# Patient Record
Sex: Male | Born: 1965 | Race: White | Hispanic: No | Smoking: Current every day smoker
Health system: Southern US, Community
[De-identification: ages and names within clinical notes are randomized; demographics above are authoritative.]

## PROBLEM LIST (undated history)

## (undated) DIAGNOSIS — F209 Schizophrenia, unspecified: Secondary | ICD-10-CM

## (undated) DIAGNOSIS — I10 Essential (primary) hypertension: Secondary | ICD-10-CM

## (undated) DIAGNOSIS — E7800 Pure hypercholesterolemia, unspecified: Secondary | ICD-10-CM

## (undated) DIAGNOSIS — F419 Anxiety disorder, unspecified: Secondary | ICD-10-CM

---

## 2013-08-09 ENCOUNTER — Emergency Department: Payer: Self-pay | Admitting: Emergency Medicine

## 2013-08-12 LAB — BETA STREP CULTURE(ARMC)

## 2013-08-15 ENCOUNTER — Emergency Department: Payer: Self-pay | Admitting: Emergency Medicine

## 2013-09-11 ENCOUNTER — Emergency Department: Payer: Self-pay | Admitting: Emergency Medicine

## 2013-09-11 LAB — URINALYSIS, COMPLETE
BILIRUBIN, UR: NEGATIVE
Bacteria: NONE SEEN
Glucose,UR: NEGATIVE mg/dL (ref 0–75)
LEUKOCYTE ESTERASE: NEGATIVE
Nitrite: NEGATIVE
PH: 5 (ref 4.5–8.0)
Protein: NEGATIVE
RBC,UR: 1 /HPF (ref 0–5)
SQUAMOUS EPITHELIAL: NONE SEEN
Specific Gravity: 1.017 (ref 1.003–1.030)

## 2013-09-11 LAB — COMPREHENSIVE METABOLIC PANEL
AST: 61 U/L — AB (ref 15–37)
Albumin: 4 g/dL (ref 3.4–5.0)
Alkaline Phosphatase: 121 U/L — ABNORMAL HIGH
Anion Gap: 6 — ABNORMAL LOW (ref 7–16)
BUN: 10 mg/dL (ref 7–18)
Bilirubin,Total: 0.7 mg/dL (ref 0.2–1.0)
CO2: 30 mmol/L (ref 21–32)
Calcium, Total: 9 mg/dL (ref 8.5–10.1)
Chloride: 99 mmol/L (ref 98–107)
Creatinine: 0.81 mg/dL (ref 0.60–1.30)
EGFR (African American): >60
EGFR (Non-African Amer.): >60
Glucose: 76 mg/dL (ref 65–99)
Osmolality: 268 (ref 275–301)
POTASSIUM: 4.1 mmol/L (ref 3.5–5.1)
SGPT (ALT): 33 U/L (ref 12–78)
SODIUM: 135 mmol/L — AB (ref 136–145)
Total Protein: 7.5 g/dL (ref 6.4–8.2)

## 2013-09-11 LAB — ETHANOL: Ethanol: <3 mg/dL

## 2013-09-11 LAB — SALICYLATE LEVEL: Salicylates, Serum: 3.4 mg/dL — ABNORMAL HIGH

## 2013-09-11 LAB — DRUG SCREEN, URINE

## 2013-09-11 LAB — CBC
HCT: 42.6 % (ref 40.0–52.0)
HGB: 14.1 g/dL (ref 13.0–18.0)
MCH: 30.3 pg (ref 26.0–34.0)
MCHC: 33 g/dL (ref 32.0–36.0)
MCV: 92 fL (ref 80–100)
Platelet: 284 10*3/uL (ref 150–440)
RBC: 4.64 10*6/uL (ref 4.40–5.90)
RDW: 14.5 % (ref 11.5–14.5)
WBC: 8.9 10*3/uL (ref 3.8–10.6)

## 2013-09-11 LAB — ACETAMINOPHEN LEVEL

## 2013-09-15 ENCOUNTER — Emergency Department: Payer: Self-pay | Admitting: Emergency Medicine

## 2013-09-15 LAB — URINALYSIS, COMPLETE
BACTERIA: NONE SEEN
BLOOD: NEGATIVE
Bilirubin,UR: NEGATIVE
Glucose,UR: NEGATIVE mg/dL (ref 0–75)
KETONE: NEGATIVE
Leukocyte Esterase: NEGATIVE
Nitrite: NEGATIVE
PH: 8 (ref 4.5–8.0)
Protein: NEGATIVE
RBC, UR: NONE SEEN /HPF (ref 0–5)
SPECIFIC GRAVITY: 1.008 (ref 1.003–1.030)
SQUAMOUS EPITHELIAL: NONE SEEN
WBC UR: NONE SEEN /HPF (ref 0–5)

## 2013-09-15 LAB — DRUG SCREEN, URINE
Amphetamines, Ur Screen: NEGATIVE (ref ?–1000)
Barbiturates, Ur Screen: NEGATIVE (ref ?–200)
Benzodiazepine, Ur Scrn: NEGATIVE (ref ?–200)
Cannabinoid 50 Ng, Ur ~~LOC~~: NEGATIVE (ref ?–50)
Cocaine Metabolite,Ur ~~LOC~~: NEGATIVE (ref ?–300)
MDMA (ECSTASY) UR SCREEN: NEGATIVE (ref ?–500)
METHADONE, UR SCREEN: NEGATIVE (ref ?–300)
OPIATE, UR SCREEN: NEGATIVE (ref ?–300)
Phencyclidine (PCP) Ur S: NEGATIVE (ref ?–25)
TRICYCLIC, UR SCREEN: NEGATIVE (ref ?–1000)

## 2013-09-15 LAB — ETHANOL
Ethanol %: <0.003 % (ref 0.000–0.080)
Ethanol: <3 mg/dL

## 2013-09-15 LAB — COMPREHENSIVE METABOLIC PANEL
ALT: 31 U/L (ref 12–78)
ANION GAP: 3 — AB (ref 7–16)
Albumin: 3.8 g/dL (ref 3.4–5.0)
Alkaline Phosphatase: 133 U/L — ABNORMAL HIGH
BUN: 7 mg/dL (ref 7–18)
Bilirubin,Total: 0.6 mg/dL (ref 0.2–1.0)
CALCIUM: 9.2 mg/dL (ref 8.5–10.1)
CO2: 31 mmol/L (ref 21–32)
CREATININE: 0.87 mg/dL (ref 0.60–1.30)
Chloride: 99 mmol/L (ref 98–107)
EGFR (Non-African Amer.): >60
Glucose: 95 mg/dL (ref 65–99)
Osmolality: 264 (ref 275–301)
Potassium: 4.3 mmol/L (ref 3.5–5.1)
SGOT(AST): 33 U/L (ref 15–37)
Sodium: 133 mmol/L — ABNORMAL LOW (ref 136–145)
Total Protein: 7.6 g/dL (ref 6.4–8.2)

## 2013-09-15 LAB — CBC
HCT: 44.5 % (ref 40.0–52.0)
HGB: 14.4 g/dL (ref 13.0–18.0)
MCH: 29.6 pg (ref 26.0–34.0)
MCHC: 32.2 g/dL (ref 32.0–36.0)
MCV: 92 fL (ref 80–100)
Platelet: 350 10*3/uL (ref 150–440)
RBC: 4.85 10*6/uL (ref 4.40–5.90)
RDW: 14 % (ref 11.5–14.5)
WBC: 6.1 10*3/uL (ref 3.8–10.6)

## 2013-09-15 LAB — ACETAMINOPHEN LEVEL

## 2013-09-15 LAB — SALICYLATE LEVEL: Salicylates, Serum: 2.4 mg/dL

## 2013-09-17 ENCOUNTER — Emergency Department: Payer: Self-pay | Admitting: Emergency Medicine

## 2013-09-17 LAB — URINALYSIS, COMPLETE
BLOOD: NEGATIVE
Bacteria: NONE SEEN
Bilirubin,UR: NEGATIVE
GLUCOSE, UR: NEGATIVE mg/dL (ref 0–75)
Leukocyte Esterase: NEGATIVE
Nitrite: NEGATIVE
PH: 6 (ref 4.5–8.0)
PROTEIN: NEGATIVE
SQUAMOUS EPITHELIAL: NONE SEEN
Specific Gravity: 1.012 (ref 1.003–1.030)
WBC UR: 2 /HPF (ref 0–5)

## 2013-09-17 LAB — DRUG SCREEN, URINE

## 2013-09-17 LAB — CBC
HCT: 43.5 % (ref 40.0–52.0)
HGB: 14.2 g/dL (ref 13.0–18.0)
MCH: 30.3 pg (ref 26.0–34.0)
MCHC: 32.6 g/dL (ref 32.0–36.0)
MCV: 93 fL (ref 80–100)
Platelet: 365 10*3/uL (ref 150–440)
RBC: 4.69 10*6/uL (ref 4.40–5.90)
RDW: 13.9 % (ref 11.5–14.5)
WBC: 7.4 10*3/uL (ref 3.8–10.6)

## 2013-09-17 LAB — ETHANOL
Ethanol %: <0.003 % (ref 0.000–0.080)
Ethanol: <3 mg/dL

## 2013-09-17 LAB — COMPREHENSIVE METABOLIC PANEL
ALBUMIN: 3.7 g/dL (ref 3.4–5.0)
ALK PHOS: 124 U/L — AB
Anion Gap: 5 — ABNORMAL LOW (ref 7–16)
BUN: 8 mg/dL (ref 7–18)
Bilirubin,Total: 0.6 mg/dL (ref 0.2–1.0)
CALCIUM: 8.8 mg/dL (ref 8.5–10.1)
CO2: 25 mmol/L (ref 21–32)
Chloride: 101 mmol/L (ref 98–107)
Creatinine: 0.88 mg/dL (ref 0.60–1.30)
EGFR (African American): >60
Glucose: 96 mg/dL (ref 65–99)
OSMOLALITY: 261 (ref 275–301)
POTASSIUM: 4 mmol/L (ref 3.5–5.1)
SGOT(AST): 30 U/L (ref 15–37)
SGPT (ALT): 29 U/L (ref 12–78)
SODIUM: 131 mmol/L — AB (ref 136–145)
Total Protein: 7.4 g/dL (ref 6.4–8.2)

## 2013-09-17 LAB — ACETAMINOPHEN LEVEL

## 2013-09-17 LAB — SALICYLATE LEVEL: SALICYLATES, SERUM: 3.2 mg/dL — AB

## 2014-06-06 ENCOUNTER — Emergency Department (HOSPITAL_COMMUNITY)
Admission: EM | Admit: 2014-06-06 | Discharge: 2014-06-07 | Disposition: A | Discharge status: Home / Self Care | Payer: Self-pay | Attending: Emergency Medicine | Admitting: Emergency Medicine

## 2014-06-06 ENCOUNTER — Encounter (HOSPITAL_COMMUNITY): Payer: Self-pay

## 2014-06-06 DIAGNOSIS — R440 Auditory hallucinations: Secondary | ICD-10-CM | POA: Insufficient documentation

## 2014-06-06 DIAGNOSIS — Z880 Allergy status to penicillin: Secondary | ICD-10-CM | POA: Insufficient documentation

## 2014-06-06 DIAGNOSIS — Z9114 Patient's other noncompliance with medication regimen: Secondary | ICD-10-CM | POA: Insufficient documentation

## 2014-06-06 DIAGNOSIS — F200 Paranoid schizophrenia: Secondary | ICD-10-CM

## 2014-06-06 DIAGNOSIS — R441 Visual hallucinations: Secondary | ICD-10-CM | POA: Insufficient documentation

## 2014-06-06 LAB — CBC
HEMATOCRIT: 45.9 % (ref 39.0–52.0)
HEMOGLOBIN: 15.6 g/dL (ref 13.0–17.0)
MCH: 31 pg (ref 26.0–34.0)
MCHC: 34 g/dL (ref 30.0–36.0)
MCV: 91.3 fL (ref 78.0–100.0)
Platelets: 297 10*3/uL (ref 150–400)
RBC: 5.03 MIL/uL (ref 4.22–5.81)
RDW: 13.3 % (ref 11.5–15.5)
WBC: 8.6 10*3/uL (ref 4.0–10.5)

## 2014-06-06 LAB — COMPREHENSIVE METABOLIC PANEL
ALT: 24 U/L (ref 0–53)
ANION GAP: 8 (ref 5–15)
AST: 26 U/L (ref 0–37)
Albumin: 4.6 g/dL (ref 3.5–5.2)
Alkaline Phosphatase: 105 U/L (ref 39–117)
BILIRUBIN TOTAL: 0.6 mg/dL (ref 0.3–1.2)
BUN: 14 mg/dL (ref 6–23)
CALCIUM: 9.4 mg/dL (ref 8.4–10.5)
CO2: 28 mmol/L (ref 19–32)
Chloride: 103 mmol/L (ref 96–112)
Creatinine, Ser: 1.02 mg/dL (ref 0.50–1.35)
GFR calc Af Amer: >90 mL/min (ref >90)
GFR, EST NON AFRICAN AMERICAN: 85 mL/min — AB (ref >90)
Glucose, Bld: 147 mg/dL — ABNORMAL HIGH (ref 70–99)
POTASSIUM: 3.9 mmol/L (ref 3.5–5.1)
Sodium: 139 mmol/L (ref 135–145)
TOTAL PROTEIN: 7.3 g/dL (ref 6.0–8.3)

## 2014-06-06 LAB — ACETAMINOPHEN LEVEL: Acetaminophen (Tylenol), Serum: <10 ug/mL — ABNORMAL LOW (ref 10–30)

## 2014-06-06 LAB — RAPID URINE DRUG SCREEN, HOSP PERFORMED
Amphetamines: NOT DETECTED
Barbiturates: NOT DETECTED
Benzodiazepines: NOT DETECTED
Cocaine: NOT DETECTED
OPIATES: NOT DETECTED
Tetrahydrocannabinol: NOT DETECTED

## 2014-06-06 LAB — ETHANOL: Alcohol, Ethyl (B): <5 mg/dL (ref 0–9)

## 2014-06-06 LAB — SALICYLATE LEVEL: Salicylate Lvl: <4 mg/dL (ref 2.8–20.0)

## 2014-06-06 MED ORDER — NICOTINE 21 MG/24HR TD PT24: 21.0000 mg | MEDICATED_PATCH | Freq: Every day | TRANSDERMAL | Status: DC

## 2014-06-06 MED ORDER — LORAZEPAM 1 MG PO TABS: 1.0000 mg | ORAL_TABLET | Freq: Three times a day (TID) | ORAL | Status: DC | PRN

## 2014-06-06 MED ORDER — ZOLPIDEM TARTRATE 5 MG PO TABS: 5.0000 mg | ORAL_TABLET | Freq: Every evening | ORAL | Status: DC | PRN

## 2014-06-06 MED ORDER — ALUM & MAG HYDROXIDE-SIMETH 200-200-20 MG/5ML PO SUSP: 30.0000 mL | ORAL | Status: DC | PRN

## 2014-06-06 MED ORDER — PERPHENAZINE 8 MG PO TABS
8.0000 mg | ORAL_TABLET | Freq: Two times a day (BID) | ORAL | Status: DC
  Administered 2014-06-06 – 2014-06-07 (×3): 8 mg via ORAL
  Filled 2014-06-06 (×5): qty 1

## 2014-06-06 MED ORDER — ONDANSETRON HCL 4 MG PO TABS: 4.0000 mg | ORAL_TABLET | Freq: Three times a day (TID) | ORAL | Status: DC | PRN

## 2014-06-06 MED ORDER — NICOTINE POLACRILEX 2 MG MT GUM: 2.0000 mg | CHEWING_GUM | OROMUCOSAL | Status: DC | PRN

## 2014-06-06 MED ORDER — BENZTROPINE MESYLATE 1 MG PO TABS
1.0000 mg | ORAL_TABLET | Freq: Two times a day (BID) | ORAL | Status: DC
  Administered 2014-06-06 – 2014-06-07 (×3): 1 mg via ORAL
  Filled 2014-06-06 (×3): qty 1

## 2014-06-06 MED ORDER — ACETAMINOPHEN 325 MG PO TABS: 650.0000 mg | ORAL_TABLET | ORAL | Status: DC | PRN

## 2014-06-06 MED ORDER — TRAZODONE HCL 100 MG PO TABS
100.0000 mg | ORAL_TABLET | Freq: Every day | ORAL | Status: DC
  Administered 2014-06-06: 100 mg via ORAL
  Filled 2014-06-06: qty 1

## 2014-06-06 MED ORDER — HYDROXYZINE HCL 25 MG PO TABS
50.0000 mg | ORAL_TABLET | Freq: Three times a day (TID) | ORAL | Status: DC | PRN
Start: 2014-06-06 — End: 2014-06-07

## 2014-06-06 NOTE — Progress Notes (Addendum)
  CARE MANAGEMENT ED NOTE 06/06/2014  Patient:  Kenneth [name],Kenneth [name]   Account Number:  [id number]  Date Initiated:  06/06/2014  Documentation initiated by:  Edd ArbourGIBBS,Laroy Mustard  Subjective/Objective Assessment:   49 yr old self pay [place] pt hearing voices and seeing things, pt denies SI at this time Pt confirmed with CM he was recently released from jail 5 days ago and has no pcp        Subjective/Objective Assessment Detail:   no pcp listed in EPIC  PMH: paranoid shizophrenia  Quality collaborative recommendations; Injectable medicines weekly and community resources  No home medicine listed     Action/Plan:   see resources listed below given resources for [place]   Action/Plan Detail:   Anticipated DC Date:       Status Recommendation to Physician:   Result of Recommendation:    Other ED Services  Consult Working Plan    DC Planning Services  Outpatient Services - Pt will follow up  Other  PCP issues    Choice offered to / List presented to:            Status of service:  Completed, signed off  ED Comments:   ED Comments Detail:  [name] Location: [place], [place]- [id number] Contact Phone: [phone] Services: Dental Care Services, Enabling Services, Obstetrical and Gynecological Care, Primary Medical Care Remarks: Year round  [name] Location: [place], [place] -- [id number] Contact Phone: [phone] Services: Child, Teen, Adult and [facility], [facility], Wellness, Prenatal Care and [employer] Planning, Physical Exams (school, employment, sports, nursing home, etc.), Chronic Illness, Minor Trauma, Immunizations, Flu Shots, Stage managerLaboratory Services On-Site, [place] Remarks: Community Health, Urban [facility], [facility], Year-Round, Full-Time (open 52 hours per week)  [facility] Location: [place], [place]- [id number] Contact Phone: [phone] Services: Medical Services Remarks: Hours Of Operation: Tuesday:  4:15 PM to 8:00 PM, Thursday: 4:15 PM to 8:00 PM; Eye Clinic Hours: Wednesday 9:00AM - 12:00PM, Thursday 1:00PM - 5:00PM - See more at: [email or web address]_care/[email or web address]#[email or web address]  CM discussed and provided written information for self pay pcps, importance of pcp for f/u care, www.needymeds.org, www.goodrx.com, discounted pharmacies and other [place] resources such as [facility], [facility], affordable care act, financial assistance, DSS and  health department  Reviewed resources for [place] self pay pcps like [facility], family medicine at [address], [facility], general medical clinics, [facility] plus others, medication resources, [facility] out patient pharmacies and housing Pt voiced understanding and appreciation of resources provided  Pt informed Cm he had "[facility] at [facility]" " I can't go back there" " I'[name] trying to get to [place]." after pt discussed [facility] and [facility] Pt reports [facility] an appointment at [facility] [place] "today but missed it"

## 2014-06-06 NOTE — BH Assessment (Signed)
Assessment Note  Kenneth MasseBenjamin M [name] is an 49 y.o. male with a history of paranoid schizophrenia who [facility]. Patient states he has been experiencing hallucinations over the past 3 days. He has been noncompliant with his medications over this period of time. Patient stopped taking medications without reasoning.  He states he was just released from prison 5 days ago after being incarcerated for 16 years. He states that he saw a psychiatrist while incarcerated. He endorses active auditory hallucinations telling him that this is a "black mafia hospital". The voices tell him to harm himself and others. Patient unable to distinguish auditory hallucinations vs. reality. Patient sts that he feels more so paranoid if anything. He is afraid that someone with attack him. He states that he drank 9 beers 2 days ago. He also used marijuana. He denies suicidal or homicidal thoughts currently. Patient reports that he has a hx of suicidal attempt (overdose). Although he denies current HI patient felt homicidal yesterday (no plan/victim).  Patient sts that he was hospitalized for mental health reasons in the past 1x.   Axis I: Paranoid Schizophrenia Axis II: Deferred Axis III: History reviewed. No pertinent past medical history. Axis IV: other psychosocial or environmental problems, problems related to social environment, problems with access to health care services and problems with primary support group Axis V: 31-40 impairment in reality testing  Past Medical History: History reviewed. No pertinent past medical history.  History reviewed. No pertinent past surgical history.  Family History: History reviewed. No pertinent family history.  Social History:  reports that he has never smoked. He does not have any smokeless tobacco history on file. He reports that he does not drink alcohol. His drug history is not on file.  Additional Social History:  Alcohol / Drug Use Pain  Medications: SEE MAR Prescriptions: SEE MAR Over the Counter: SEE MAR History of alcohol / drug use?: Yes Longest period of sobriety (when/how long): 16 yrs  Substance #1 Name of Substance 1: Alcohol  1 - Age of First Use: "I was young" 1 - Amount (size/oz): several beers 1 - Frequency: 1x in the past several days (patient incarcerated for the past 16 yrs) 1 - Duration: on-going  1 - Last Use / Amount: 06/04/2014 patient reports drinking 9 beers  Substance #2 Name of Substance 2: THC  2 - Age of First Use: 49 yrs old  2 - Amount (size/oz): varies 2 - Frequency: 1x in the past several days (patient incarcerated for the past 16 yrs) 2 - Duration: on-going  2 - Last Use / Amount: 06/04/2014   CIWA: CIWA-Ar BP: 140/91 mmHg Pulse Rate: 98 COWS:    Allergies:  Allergies  Allergen Reactions  . Haldol [Haloperidol] Other (See Comments)    "lock up"  . Loxapine     "Locks ups"  . Penicillins Other (See Comments)    Neck swells    Home Medications:  (Not in a hospital admission)  OB/GYN Status:  No LMP for male patient.  General Assessment Data [facility] of [facility]: [facility] ([facility]) Is this a Tele or Face-to-Face Assessment?: Face-to-Face Is this an Initial Assessment or a Re-assessment for this encounter?: Initial Assessment Living Arrangements: Other (Comment) Stage manager([facility]) Can pt return to current living arrangement?: No Admission Status: Voluntary Is patient capable of signing voluntary admission?: Yes Transfer from: Acute Hospital Referral Source: Self/Family/Friend     [facility] Living Arrangements: Other (Comment) Stage manager([facility]) Name of Psychiatrist:  (  None reported ) Name of Therapist:  (None reported )  Education Status Is patient currently in school?: No  Risk to self with the past 6 months Suicidal Ideation: No Suicidal Intent: No Is patient at risk for suicide?: No Suicidal Plan?: No Access to Means: No What has been your use of  drugs/alcohol within the last 12 months?:  (patient reports recent THC and alcohol use) Previous Attempts/Gestures: Yes How many times?:  (1x-overdose (2010)) Other Self Harm Risks:  (cutting by history ) Triggers for Past Attempts: Other (Comment) (auditory hallucinations ) Intentional Self Injurious Behavior: Cutting (cutting by hx) Comment - Self Injurious Behavior:  (cutting by hx) Family Suicide History:  (mother-depression and Bipolar ) Recent stressful life event(s): Other (Comment) (pt released from prison last Thursday after serving 81yrs) Persecutory voices/beliefs?: No Depression: Yes Depression Symptoms: Loss of interest in usual pleasures, Fatigue, Despondent Substance abuse history and/or treatment for substance abuse?: No Suicide prevention information given to non-admitted patients: Not applicable  Risk to Others within the past 6 months Homicidal Ideation: No-Not Currently/Within Last 6 Months (Patient reports HI yesterday 06/05/2013) Thoughts of Harm to Others: No-Not Currently Present/Within Last 6 Months Current Homicidal Intent: No Current Homicidal Plan: No Access to Homicidal Means: No Identified Victim:  (patient denies victim) History of harm to others?:  ("I will harm someone if they try to harm me") Assessment of Violence: None Noted Violent Behavior Description:  (patient calm and cooperative .) Does patient have access to weapons?: No Criminal Charges Pending?: No Describe Pending Criminal Charges:  (released from prison last Thursday after serving 16 yrs ) Does patient have a court date: No  Psychosis Hallucinations: Auditory, With command ("Voices tell me to harm others and myself") Delusions: None noted  Mental Status Report Appear/Hygiene: Disheveled Eye Contact: Good Motor Activity: Freedom of movement Speech: Logical/coherent Level of Consciousness: Alert Mood: Depressed Affect: Appropriate to circumstance Anxiety Level: None Thought  Processes: Coherent, Relevant Judgement: Impaired Orientation: Person, Place, Situation, Time Obsessive Compulsive Thoughts/Behaviors: None  Cognitive Functioning Concentration: Normal Memory: Remote Impaired, Recent Intact IQ: Average Insight: Poor Impulse Control: Poor Appetite: Good Weight Loss:  (none reported ) Weight Gain:  (none reported ) Sleep: Decreased Total Hours of Sleep:  ("I haven't slept in 3 days") Vegetative Symptoms: None  ADLScreening [facility] Assessment Services) Patient's cognitive ability adequate to safely complete daily activities?: Yes Patient able to express need for assistance with ADLs?: Yes Independently performs ADLs?: Yes (appropriate for developmental age)  Prior Inpatient Therapy Prior Inpatient Therapy: Yes Prior Therapy Dates:  (patient unable to recall date; pt reports 1 prior hospitaliz) Prior Therapy Facilty/Provider(s):  (patient unable to recall ) Reason for Treatment:  (overdose and psychosis; medication managment )  Prior Outpatient Therapy Prior Outpatient Therapy: No Prior Therapy Dates:  (n/a) Prior Therapy Facilty/Provider(s):  (n/a) Reason for Treatment:  (n/a)  ADL Screening (condition at time of admission) Patient's cognitive ability adequate to safely complete daily activities?: Yes Is the patient deaf or have difficulty hearing?: No Does the patient have difficulty seeing, even when wearing glasses/contacts?: No Does the patient have difficulty concentrating, remembering, or making decisions?: Yes Patient able to express need for assistance with ADLs?: Yes Does the patient have difficulty dressing or bathing?: No Independently performs ADLs?: Yes (appropriate for developmental age) Does the patient have difficulty walking or climbing stairs?: No Weakness of Legs: None Weakness of Arms/Hands: None  Home Assistive Devices/Equipment Home Assistive Devices/Equipment: None    Abuse/Neglect Assessment (Assessment to be  complete while patient is alone) Physical Abuse: Denies Verbal Abuse: Denies Sexual Abuse: Denies Exploitation of patient/patient's resources: Denies Self-Neglect: Denies Values / Beliefs Cultural Requests During Hospitalization: None Spiritual Requests During Hospitalization: None   Advance Directives (For Healthcare) Does patient have an advance directive?: No Would patient like information on creating an advanced directive?: No - patient declined information    Additional Information 1:1 In Past 12 Months?: No CIRT Risk: No Elopement Risk: No Does patient have medical clearance?: Yes     Disposition:  Disposition Initial Assessment Completed for this Encounter: Yes Disposition of Patient: Other dispositions (Pending psych consult by Julieanne Cotton, NP and Dr. Jannifer Franklin)  On Site Evaluation by:   Reviewed with Physician:    Melynda Ripple [facility] 06/06/2014 9:25 AM

## 2014-06-06 NOTE — ED Provider Notes (Signed)
CSN: [id number]     Arrival date & time 06/06/14  0053 History   First MD Initiated Contact with Patient 06/06/14 0126     Chief Complaint  Patient presents with  . Hallucinations    (Consider location/radiation/quality/duration/timing/severity/associated sxs/prior Treatment) HPI Comments: Patient is a 49 year old male with a history of paranoid schizophrenia who presents to the emergency department for further evaluation of auditory and visual hallucinations. Patient states he has been experiencing hallucinations over the past 3 days. He has been noncompliant with his medications over this period of time. He states he was just released from prison 5 days ago after being incarcerated for 5 years. He states that he saw a psychiatrist while incarcerated. He endorses active auditory hallucinations telling him that this is a "black mafia hospital". He states that he drank beer 2 days ago. She denies the use of illicit drugs as well as any suicidal or homicidal thoughts. He denies command hallucinations.  The history is provided by the patient. No language interpreter was used.    History reviewed. No pertinent past medical history. History reviewed. No pertinent past surgical history. History reviewed. No pertinent family history. History  Substance Use Topics  . Smoking status: Never Smoker   . Smokeless tobacco: Not on file  . Alcohol Use: No    Review of Systems  Psychiatric/Behavioral: Positive for hallucinations and behavioral problems. Negative for suicidal ideas.  All other systems reviewed and are negative.   Allergies  Haldol; Loxapine; and Penicillins  Home Medications   Prior to Admission medications   Not on File   BP 140/91 mmHg  Pulse 98  Temp(Src) 98.1 F (36.7 C) (Oral)  Resp 20  SpO2 97%   Physical Exam  Constitutional: He is oriented to person, place, and time. He appears well-developed and well-nourished. No distress.  Nontoxic/nonseptic appearing  HENT:   Head: Normocephalic and atraumatic.  Eyes: Conjunctivae and EOM are normal. No scleral icterus.  Neck: Normal range of motion.  Pulmonary/Chest: Effort normal. No respiratory distress.  Respirations even and unlabored  Musculoskeletal: Normal range of motion.  Neurological: He is alert and oriented to person, place, and time. He exhibits normal muscle tone. Coordination normal.  GCS 15. Patient speaks in full goal oriented sentences. He answers questions appropriately and follows simple commands. No focal neurologic deficits noted.  Skin: Skin is warm and dry. No rash noted. He is not diaphoretic. No erythema. No pallor.  Psychiatric: His speech is normal. His mood appears anxious. He is agitated and actively hallucinating. Thought content is paranoid. He expresses no homicidal and no suicidal ideation. He expresses no suicidal plans and no homicidal plans.  Nursing note and vitals reviewed.   ED Course  Procedures (including critical care time) Labs Review Labs Reviewed  ACETAMINOPHEN LEVEL - Abnormal; Notable for the following:    Acetaminophen (Tylenol), Serum <10.0 (*)    All other components within normal limits  COMPREHENSIVE METABOLIC PANEL - Abnormal; Notable for the following:    Glucose, Bld 147 (*)    GFR calc non Af Amer 85 (*)    All other components within normal limits  CBC  ETHANOL  SALICYLATE LEVEL  URINE RAPID DRUG SCREEN (HOSP PERFORMED)    Imaging Review No results found.   EKG Interpretation None      MDM   Final diagnoses:  Auditory hallucinations  Visual hallucinations  Noncompliance with medication regimen    49 year old male who endorses a history of paranoid schizophrenia presents to the  emergency department for further evaluation of auditory and visual hallucinations. He states that he has been off of his medications for the last 3 days and has also been feeling more paranoid. He denies SI/HI. He has been medically cleared and is currently  pending TTS evaluation. Disposition to be determined by oncoming ED provider. Psych hold orders placed.   Filed Vitals:   06/06/14 0114 06/06/14 0641  BP: 164/105 140/91  Pulse: 101 98  Temp: 97.8 F (36.6 C) 98.1 F (36.7 C)  TempSrc:  Oral  Resp: 24 20  SpO2: 98% 97%       Antony Madura, PA-C 06/06/14 1610  Derwood Kaplan, MD 06/07/14 2201

## 2014-06-06 NOTE — BH Assessment (Addendum)
[facility] Assessment Progress Note  Per Thedore MinsMojeed Akintayo, MD, pt requires psychiatric hospitalization.  This Clinical research associatewriter has spoken to Berneice Heinrichina Tate, RN, [facility] to ask that [facility] with decision pending at this time.  The following facilities have been contacted in an effort to place this pt, with results as noted:  *[facility]: beds are available and they have agreed to consider pt for admission; decision pending at this time *[facility]: beds are available; referral faxed with decision pending *[facility]: beds are available; referral faxed with decision pending *[facility]: beds are available; referral faxed with decision pending *[facility]: call placed at 12:58; rolled to voice mail; left message *[facility]: beds are available; referral faxed with decision pending   Kenneth Canninghomas Kenneth Woolston, MA Triage Specialist 06/06/2014 @ 16:35

## 2014-06-06 NOTE — Consult Note (Signed)
[facility] Face-to-Face Psychiatry Consult   Reason for Consult:  Hallucinations, Paranoia Referring Physician:  EDP Patient Identification: Kenneth [name] MRN:  [id number] Principal Diagnosis: Schizophrenia, paranoid type Diagnosis:   Patient Active Problem List   Diagnosis Date Noted  . Schizophrenia, paranoid type [F20.0] 06/06/2014    Total Time spent with patient: 45 minutes  Subjective:   Kenneth [name] is a 49 y.o. male patient admitted with Paranoia and Auditory/Visual Hallucination  HPI:  Caucasian male was assessed this am, brought in by [facility] for endorsing auditory and visual hallucination.  Patient has a hx of Schizophrenia and was receiving treatment while incarcerated.  Patient reported stopping his Psychotropic medication for three days.  Patient did not give reasons for stopping his medications.  Patient is hearing voices telling him to hurt self or somebody.   Patient stated that he saw "GOD dying last night" and stated that God is a beautiful man.   Patient spent 16 years in the Prison system and is homeless now.  He is unemployed and reports no source of income.  Patient reported poor sleep and appetite.  He reports no family or assistance since he left the prison.  We have accepted patient for admission and will be looking for bed at ant facility with available inpatient Psychiatric unit.    He denied SI/HI and we have restarted his medication.  HPI Elements:   Location:  Chronic paranoid Schizophrenia, . Quality:  C/O Auditory/visual hallucination.. Severity:  severe. Timing:  on going, stopped taking his medications. Duration:  Chronic. Context:  Brought in for evaluation of AVH.Marland Kitchen  Past Medical History: History reviewed. No pertinent past medical history. History reviewed. No pertinent past surgical history. Family History: History reviewed. No pertinent family history. Social History:  History  Alcohol Use No     History  Drug Use Not on file    History    Social History  . Marital Status: Married    Spouse Name: N/A    Number of Children: N/A  . Years of Education: N/A   Social History Main Topics  . Smoking status: Never Smoker   . Smokeless tobacco: None  . Alcohol Use: No  . Drug Use: None  . Sexual Activity: None   Other Topics Concern  . None   Social History Narrative  . None   Additional Social History:    Pain Medications: SEE MAR Prescriptions: SEE MAR Over the Counter: SEE MAR History of alcohol / drug use?: Yes Longest period of sobriety (when/how long): 16 yrs  Name of Substance 1: Alcohol  1 - Age of First Use: "I was young" 1 - Amount (size/oz): several beers 1 - Frequency: 1x in the past several days (patient incarcerated for the past 16 yrs) 1 - Duration: on-going  1 - Last Use / Amount: 06/04/2014 patient reports drinking 9 beers  Name of Substance 2: THC  2 - Age of First Use: 49 yrs old  2 - Amount (size/oz): varies 2 - Frequency: 1x in the past several days (patient incarcerated for the past 16 yrs) 2 - Duration: on-going  2 - Last Use / Amount: 06/04/2014                  Allergies:   Allergies  Allergen Reactions  . Haldol [Haloperidol] Other (See Comments)    "lock up"  . Loxapine     "Locks ups"  . Penicillins Other (See Comments)    Neck swells  Vitals: Blood pressure 107/79, pulse 93, temperature 98.4 F (36.9 C), temperature source Oral, resp. rate 20, SpO2 98 %.  Risk to Self: Suicidal Ideation: No Suicidal Intent: No Is patient at risk for suicide?: No Suicidal Plan?: No Access to Means: No What has been your use of drugs/alcohol within the last 12 months?:  (patient reports recent THC and alcohol use) How many times?:  (1x-overdose (2010)) Other Self Harm Risks:  (cutting by history ) Triggers for Past Attempts: Other (Comment) (auditory hallucinations ) Intentional Self Injurious Behavior: Cutting (cutting by hx) Comment - Self Injurious Behavior:  (cutting by  hx) Risk to Others: Homicidal Ideation: No-Not Currently/Within Last 6 Months (Patient reports HI yesterday 06/05/2013) Thoughts of Harm to Others: No-Not Currently Present/Within Last 6 Months Current Homicidal Intent: No Current Homicidal Plan: No Access to Homicidal Means: No Identified Victim:  (patient denies victim) History of harm to others?:  ("I will harm someone if they try to harm me") Assessment of Violence: None Noted Violent Behavior Description:  (patient calm and cooperative .) Does patient have access to weapons?: No Criminal Charges Pending?: No Describe Pending Criminal Charges:  (released from prison last Thursday after serving 16 yrs ) Does patient have a court date: No Prior Inpatient Therapy: Prior Inpatient Therapy: Yes Prior Therapy Dates:  (patient unable to recall date; pt reports 1 prior hospitaliz) Prior Therapy Facilty/Provider(s):  (patient unable to recall ) Reason for Treatment:  (overdose and psychosis; medication managment ) Prior Outpatient Therapy: Prior Outpatient Therapy: No Prior Therapy Dates:  (n/a) Prior Therapy Facilty/Provider(s):  (n/a) Reason for Treatment:  (n/a)  Current Facility-Administered Medications  Medication Dose Route Frequency Provider Last Rate Last Dose  . acetaminophen (TYLENOL) tablet 650 mg  650 mg Oral Q4H PRN Antony MaduraKelly Humes, PA-C      . alum & mag hydroxide-simeth (MAALOX/MYLANTA) 200-200-20 MG/5ML suspension 30 mL  30 mL Oral PRN Antony MaduraKelly Humes, PA-C      . benztropine (COGENTIN) tablet 1 mg  1 mg Oral BID Jobina Maita   1 mg at 06/06/14 1210  . hydrOXYzine (ATARAX/VISTARIL) tablet 50 mg  50 mg Oral TID PRN Jacquelyne Quarry      . nicotine polacrilex (NICORETTE) gum 2 mg  2 mg Oral PRN Alianah Lofton      . ondansetron (ZOFRAN) tablet 4 mg  4 mg Oral Q8H PRN Antony MaduraKelly Humes, PA-C      . perphenazine (TRILAFON) tablet 8 mg  8 mg Oral BID Mirta Mally   8 mg at 06/06/14 1206  . traZODone (DESYREL) tablet 100 mg  100 mg Oral  QHS Winola Drum       No current outpatient prescriptions on file.    Musculoskeletal: Strength & Muscle Tone: within normal limits Gait & Station: normal Patient leans: N/A  Psychiatric Specialty Exam:     Blood pressure 107/79, pulse 93, temperature 98.4 F (36.9 C), temperature source Oral, resp. rate 20, SpO2 98 %.There is no height or weight on file to calculate BMI.  General Appearance: Casual  Eye Contact::  Fair  Speech:  Clear and Coherent and Normal Rate  Volume:  Normal  Mood:  Depressed and Hopeless  Affect:  Congruent, Depressed and Flat  Thought Process:  Disorganized  Orientation:  Full (Time, Place, and Person)  Thought Content:  Hallucinations: Auditory Command:  Kill self and people Visual and Paranoid Ideation  Suicidal Thoughts:  No  Homicidal Thoughts:  No  Memory:  Immediate;   Good Recent;  Good Remote;   Good  Judgement:  Fair  Insight:  Fair  Psychomotor Activity:  Normal  Concentration:  Good  Recall:  NA  Fund of Knowledge:Fair  Language: Good  Akathisia:  NA  Handed:  Right  AIMS (if indicated):     Assets:  Desire for Improvement  ADL's:  Intact  Cognition: WNL  Sleep:      Medical Decision Making: Review of Psycho-Social Stressors (1), Review of Medication Regimen & Side Effects (2) and Review of New Medication or Change in Dosage (2)  Problem Points: New problem, with no additional work-up planned (3) and Review of psycho-social stressors (1)  Data Points: Review of medication regiment & side effects (2) Review of new medications or change in dosage (2)  Treatment Plan Summary: Daily contact with patient to assess and evaluate symptoms and progress in treatment, Medication management and Plan Admit for safety and stabilization.  Plan:  Recommend psychiatric Inpatient admission when medically cleared. admit for safety and stabuilization Disposition: Bobbye Charleston   PMHNP-BC 06/06/2014 1:31 PM  Patient seen,  evaluated and I agree with notes by Nurse Practitioner. Thedore Mins, MD

## 2014-06-06 NOTE — ED Notes (Signed)
Pt complains of hearing voices and seeing things, pt denies SI at this time

## 2014-06-06 NOTE — Progress Notes (Signed)
[facility]. Recommendations shared for injectable medication with psychiatrist.   Byrd HesselbachKristen Anthonymichael Munday, LCSW [phone]  ED CSW 06/06/2014 11:24 AM

## 2014-06-06 NOTE — ED Notes (Signed)
Pt alert and orient to person, place and situation when asked what date it was pt stated "two thousand something" - upon question pt on continued auditory hallucinations pt states "once you start hearing voices you always hear voices" however pt admits to improvement in symptoms since being in this facility. Pt cooperative and calm at this time.

## 2014-06-06 NOTE — ED Notes (Signed)
Pt and belongings wanded by security. One pt belonging bag

## 2014-06-06 NOTE — B[facility] Counselor (Signed)
Writer informed TTS Toyjka of the consult.

## 2014-06-07 DIAGNOSIS — R441 Visual hallucinations: Secondary | ICD-10-CM

## 2014-06-07 DIAGNOSIS — R440 Auditory hallucinations: Secondary | ICD-10-CM

## 2014-06-07 MED ORDER — BENZTROPINE MESYLATE 1 MG PO TABS: 1.0000 mg | ORAL_TABLET | Freq: Two times a day (BID) | ORAL | Status: DC

## 2014-06-07 MED ORDER — TRAZODONE HCL 100 MG PO TABS: 100.0000 mg | ORAL_TABLET | Freq: Every day | ORAL | Status: DC

## 2014-06-07 MED ORDER — PERPHENAZINE 8 MG PO TABS: 8.0000 mg | ORAL_TABLET | Freq: Two times a day (BID) | ORAL | Status: DC

## 2014-06-07 NOTE — Progress Notes (Signed)
Pt provided with outpatient resources including [facility]. Patient given prescriptions. Patient also provided homeless resources. Patient dc with bus pass.   Byrd HesselbachKristen Estil Vallee, LCSW [phone]  ED CSW 06/07/2014 1:16 PM

## 2014-06-07 NOTE — ED Notes (Signed)
Pt's belongings returned and signed for. AVS, prescriptions, bus pass, and referrals given. Pt in no acute distress and escorted to lobby by [facility].

## 2014-06-07 NOTE — Consult Note (Signed)
[facility] Face-to-Face Psychiatry Discharge Note  Reason for Consult:  Hallucinations, Paranoia Referring Physician:  EDP Patient Identification: Kenneth [name] MRN:  [id number] Principal Diagnosis: Schizophrenia, paranoid type Diagnosis:   Patient Active Problem List   Diagnosis Date Noted  . Schizophrenia, paranoid type [F20.0] 06/06/2014  . Auditory hallucinations [R44.0]   . Visual hallucinations [R44.1]     Total Time spent with patient: 30  Subjective:   Kenneth [name] is a 49 y.o. male patient admitted with Paranoia and Auditory/Visual Hallucination  HPI:  Caucasian male was assessed this am, brought in by [facility] for endorsing auditory and visual hallucination.  Patient has a hx of Schizophrenia and was receiving treatment while incarcerated.  Patient reported stopping his Psychotropic medication for three days.  Patient did not give reasons for stopping his medications.  Patient is hearing voices telling him to hurt self or somebody.   Patient stated that he saw "GOD dying last night" and stated that God is a beautiful man.   Patient spent 16 years in the Prison system and is homeless now.  He is unemployed and reports no source of income.  Patient reported poor sleep and appetite.  He reports no family or assistance since he left the prison.  We have accepted patient for admission and will be looking for bed at ant facility with available inpatient Psychiatric unit.    He denied SI/HI and we have restarted his medication.  Re-evaluated patient today who stated that he is feeling better after starting back on his medications.  He reported good sleep and appetite.  Patient denies SI/HI/AVH.  Patient will be discharge back to his Shelter and will be given prescription and referral to [facility] for his outpatient follow up.  HPI Elements:   Location:  Chronic paranoid Schizophrenia, . Quality:  C/O Auditory/visual hallucination.. Severity:  severe. Timing:  on going, stopped taking his  medications. Duration:  Chronic. Context:  Brought in for evaluation of AVH.Marland Kitchen  Past Medical History: History reviewed. No pertinent past medical history. History reviewed. No pertinent past surgical history. Family History: History reviewed. No pertinent family history. Social History:  History  Alcohol Use No     History  Drug Use Not on file    History   Social History  . Marital Status: Married    Spouse Name: N/A    Number of Children: N/A  . Years of Education: N/A   Social History Main Topics  . Smoking status: Never Smoker   . Smokeless tobacco: None  . Alcohol Use: No  . Drug Use: None  . Sexual Activity: None   Other Topics Concern  . None   Social History Narrative  . None   Additional Social History:    Pain Medications: SEE MAR Prescriptions: SEE MAR Over the Counter: SEE MAR History of alcohol / drug use?: Yes Longest period of sobriety (when/how long): 16 yrs  Name of Substance 1: Alcohol  1 - Age of First Use: "I was young" 1 - Amount (size/oz): several beers 1 - Frequency: 1x in the past several days (patient incarcerated for the past 16 yrs) 1 - Duration: on-going  1 - Last Use / Amount: 06/04/2014 patient reports drinking 9 beers  Name of Substance 2: THC  2 - Age of First Use: 49 yrs old  2 - Amount (size/oz): varies 2 - Frequency: 1x in the past several days (patient incarcerated for the past 16 yrs) 2 - Duration: on-going  2 - Last Use /  Amount: 06/04/2014                  Allergies:   Allergies  Allergen Reactions  . Haldol [Haloperidol] Other (See Comments)    "lock up"  . Loxapine     "Locks ups"  . Penicillins Other (See Comments)    Neck swells    Vitals: Blood pressure 135/78, pulse 86, temperature 97.7 F (36.5 C), temperature source Oral, resp. rate 20, SpO2 98 %.  Risk to Self: Suicidal Ideation: No Suicidal Intent: No Is patient at risk for suicide?: No Suicidal Plan?: No Access to Means: No What has been your  use of drugs/alcohol within the last 12 months?:  (patient reports recent THC and alcohol use) How many times?:  (1x-overdose (2010)) Other Self Harm Risks:  (cutting by history ) Triggers for Past Attempts: Other (Comment) (auditory hallucinations ) Intentional Self Injurious Behavior: Cutting (cutting by hx) Comment - Self Injurious Behavior:  (cutting by hx) Risk to Others: Homicidal Ideation: No-Not Currently/Within Last 6 Months (Patient reports HI yesterday 06/05/2013) Thoughts of Harm to Others: No-Not Currently Present/Within Last 6 Months Current Homicidal Intent: No Current Homicidal Plan: No Access to Homicidal Means: No Identified Victim:  (patient denies victim) History of harm to others?:  ("I will harm someone if they try to harm me") Assessment of Violence: None Noted Violent Behavior Description:  (patient calm and cooperative .) Does patient have access to weapons?: No Criminal Charges Pending?: No Describe Pending Criminal Charges:  (released from prison last Thursday after serving 16 yrs ) Does patient have a court date: No Prior Inpatient Therapy: Prior Inpatient Therapy: Yes Prior Therapy Dates:  (patient unable to recall date; pt reports 1 prior hospitaliz) Prior Therapy Facilty/Provider(s):  (patient unable to recall ) Reason for Treatment:  (overdose and psychosis; medication managment ) Prior Outpatient Therapy: Prior Outpatient Therapy: No Prior Therapy Dates:  (n/a) Prior Therapy Facilty/Provider(s):  (n/a) Reason for Treatment:  (n/a)  Current Facility-Administered Medications  Medication Dose Route Frequency Provider Last Rate Last Dose  . acetaminophen (TYLENOL) tablet 650 mg  650 mg Oral Q4H PRN Antony MaduraKelly Humes, PA-C      . alum & mag hydroxide-simeth (MAALOX/MYLANTA) 200-200-20 MG/5ML suspension 30 mL  30 mL Oral PRN Antony MaduraKelly Humes, PA-C      . benztropine (COGENTIN) tablet 1 mg  1 mg Oral BID Orley Lawry   1 mg at 06/07/14 0954  . hydrOXYzine  (ATARAX/VISTARIL) tablet 50 mg  50 mg Oral TID PRN Ashantae Pangallo      . nicotine polacrilex (NICORETTE) gum 2 mg  2 mg Oral PRN Jabri Blancett      . ondansetron (ZOFRAN) tablet 4 mg  4 mg Oral Q8H PRN Antony MaduraKelly Humes, PA-C      . perphenazine (TRILAFON) tablet 8 mg  8 mg Oral BID Burna Atlas   8 mg at 06/07/14 0954  . traZODone (DESYREL) tablet 100 mg  100 mg Oral QHS Coral Soler   100 mg at 06/06/14 2233   No current outpatient prescriptions on file.    Musculoskeletal: Strength & Muscle Tone: within normal limits Gait & Station: normal Patient leans: N/A  Psychiatric Specialty Exam:     Blood pressure 135/78, pulse 86, temperature 97.7 F (36.5 C), temperature source Oral, resp. rate 20, SpO2 98 %.There is no height or weight on file to calculate BMI.  General Appearance: Casual  Eye Contact::  Fair  Speech:  Clear and Coherent and Normal Rate  Volume:  Normal  Mood:  Depressed and Hopeless  Affect:  Congruent, Depressed and Flat  Thought Process:  Coherent  Orientation:  Full (Time, Place, and Person)  Thought Content:  Doing better at this time since starting back on his medications.  Suicidal Thoughts:  No  Homicidal Thoughts:  No  Memory:  Immediate;   Good Recent;   Good Remote;   Good  Judgement:  Fair  Insight:  Fair  Psychomotor Activity:  Normal  Concentration:  Good  Recall:  NA  Fund of Knowledge:Fair  Language: Good  Akathisia:  NA  Handed:  Right  AIMS (if indicated):     Assets:  Desire for Improvement  ADL's:  Intact  Cognition: WNL  Sleep:      Medical Decision Making: Established Problem, Stable/Improving (1), Review of Medication Regimen & Side Effects (2) and Review of New Medication or Change in Dosage (2)  Problem Points: Established problem, stable/improving (1)  Data Points: Review of medication regiment & side effects (2) Review of new medications or change in dosage (2)  Treatment Plan Summary: Discharge back to his  Shelter  Plan:  Discharge back to his Shelter Disposition: Discharge back to his Shelter  Earney Navy   PMHNP-BC 06/07/2014 11:43 AM  Patient seen, evaluated and I agree with notes by Nurse Practitioner. Thedore Mins, MD

## 2014-06-07 NOTE — ED Notes (Signed)
Patient calm, cooperative, drowsy. Denies SI, HI. Reports continued AVH. Denies feeling anxious. Reports feelings of depression. Reports decreased sleep and appetite.   Encouragement offered. Beverage provided.  Q 15 safety checks in place.

## 2014-06-07 NOTE — [facility] Suicide Risk Assessment (Cosign Needed)
Suicide Risk Assessment  Discharge Assessment   [facility]   Demographic Factors:  Caucasian, Low socioeconomic status and Unemployed  Total Time spent with patient: 30 minutes  Musculoskeletal: Strength & Muscle Tone: within normal limits Gait & Station: normal Patient leans: N/A  Psychiatric Specialty Exam:     Blood pressure 135/78, pulse 86, temperature 97.7 F (36.5 C), temperature source Oral, resp. rate 20, SpO2 98 %.There is no height or weight on file to calculate BMI.  General Appearance: Casual  Eye Contact::  Good  Speech:  Clear and Coherent and Normal Rate409  Volume:  Normal  Mood:  Depressed  Affect:  Congruent and Depressed  Thought Process:  Coherent  Orientation:  Full (Time, Place, and Person)  Thought Content:  WDL  Suicidal Thoughts:  No  Homicidal Thoughts:  No  Memory:  Immediate;   Good Recent;   Good Remote;   Good  Judgement:  Fair  Insight:  Fair  Psychomotor Activity:  Normal  Concentration:  Good  Recall:  NA  Fund of Knowledge:Good  Language: Good  Akathisia:  NA  Handed:  Right  AIMS (if indicated):     Assets:  Desire for Improvement  Sleep:     Cognition: WNL  ADL's:  Intact      Has this patient used any form of tobacco in the last 30 days? (Cigarettes, Smokeless Tobacco, Cigars, and/or Pipes) Yes, Prescription not provided because: does not want to quit at this time  Mental Status Per Nursing Assessment::   On Admission:     Current Mental Status by Physician: NA  Loss Factors: NA  Historical Factors: Prior suicide attempts and 2010 OD, Cut wrist  Risk Reduction Factors:   NA  Continued Clinical Symptoms:  Schizophrenia:   Paranoid or undifferentiated type  Cognitive Features That Contribute To Risk:  Closed-mindedness, Polarized thinking and Thought constriction (tunnel vision)    Suicide Risk:  Minimal: No identifiable suicidal ideation.  Patients presenting with no risk factors  but with morbid ruminations; may be classified as minimal risk based on the severity of the depressive symptoms  Principal Problem: Schizophrenia, paranoid type Discharge Diagnoses:  Patient Active Problem List   Diagnosis Date Noted  . Noncompliance with medication regimen [Z91.14]   . Schizophrenia, paranoid type [F20.0] 06/06/2014  . Auditory hallucinations [R44.0]   . Visual hallucinations [R44.1]       Plan Of Care/Follow-up recommendations:  Activity:  as tolerated Diet:  regular  Is patient on multiple antipsychotic therapies at discharge:  No   Has Patient had three or more failed trials of antipsychotic monotherapy by history:  Yes,   Antipsychotic medications that previously failed include:   1.  Haldol, Seroquel.  Recommended Plan for Multiple Antipsychotic Therapies: NA    Dahlia ByesONUOHA, Sherlynn Tourville, C   PMHNP-BC 06/07/2014, 11:58 AM

## 2014-06-09 ENCOUNTER — Inpatient Hospital Stay: Payer: Self-pay | Admitting: Psychiatry

## 2014-06-09 LAB — COMPREHENSIVE METABOLIC PANEL
ANION GAP: 5 — AB (ref 7–16)
AST: 102 U/L — AB (ref 15–37)
Albumin: 3.8 g/dL (ref 3.4–5.0)
Alkaline Phosphatase: 110 U/L (ref 46–116)
BUN: 13 mg/dL (ref 7–18)
Bilirubin,Total: 0.8 mg/dL (ref 0.2–1.0)
CALCIUM: 9 mg/dL (ref 8.5–10.1)
Chloride: 105 mmol/L (ref 98–107)
Co2: 30 mmol/L (ref 21–32)
Creatinine: 1.08 mg/dL (ref 0.60–1.30)
EGFR (African American): >60
Glucose: 108 mg/dL — ABNORMAL HIGH (ref 65–99)
OSMOLALITY: 280 (ref 275–301)
POTASSIUM: 3.5 mmol/L (ref 3.5–5.1)
SGPT (ALT): 45 U/L (ref 14–63)
SODIUM: 140 mmol/L (ref 136–145)
Total Protein: 7.6 g/dL (ref 6.4–8.2)

## 2014-06-09 LAB — URINALYSIS, COMPLETE
Bacteria: NONE SEEN
Bilirubin,UR: NEGATIVE
Glucose,UR: NEGATIVE mg/dL (ref 0–75)
LEUKOCYTE ESTERASE: NEGATIVE
NITRITE: NEGATIVE
Ph: 5 (ref 4.5–8.0)
Protein: NEGATIVE
RBC,UR: 6 /HPF (ref 0–5)
Specific Gravity: 1.019 (ref 1.003–1.030)
Squamous Epithelial: NONE SEEN

## 2014-06-09 LAB — CBC
HCT: 48.3 % (ref 40.0–52.0)
HGB: 16.2 g/dL (ref 13.0–18.0)
MCH: 30.8 pg (ref 26.0–34.0)
MCHC: 33.5 g/dL (ref 32.0–36.0)
MCV: 92 fL (ref 80–100)
PLATELETS: 306 10*3/uL (ref 150–440)
RBC: 5.26 10*6/uL (ref 4.40–5.90)
RDW: 13.6 % (ref 11.5–14.5)
WBC: 10.1 10*3/uL (ref 3.8–10.6)

## 2014-06-09 LAB — DRUG SCREEN, URINE

## 2014-06-09 LAB — ETHANOL: Ethanol: <3 mg/dL

## 2014-06-09 LAB — SALICYLATE LEVEL

## 2014-06-09 LAB — ACETAMINOPHEN LEVEL: Acetaminophen: <2 ug/mL

## 2014-09-02 NOTE — Consult Note (Signed)
PATIENT NAME:  Kenneth [name], Kenneth [name] MR#:  [id number] DATE OF BIRTH:  02-08-1966  AGE:  49 years  SEX:  Male  RACE:  White  DATE OF DICTATION: 09/17/2013  PLACE OF DICTATION[facility], [place]  CONSULTING PHYSICIAN:  Tyna Huertas K. Viraat Vanpatten, MD  SUBJECTIVE: The patient was seen in consultation in room #22B, [facility]. The patient is a 49 year old white male, not employed, and been recently released from prison after being there 15 years. The patient was asked the reason for the same, he reported "I don't want to talk about it." The patient reports he is single, not married, not employed, and the only support and family he has is his mother, who lives at home. According to information obtained from the staff, the patient had [facility] room 3 times in the past few days complaining about hallucinations. He does not seem to be psychotic or hallucinating. He reported this time that he was engaged in a conflict with another individual prior to his arrival to the ED. He reports that he gets commands telling him to harm himself, but he denies that he will act on the commands. Alcohol and drugs are denied.   Legal status: The patient has been in prison for 15 years, and recently released. He has a Engineer, drillingprobation officer. He was released with follow up appointment with ACT team with [facility]. The patient reported that he had been to ACT team with [facility], and he was told that they were not going to help him, and he reported "yes they did, on my face."   MENTAL STATUS EXAMINATION: The patient is alert and oriented, aware of his behavior, fully aware of the reason why he came here for admission. Poor hygiene. Poor eye contact. Very demanding, irritable, and upset. He constantly stated that he feels that   he is not being helped by anybody and is upset about the same. Denies any suicidal or homicidal plans, and contracts for safety. He is homeless, and he is upset, and he reports  that he does not know what he is going to do because of being homeless. Insight and judgment guarded.  IMPRESSION: History of schizophrenia. 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 Very poor social support system.   PLAN AND RECOMMENDATIONS:  The patient is to be evaluated by Mr. Theodosia PalingKent Smith on Monday, that is 09/19/2013, for appropriate disposition. This is the third time he has come in the past few days to the emergency room [facility], so Mr. Theodosia PalingKent Smith is to look into appropriate disposition planning.   ____________________________ Jannet MantisSurya K. Guss [place]unde[place]alla, MD skc:mr D: 09/17/2013 15:32:43 ET T: 09/17/2013 19:42:13 ET JOB#: [id number]  cc: Monika SalkSurya K. Guss Bunde[place], MD, <Dictator> Beau FannySURYA K Mariaclara Spear MD ELECTRONICALLY SIGNED 09/19/2013 1:30

## 2014-09-02 NOTE — Consult Note (Signed)
Brief Consult Note: Diagnosis: Schizophrenia, PT.   Patient was seen by consultant.   Recommend further assessment or treatment.   Comments: Pt seen in ED B[facility]. He was admitted due to non complaince with meds and having AH. He stated that he follows [facility] ACTT team. Pt reported this morning that he is doing better and he is not experiencing any AH. He is willing to follow up with [facility] ACTT team. He denied SI/HI or plans.   Plan: Pt will be discharged in stable condition.  He will follow up with [facility] and no meds dispensed at this time.  Pt agreed with plan.  Electronic Signatures: Rhunette CroftFaheem, Macarius Ruark S (MD)  (Signed [phone]-May-15 10:27)  Authored: Brief Consult Note   Last Updated: 05-May-15 10:27 by Rhunette CroftFaheem, Rhenda Oregon S (MD)

## 2014-09-02 NOTE — Consult Note (Signed)
Brief Consult Note: Diagnosis: schiophrenia.   Patient was seen by consultant.   Recommend further assessment or treatment.   Orders entered.   Comments: PSychiatry: Patient seen. Discussed with Dr Mindi JunkerGottlieb .Marland Kitchen. Still in process of triage re admission, but will restart his psych meds and BP meds and comfort medds for now and re-evaluate for possible admission vs other disposition. Patient understands plan.  Electronic Signatures: Audery Amellapacs, Elisabel Hanover T (MD)  (Signed 03-May-15 15:34)  Authored: Brief Consult Note   Last Updated: 03-May-15 15:34 by Audery Amellapacs, Adine Heimann T (MD)

## 2014-09-02 NOTE — Consult Note (Signed)
PATIENT NAME:  Kenneth [name], Kenneth [name] MR#:  [id number] DATE OF BIRTH:  09/08/1965  DATE OF CONSULTATION:  09/12/2013  REFERRING PHYSICIAN:   CONSULTING PHYSICIAN:  AuAthena Massedery AmelJohn T. Xavian Hardcastle, MD  IDENTIFYING INFORMATION AND CHIEF COMPLAINT: A 49 year old man with a reported history of schizophrenia who came to the Emergency Room stating that he was having auditory hallucinations and wanted to be back on his medicine.   HISTORY OF PRESENT ILLNESS: Information obtained from the patient and the chart and now from old records we have gotten from the local mental health agency. The patient came to the Emergency Room stating that he has a diagnosis of schizophrenia and has been off his medicine and wanted to get back on it. He denied that he was having suicidal or homicidal ideation. He told a rambling story about how police had picked him up for some kind of false charge which had kept him away from the homeless shelter for longer than he intended because he had to walk back there all the way from [place]. As a result, he missed curfew and was thrown out of the shelter. He denies active substance abuse.   PAST PSYCHIATRIC HISTORY: The patient evidently has a long history of mental health problems, but also a long history of criminal behavior. He just got out of doing 15 years in prison fairly recently. (Dictation Anomaly) <<afterMISSING TEXT>>  that hospitalization he was transferred to [facility] briefly but has been out of institutions entirely for perhaps a couple of months. He was [facility] because he allegedly has a relative in the area but the patient actually has no supportive contacts here. He has a history of medication intermittent compliance. Auditory hallucinations, but denies suicidal or homicidal behavior related to mental health.   SOCIAL HISTORY: Not married, just out of prison, unemployable, gets no money, but evidently has Medicaid somehow. He is  currently homeless with minimal resources. He had been seen at a local mental health agency and evaluated and started back on Trilafon 16 mg twice a day and had even been assigned an ACT team. He had seen Dr. Cherylann RatelLateef.   FAMILY HISTORY: He says is positive for schizophrenia.   SUBSTANCE ABUSE HISTORY:  He says that he has a history of doing a lot of drugs and alcohol over the years in the past.   PAST MEDICAL HISTORY: He has high blood pressure. Says that he has had heart disease in the past.   CURRENT MEDICATIONS: Lisinopril 5 mg twice a day, Trilafon 16 mg twice a day, Cogentin p.r.n.   ALLERGIES: FLUOXETINE, HALDOL, PENICILLIN.   REVIEW OF SYSTEMS: Auditory hallucinations. Positive. Irritable mood. Poor sleep. No suicidal or homicidal ideation. No other acute physical symptoms. The rest of the review of systems is negative.   MENTAL STATUS EXAMINATION: Disheveled man, looks his stated age, cooperative with the interview to some degree, although he gets disorganized and has a hard time staying on topic. Eye contact good. Psychomotor activity a little fidgety. Speech is rambling and slurred at times, a little difficult to understand. Affect is constricted. Mood stated as being nervous. Thoughts disorganized. His stories ramble and are very hard to follow. Did not make any obviously bizarre delusional statements. Endorses still having auditory hallucinations, but denies suicidal or homicidal ideation. Judgment and insight poor. Intelligence appears to probably be impaired. Short term and longer-term memory both impaired to testing. Alert and oriented x 4, however.   LABORATORY RESULTS:  The drug screen was negative.  The alcohol level was negative. The urinalysis showed ketones in it. White count normal. CBC normal. Nothing really remarkable on the chemistry panel.   ASSESSMENT: This is a 49 year old man with a history of schizophrenia and antisocial behavior who has been thrown out of the homeless  shelter for missing curfew. As a result, he has no place to stay. No supportive family in the area. No finances. On the other hand, he is denying suicidal ideation and appears to be alert, oriented, and able to make basic daily decisions. He is cooperative with medication and has been cooperative with ACT team services in the community.   TREATMENT PLAN: He was restarted on medicine here in the Emergency Room and he was evaluated by the discharge coordinator for options. At this point, the patient is requesting to be discharged tomorrow and wants to go to the offices of the ACT team to see if they can help him. There does not seem to be a clear reason to forcibly commit him or make him stay in the hospital. We will discuss options with him, but likely discharge him tomorrow.   DIAGNOSIS, PRINCIPAL AND PRIMARY:   AXIS I: Schizophrenia, paranoid type.   SECONDARY DIAGNOSES:  AXIS I: Deferred.  AXIS II: Antisocial traits.  AXIS III: High blood pressure.  AXIS IV: Severe from homelessness and imprisonment and recent institutionalization.  AXIS V: Functioning at time of evaluation is 50.    ____________________________ Audery Amel, MD jtc:dd D: 09/12/2013 22:36:40 ET T: 09/13/2013 02:52:03 ET JOB#: [id number]  cc: Audery Amel, MD, <Dictator> Audery Amel MD ELECTRONICALLY SIGNED 09/14/2013 20:20

## 2014-09-10 NOTE — H&P (Signed)
PATIENT NAME:  Kenneth [name], Kenneth M MR#:  [id number] DATE OF BIRTH:  [birth year]  DATE OF ADMISSION:  06/09/2014   REFERRING PHYSICIAN: Emergency Room MD   ATTENDING PHYSICIAN: Kristine LineaJolanta Blase Beckner, MD   IDENTIFYING DATA: Kenneth [name] is a 49 year old    This is in error, the patient already has an H and P dictated on 06/10/2014.   DICTATION ENDS HERE    ____________________________ Ellin GoodieJolanta B. Jennet MaduroPucilowska, MD jbp:DT D: 06/12/2014 13:48:14 ET T: 06/12/2014 14:17:05 ET JOB#: [id number]  cc: Shevawn Langenberg B. Jennet MaduroPucilowska, MD, <Dictator>

## 2014-09-10 NOTE — Consult Note (Signed)
PATIENT NAME:  Kenneth [name], Kenneth M MR#:  [id number] DATE OF BIRTH:  Aug 16, 1965  DATE OF CONSULTATION:  06/09/2014  REFERRING PHYSICIAN:   CONSULTING PHYSICIAN:  Audery AmelJohn T. Jaylani Mcguinn, MD  IDENTIFYING INFORMATION AND REASON FOR CONSULTATION: A 49 year old man with a history of schizophrenia, who came to the hospital.   CHIEF COMPLAINT: "I need help."  HISTORY OF PRESENT ILLNESS: Information obtained from the patient and the chart. The patient came to the hospital saying that he needed some help because he walked here from [place]. He tells me that he feels confused, and he needs some help because he has no place to stay and cannot take care of himself. He has been prescribed antipsychotic medication, but is not currently taking it. He is not able to give a very coherent history of recent events. He does tell me that he walked here from [place], which is the story he has repeated earlier as well. He denies that he has been abusing alcohol or drugs. He says he has chronic auditory hallucinations. His condition clearly worsened by being off of medication, and being unstable in his behavior.   PAST PSYCHIATRIC HISTORY: History of diagnosis of schizophrenia, has had psychiatric hospitalizations in the past, but also had extended prison time, which ended last year. Since then, it seems like he has not really been able to establish much stability. He is not able to give me a clear answer about whether he has ever tried to kill himself in the past, and denies any violence.   PAST MEDICAL HISTORY: Pain in his feet. Otherwise, denies any known ongoing medical problems.   FAMILY HISTORY: He denies knowing of any family history.   SOCIAL HISTORY: Appears to be homeless. As far as we can tell, has no family that he can turn to. Says that his mother lives here locally, but seems to be helpless to try and get any assistance from her. Does not get any financial assistance. Has been thrown out of the homeless shelter  in the past.   CURRENT MEDICATIONS: Apparently not taking any current medications.    ALLERGIES: PROZAC, HALDOL, LOXITANE, PENICILLIN.   REVIEW OF SYSTEMS: Auditory hallucinations. No suicidal ideation, but talks about how he wants to kill devil worshipers. He has pain in his feet from walking so far, and has blisters on them. No other specific physical complaints.   MENTAL STATUS EXAMINATION: Disheveled, unclean man who looks his stated age. Just barely cooperative as much as he can be. Eye contact poor. Psychomotor activity very sluggish. Speech very decreased in total amount. He answers most questions with slurred, odd speech. Even when he speaks clearly, he does not make much sense most of the time. Affect flat. Mood stated as being anxious. Thoughts are disorganized. Often do not make sense at all. Endorses auditory hallucinations. Denies visual hallucinations. Denies suicidal ideation, but (Dictation Anomaly) <<admits toMISSING TEXT>>  homicidal ideation. He can repeat 3 words immediately, remembers 1 of them at 3 minutes. Judgment and insight poor. Intelligence unable to be assessed right now.   VITAL SIGNS: Blood pressure currently 130/73, pulse 110, respirations 20, temperature 98.9.   LABORATORY RESULTS: CBC is all normal. Chemistry Panel: Elevated glucose 108; otherwise, really nothing very remarkable. Alcohol level is negative. Drug screen is all negative. Urinalysis: 1+ blood; borderline with cells, probably not infected.   ASSESSMENT: A 49 year old man with schizophrenia, off medication. Homeless. Making homicidal statements. Confused, disorganized behavior. Does not appear to be capable of taking care of himself  in any sane manner. Needs hospitalization for stabilization.   TREATMENT PLAN: Admit the patient to psychiatry. Based on previous medications that have been used before. I am going to restart him on lisinopril and Trilafon. Fall, suicide, elopement and close precautions.    DIAGNOSIS, PRINCIPAL AND PRIMARY:  AXIS I: Schizophrenia.   SECONDARY DIAGNOSES:  AXIS I: No further.  AXIS II: Deferred.  AXIS III: Chronic foot pain, history of high blood pressure.    ___________________________ Audery Amel, MD jtc:MT D: 06/09/2014 15:39:39 ET T: 06/09/2014 16:00:14 ET JOB#: [id number]  cc: Audery Amel, MD, <Dictator> Audery Amel MD ELECTRONICALLY SIGNED 06/21/2014 17:27

## 2014-09-10 NOTE — H&P (Signed)
PATIENT NAME:  Kenneth [name], [name] MR#:  [id number] DATE OF BIRTH:  1965/08/13  DATE OF ADMISSION:  06/09/2014  IDENTIFYING INFORMATION: A 49 year old single Caucasian male, currently homeless, who was released from prison a couple of weeks ago after a 16 year sentence for kidnapping a Theme park managergovernment official.   CHIEF COMPLAINT: "Somebody was trying to kill me."   HISTORY OF PRESENT ILLNESS: The patient presented to our Emergency Department reporting that somebody was trying to kill him at the shelter in [place], where he has been staying for a couple of days. The patient identifies this person as the Illuminati. He states that the Illuminati was trying to poison him, and was trying to kill him in his sleep. The patient says that the Illuminati had tricked him into believing that the methadone clinic was the hospital, and he was taken to the methadone clinic and injected with medications. The patient talks at length about some people that were devil worshipers. He also states that there were people behind him following [place] the way from [facility] to [place] and that destroyed them and killed them using his powers. He did not see them dying, but he states that he saw it on the news that he had died. The patient states that from prison, he was released on Trilafon, as he suffers from schizophrenia. He states that he had been taking this medication up until the day he left the shelter in [place], as he was afraid for his life. He is not able to tell me specific date of when he last took the Trilafon, neither when he was released from prison. The patient has not followed up with a psychiatrist. He has a limited support. Today, he is denying hallucinations, suicidality, homicidality. He denies side effects from his medications. He states he has a rash on his arm and other than that, he denies any complaints. He showed me some blisters on his feet that were related from walking all the way from [place] to  [place]. He does smoke cigarettes, about a pack a day. He denies the use of alcohol or any illicit substances.   PAST PSYCHIATRIC HISTORY: The patient said that he was diagnosed with schizophrenia prior to going to prison and he was receiving disability. He was discharged from his psychiatrist in prison on Trilafon; however, he is unable to tell me the dose or when he last took it. The patient has not followed up with a psychiatrist. He does report a suicidal attempt that appears to have  happened while in prison, so this is unclear. He says he overdosed on medications. He denies self-injurious behaviors.  PAST MEDICAL HISTORY: Positive for hypertension, for which he is prescribed with amlodipine per the chart. No other chronic medical problems. He denies history of seizures or head trauma.   FAMILY HISTORY: Noncontributory. Denies any history of mental illness or suicides in his family.   SOCIAL HISTORY: The patient, up until recently, was staying at the shelter in [place]. He is homeless. His only family member is his stepmother, who lives in [place], [place], but he does not have a phone number or an address. He is single, never married. He said that he has many children, all grown, but has not contact with any of them. He is currently uninsured, and wants help from the social worker in order to obtain his disability back. He explains his legal charges were related to kidnapping a mental  health tech from what appears to have been a psychiatric  hospital.   ALLERGIES: THE PATIENT CLAIMS THAT HE IS ALLERGIC TO LOXAPINE, HALDOL, AND PENICILLIN.   REVIEW OF SYSTEMS: The patient does complain of blisters on his feet and having a rash on his arm. The rest of the 10 system review is negative.   MENTAL STATUS EXAMINATION: The patient is a 49 year old Caucasian male, who appears his stated age. He displays good grooming and hygiene. He was calm, pleasant, and cooperative. Psychomotor activity  mildly decreased. Eye contact within normal range. His speech was pushed, but had a regular volume and rate. His thought process is disorganized. Thought content was negative for suicidality, negative for homicidality, negative for auditory or visual hallucinations, but positive for persecutory delusions and delusions of grandiosity, as he believes having some special powers to destroy his enemies. His affect is reactive. His mood is euthymic. His affect is restricted. Insight and judgment poor.   COGNITIVE EXAMINATION: he is alert and oriented in person, place, time, and situation. Attention and concentration appear to be grossly intact. Fund of knowledge appears to be average for his level of education.   LABORATORY RESULTS: WBC shows a hemoglobin of 16.2, hematocrit of 48.3, platelet count 306, white blood cells 10.1. Urine toxicology is negative. Comprehensive metabolic panel shows sodium of 140, potassium 3.5, BUN 13, creatinine 1.08. Alcohol was below detection limit. AST was 102, ALT is 45. Acetaminophen level less than 2. Salicylate level less than 1.7.   DIAGNOSES: Schizophrenia, tobacco use disorder, severe; hypertension, contact dermatitis.   PSYCHOSOCIAL STRESSORS: Uninsured, limited support, homeless.   ASSESSMENT: The patient is a 49 year old patient with schizophrenia, who was recently released from prison after he had kidnapped a government official due to delusional thinking and paranoia. The patient has not been compliant with his medications after release. He has not followed up with a psychiatrist. He has limited support in this the community and has lost his disability and Medicaid. At this point in time, the patient is psychotic and in need of stabilization.   PLAN: For schizophrenia, he has been started on Trilafon 16 mg p.o. b.i.d. in the Emergency Department. For tobacco use disorder, he has been offered a Nicotrol inhaler; however, he declined from using it. For hypertension, he  has been started on lisinopril 5 mg p.o. daily. For extrapyramidal side effects prevention, he has been started on benztropine 0.5 mg b.i.d. For contact dermatitis, which appears to be secondary to the use of tape in his arm, likely applied there after blood samples were collected, I will order hydrocortisone 1% three times a day to affected area.  His diet will be change from regular to a low-sodium diet due to the hypertension. He will be continued on suicide and elopement precautions. Once stable, this patient will be discharged to the community, (possibly the shelter, as he is homeless) and he will be set up to follow up with an outpatient psychiatrist.    ____________________________ Jimmy Footman, MD ahg:ap D: 06/10/2014 10:13:26 ET T: 06/10/2014 12:26:40 ET JOB#: [id number]  cc: Jimmy Footman, MD, <Dictator> Horton Chin MD ELECTRONICALLY SIGNED 06/10/2014 16:59

## 2015-01-21 IMAGING — CR DG CHEST 2V
1 series · 3 of 3 positions shown · non-contrast
Comparison: 08/10/2013

CLINICAL DATA: Cough, fever, chest pain

EXAM:
CHEST  2 VIEW

[Series 1: pa · 0.17mm/px · 3 of 3 slices shown]
[im 1/3]
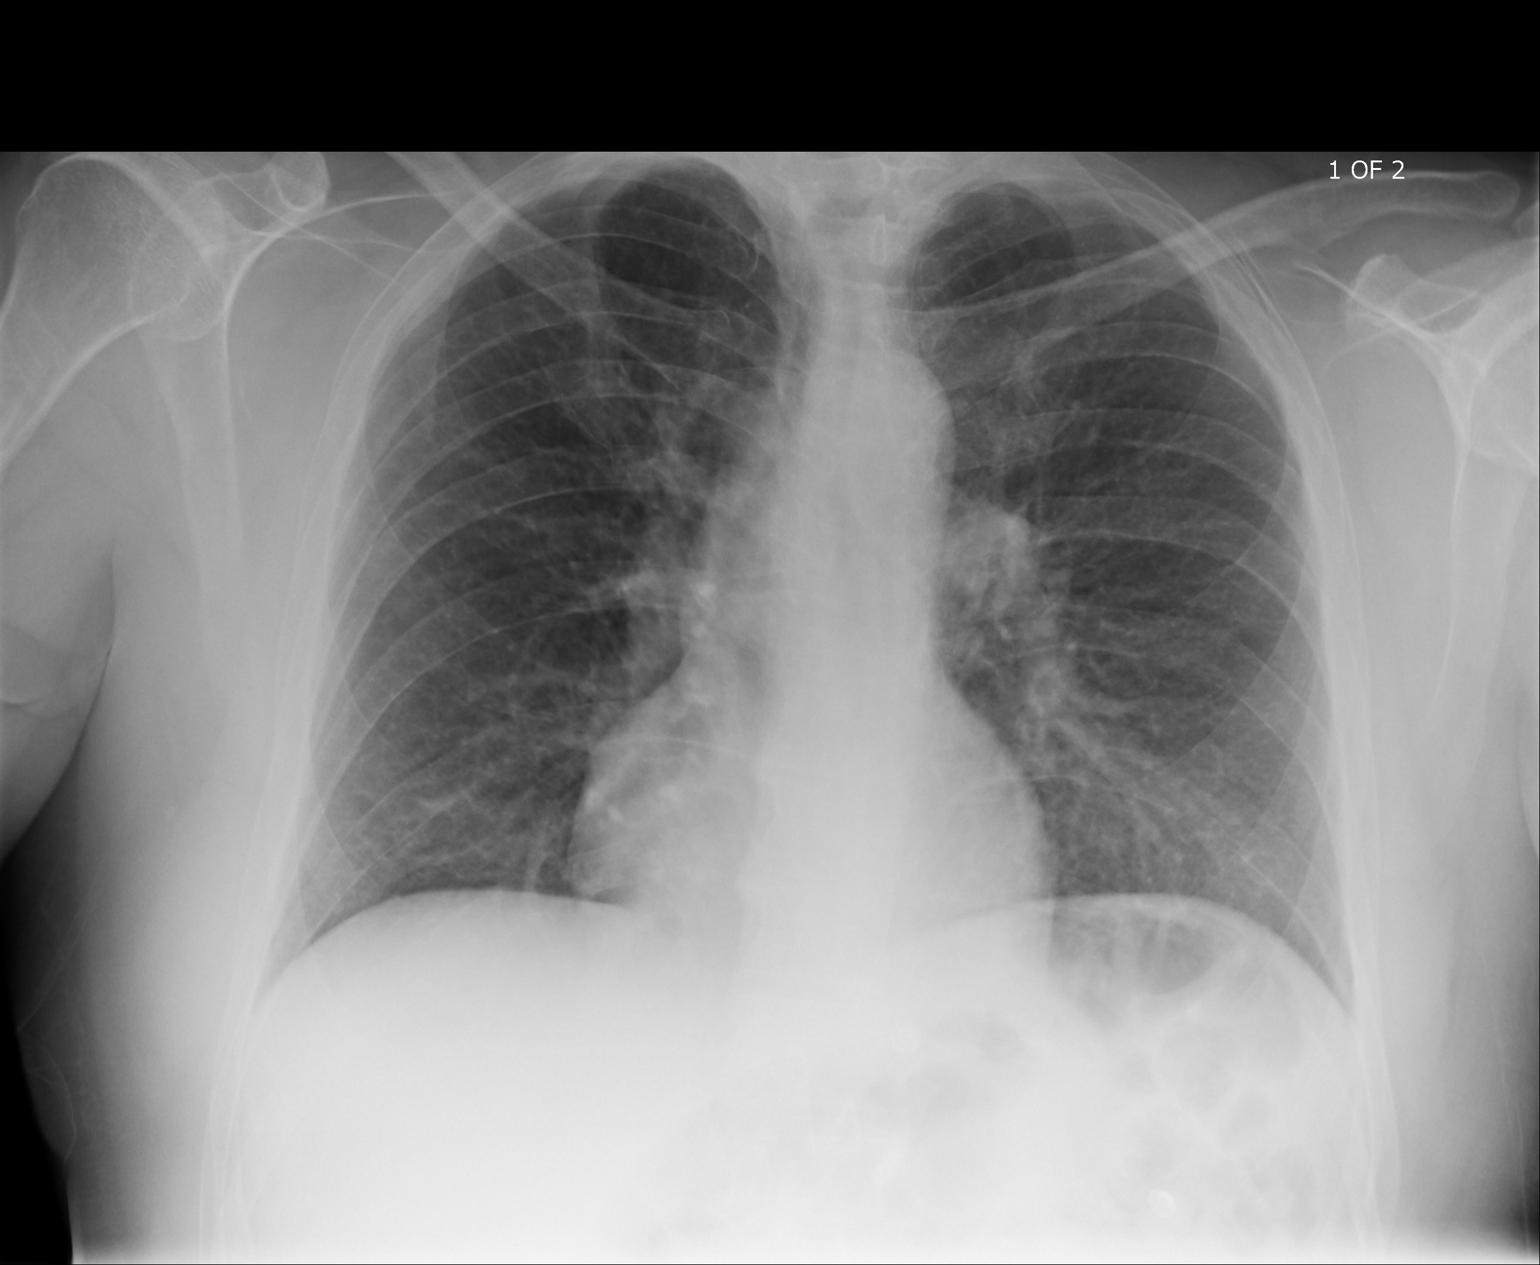
[im 2/3]
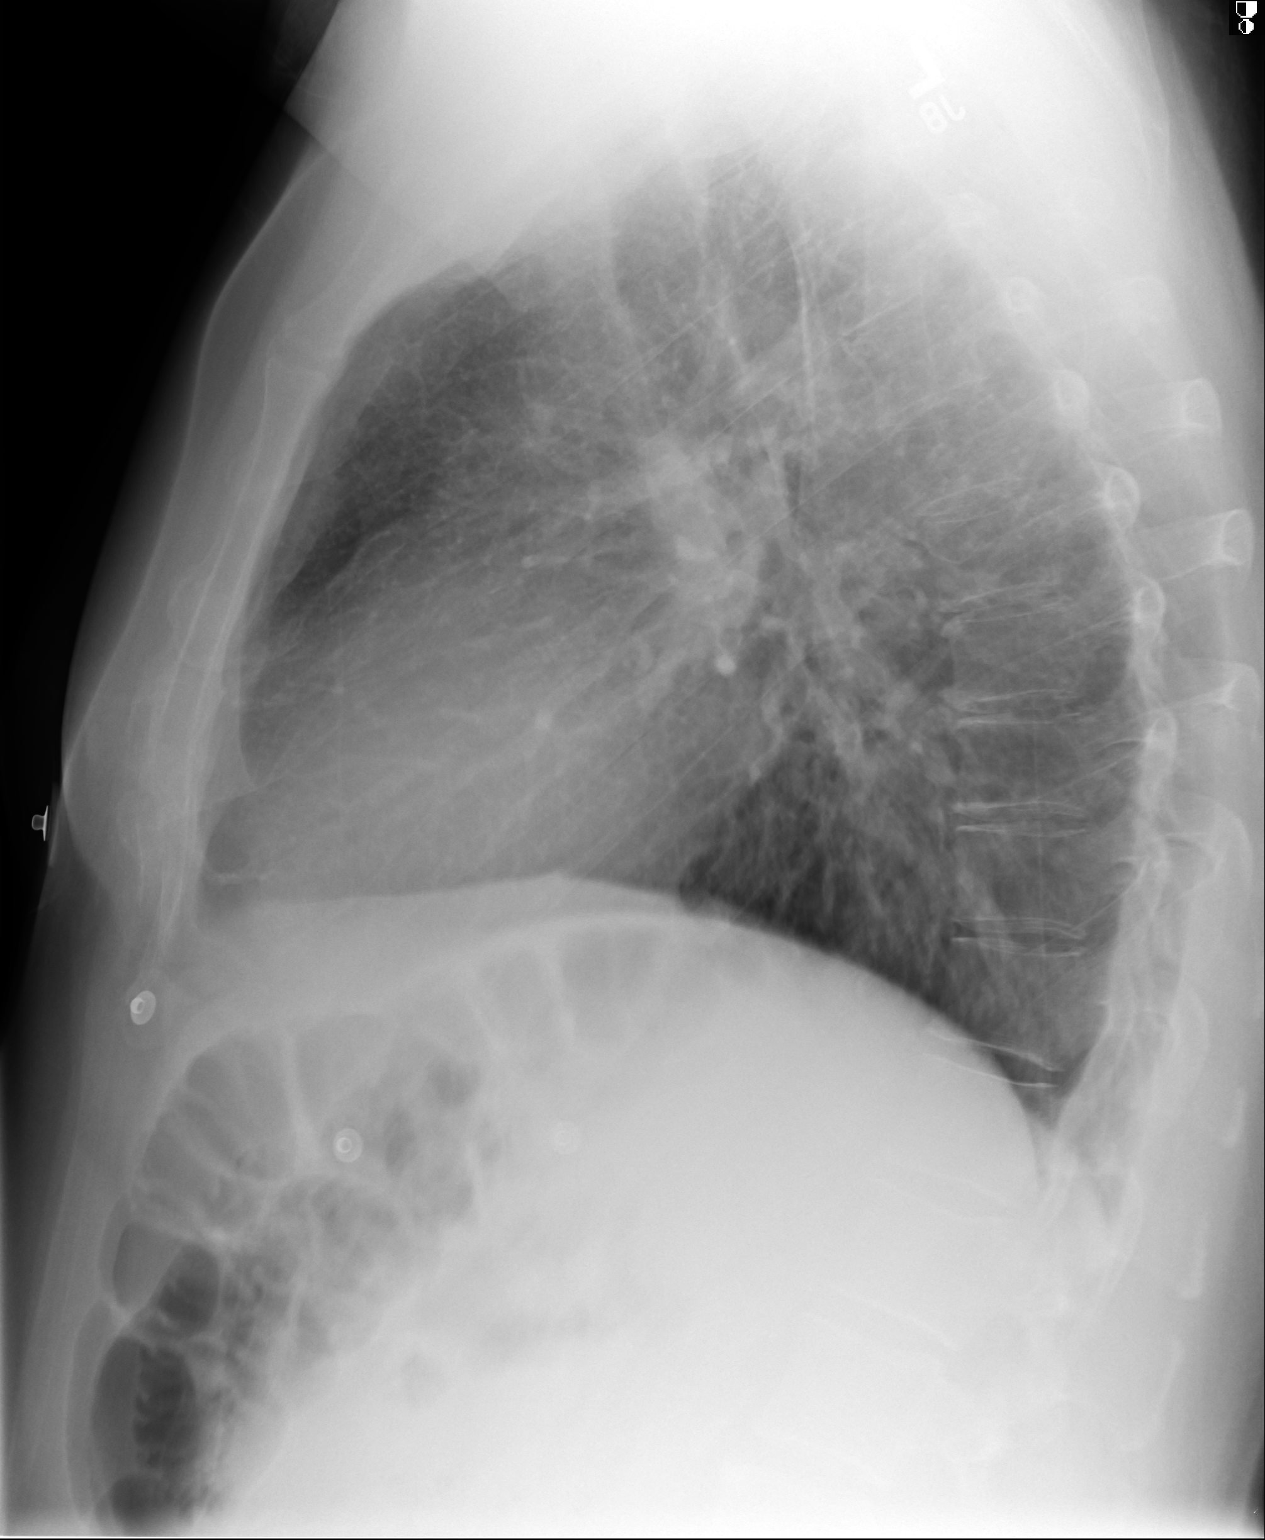
[im 3/3]
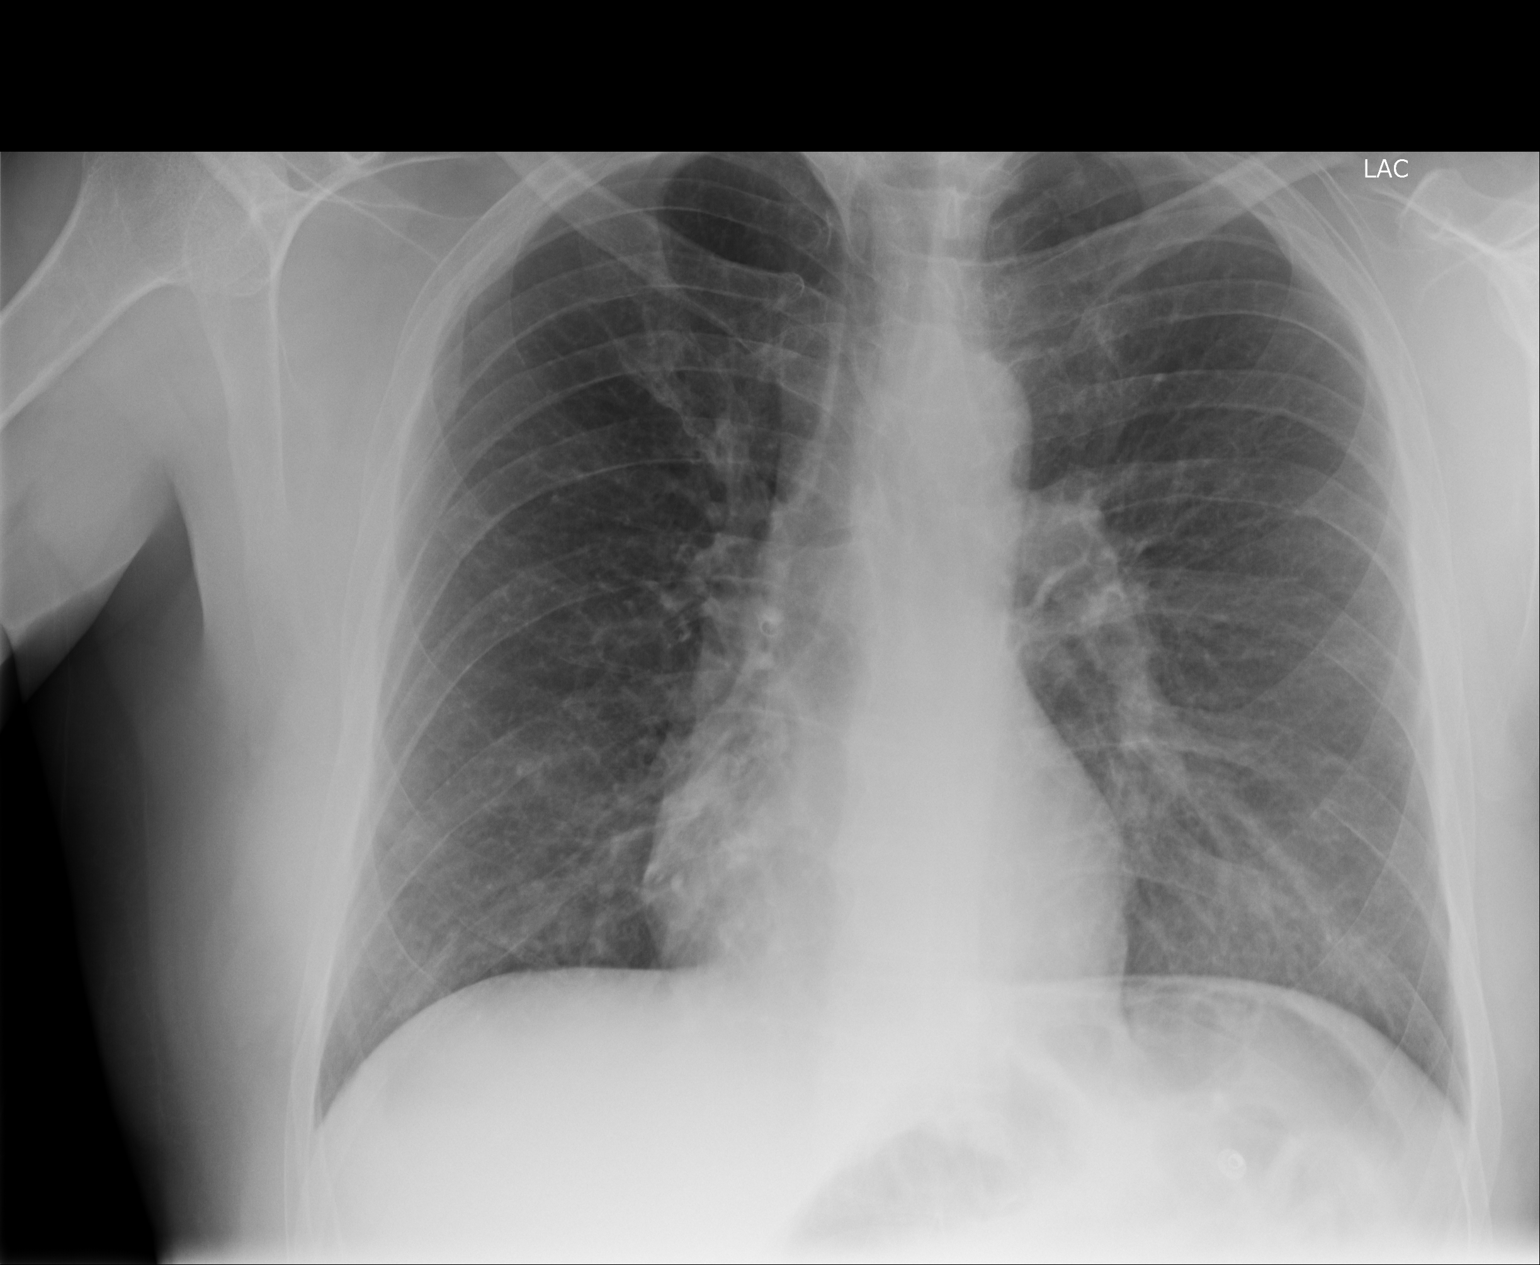

[3 of 3 positions shown; findings below may reference images not displayed]

FINDINGS: Cardiomediastinal silhouette is stable. No pulmonary edema. Central
mild bronchitic changes. Bony thorax is stable.
IMPRESSION: No acute infiltrate or pulmonary edema. Central mild bronchitic
changes.

## 2015-11-10 ENCOUNTER — Encounter (HOSPITAL_COMMUNITY): Payer: Self-pay | Admitting: Emergency Medicine

## 2015-11-10 ENCOUNTER — Emergency Department (HOSPITAL_COMMUNITY)
Admission: EM | Admit: 2015-11-10 | Discharge: 2015-11-14 | Disposition: A | Discharge status: Home / Self Care | Payer: Self-pay | Attending: Emergency Medicine | Admitting: Emergency Medicine

## 2015-11-10 DIAGNOSIS — R443 Hallucinations, unspecified: Secondary | ICD-10-CM | POA: Insufficient documentation

## 2015-11-10 DIAGNOSIS — R45850 Homicidal ideations: Secondary | ICD-10-CM

## 2015-11-10 DIAGNOSIS — Z79899 Other long term (current) drug therapy: Secondary | ICD-10-CM | POA: Insufficient documentation

## 2015-11-10 DIAGNOSIS — F200 Paranoid schizophrenia: Secondary | ICD-10-CM

## 2015-11-10 DIAGNOSIS — R45851 Suicidal ideations: Secondary | ICD-10-CM

## 2015-11-10 LAB — COMPREHENSIVE METABOLIC PANEL
ALT: 45 U/L (ref 17–63)
AST: 40 U/L (ref 15–41)
Albumin: 4.4 g/dL (ref 3.5–5.0)
Alkaline Phosphatase: 92 U/L (ref 38–126)
Anion gap: 12 (ref 5–15)
BILIRUBIN TOTAL: 0.6 mg/dL (ref 0.3–1.2)
BUN: 7 mg/dL (ref 6–20)
CALCIUM: 8.8 mg/dL — AB (ref 8.9–10.3)
CO2: 22 mmol/L (ref 22–32)
CREATININE: 0.97 mg/dL (ref 0.61–1.24)
Chloride: 100 mmol/L — ABNORMAL LOW (ref 101–111)
GFR calc Af Amer: >60 mL/min (ref >60)
Glucose, Bld: 120 mg/dL — ABNORMAL HIGH (ref 65–99)
Potassium: 3.7 mmol/L (ref 3.5–5.1)
Sodium: 134 mmol/L — ABNORMAL LOW (ref 135–145)
TOTAL PROTEIN: 7.4 g/dL (ref 6.5–8.1)

## 2015-11-10 LAB — RAPID URINE DRUG SCREEN, HOSP PERFORMED
AMPHETAMINES: NOT DETECTED
Barbiturates: NOT DETECTED
Benzodiazepines: NOT DETECTED
Cocaine: NOT DETECTED
Opiates: NOT DETECTED
Tetrahydrocannabinol: NOT DETECTED

## 2015-11-10 LAB — SALICYLATE LEVEL: Salicylate Lvl: <4 mg/dL (ref 2.8–30.0)

## 2015-11-10 LAB — DIFFERENTIAL
Basophils Absolute: 0 10*3/uL (ref 0.0–0.1)
Basophils Relative: 0 %
EOS ABS: 0.1 10*3/uL (ref 0.0–0.7)
EOS PCT: 1 %
LYMPHS ABS: 2.6 10*3/uL (ref 0.7–4.0)
Lymphocytes Relative: 19 %
MONO ABS: 1.4 10*3/uL — AB (ref 0.1–1.0)
Monocytes Relative: 10 %
NEUTROS PCT: 70 %
Neutro Abs: 9.3 10*3/uL — ABNORMAL HIGH (ref 1.7–7.7)

## 2015-11-10 LAB — CBC
HCT: 41.4 % (ref 39.0–52.0)
Hemoglobin: 14.9 g/dL (ref 13.0–17.0)
MCH: 31.2 pg (ref 26.0–34.0)
MCHC: 36 g/dL (ref 30.0–36.0)
MCV: 86.8 fL (ref 78.0–100.0)
PLATELETS: 325 10*3/uL (ref 150–400)
RBC: 4.77 MIL/uL (ref 4.22–5.81)
RDW: 13.8 % (ref 11.5–15.5)
WBC: 13.2 10*3/uL — ABNORMAL HIGH (ref 4.0–10.5)

## 2015-11-10 LAB — ETHANOL

## 2015-11-10 LAB — URINALYSIS, ROUTINE W REFLEX MICROSCOPIC
BILIRUBIN URINE: NEGATIVE
Glucose, UA: NEGATIVE mg/dL
Ketones, ur: NEGATIVE mg/dL
Leukocytes, UA: NEGATIVE
NITRITE: NEGATIVE
PROTEIN: NEGATIVE mg/dL
SPECIFIC GRAVITY, URINE: 1.006 (ref 1.005–1.030)
pH: 6.5 (ref 5.0–8.0)

## 2015-11-10 LAB — URINE MICROSCOPIC-ADD ON: Bacteria, UA: NONE SEEN

## 2015-11-10 LAB — ACETAMINOPHEN LEVEL: Acetaminophen (Tylenol), Serum: <10 ug/mL — ABNORMAL LOW (ref 10–30)

## 2015-11-10 MED ORDER — OLANZAPINE 10 MG PO TBDP: 10.0000 mg | ORAL_TABLET | Freq: Three times a day (TID) | ORAL | Status: DC | PRN

## 2015-11-10 MED ORDER — BENZTROPINE MESYLATE 1 MG PO TABS
0.5000 mg | ORAL_TABLET | Freq: Two times a day (BID) | ORAL | Status: DC
  Administered 2015-11-10 – 2015-11-14 (×9): 0.5 mg via ORAL
  Filled 2015-11-10 (×9): qty 1

## 2015-11-10 MED ORDER — CLOZAPINE 100 MG PO TABS
100.0000 mg | ORAL_TABLET | Freq: Two times a day (BID) | ORAL | Status: DC
  Administered 2015-11-10 – 2015-11-14 (×9): 100 mg via ORAL
  Filled 2015-11-10 (×9): qty 1

## 2015-11-10 NOTE — ED Notes (Addendum)
0942--Dr A and Shavon NP into see.  Pt reports that he is "here to claim my powers."  Pt reports that he is homeless, and wants long term placement.  Pt also reports that he was [facility].

## 2015-11-10 NOTE — ED Provider Notes (Signed)
CSN: [id number]     Arrival date & time 11/10/15  0108 History   First MD Initiated Contact with Patient 11/10/15 0128     Chief Complaint  Patient presents with  . Hallucinations  . Medical Clearance     HPI  Patient presents for evaluation stating that he feels like demons are telling him to kill people. He further states that he is disease. At times he states he is "the God". States he has super powers that would enable him to kill people. He denies any other specific plan of homicide.  Patient carries a diagnosis of schizophrenia per his report.  History reviewed. No pertinent past medical history. History reviewed. No pertinent past surgical history. No family history on file. Social History  Substance Use Topics  . Smoking status: Never Smoker   . Smokeless tobacco: None  . Alcohol Use: No    Review of Systems  Unable to perform ROS: Psychiatric disorder      Allergies  Haldol; Loxapine; and Penicillins  Home Medications   Prior to Admission medications   Medication Sig Start Date End Date Taking? Authorizing Provider  cloZAPine (CLOZARIL) 100 MG tablet Take 50-300 mg by mouth 2 (two) times daily. 50 mg in the morning and 300 at bedtime   Yes Historical Provider, MD   BP 140/80 mmHg  Pulse 98  Temp(Src) 98.3 F (36.8 C) (Oral)  Resp 18  Ht 6' (1.829 m)  Wt 250 lb (113.399 kg)  BMI 33.90 kg/m2  SpO2 96% Physical Exam  Constitutional: He is oriented to person, place, and time. He appears well-developed and well-nourished. No distress.  HENT:  Head: Normocephalic.  Eyes: Conjunctivae are normal. Pupils are equal, round, and reactive to light. No scleral icterus.  Neck: Normal range of motion. Neck supple. No thyromegaly present.  Cardiovascular: Normal rate and regular rhythm.  Exam reveals no gallop and no friction rub.   No murmur heard. Pulmonary/Chest: Effort normal and breath sounds normal. No respiratory distress. He has no wheezes. He has no rales.   Abdominal: Soft. Bowel sounds are normal. He exhibits no distension. There is no tenderness. There is no rebound.  Musculoskeletal: Normal range of motion.  Neurological: He is alert and oriented to person, place, and time.  Skin: Skin is warm and dry. No rash noted.  Psychiatric: His mood appears anxious. His speech is tangential. He is actively hallucinating. Thought content is paranoid. He expresses homicidal ideation.    ED Course  Procedures (including critical care time) Labs Review Labs Reviewed  COMPREHENSIVE METABOLIC PANEL - Abnormal; Notable for the following:    Sodium 134 (*)    Chloride 100 (*)    Glucose, Bld 120 (*)    Calcium 8.8 (*)    All other components within normal limits  ACETAMINOPHEN LEVEL - Abnormal; Notable for the following:    Acetaminophen (Tylenol), Serum <10 (*)    All other components within normal limits  CBC - Abnormal; Notable for the following:    WBC 13.2 (*)    All other components within normal limits  ETHANOL  SALICYLATE LEVEL  URINE RAPID DRUG SCREEN, HOSP PERFORMED    Imaging Review No results found. I have personally reviewed and evaluated these images and lab results as part of my medical decision-making.   EKG Interpretation None      MDM   Final diagnoses:  Hallucinations    Patient with a WBC 13.2. He is afebrile. Sodium 134 chloride 100 CO2 22.  Acetaminophen, salicylate, alcohol, toxicology negative. Await TTS evaluation and placement    Rolland PorterMark Querida Beretta, MD 11/10/15 [phone]

## 2015-11-10 NOTE — ED Notes (Signed)
Up to the bathroom 

## 2015-11-10 NOTE — ED Notes (Signed)
Patient noted sleeping in room. No complaints, stable, in no acute distress. Q15 minute rounds and monitoring via Security Cameras to continue.  
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
## 2015-11-10 NOTE — ED Notes (Signed)
Up to the desk 

## 2015-11-10 NOTE — ED Notes (Signed)
Pt. Transferred to [facility] from ED to room 37 [facility]. Pt. Oriented to unit including Q15 minute rounds as well as the security cameras for their protection. Patient is alert and oriented, warm and dry in no acute distress. Patient denies HI. Pt. Evasive about SI but states he hears voices without command.. Pt. Encouraged to let me know if needs arise.

## 2015-11-10 NOTE — Progress Notes (Signed)
Disposition CSW [facility]# [id number].  Seward SpeckLeo Dray Dente Corcoran District HospitalCSW,LCAS Behavioral Health Disposition CSW [phone]

## 2015-11-10 NOTE — ED Notes (Signed)
tts into see 

## 2015-11-10 NOTE — BH Assessment (Signed)
[facility] Assessment Progress Note   Per [place] patient meets criteria for an inpatient admission as appropriate bed placement is investigated

## 2015-11-10 NOTE — ED Notes (Signed)
Report from Janie Rambo RN. Patient sleeping, respirations regular and unlabored. Q15 minute rounds and security camera observation to continue.   

## 2015-11-10 NOTE — ED Notes (Signed)
Pt presents with complaints of hallucinations of demons telling him to kill people. Pt states he "is Zeus the golden child who kills people with his superpowers". Pt is calm and cooperative at time of assessment, but states he [facility]

## 2015-11-10 NOTE — Progress Notes (Signed)
  REMS Clozapine program Update: Patient & MD registered; Labs entered and acceptable.  Per Clozapine [facility] program, acceptable to continue medication. ANC monitoring required weekly.   Of note, pt zip code that is registered with REMS program is [phone].   Adalberto ColeNikola Clint Strupp, PharmD, BCPS Pager [phone] 11/10/2015 3:39 PM
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
## 2015-11-11 NOTE — ED Notes (Signed)
Patient noted sleeping in room. No complaints, stable, in no acute distress. Q15 minute rounds and monitoring via Security Cameras to continue.  

## 2015-11-11 NOTE — ED Notes (Signed)
Up to the bathroom 
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
## 2015-11-11 NOTE — ED Notes (Signed)
Report from Janie Rambo RN. Patient sleeping, respirations regular and unlabored. Q15 minute rounds and security camera observation to continue.   

## 2015-11-12 NOTE — ED Notes (Signed)
Patient noted sleeping in room. No complaints, stable, in no acute distress. Q15 minute rounds and monitoring via Security Cameras to continue.  

## 2015-11-12 NOTE — ED Notes (Signed)
Pt awake, alert & responsive, watching TV at present, no distress noted, calm & cooperative.  Monitoring for safety, Q 15 min checks in effect.

## 2015-11-12 NOTE — Progress Notes (Signed)
[facility] and [facility] will call when bed comes available.
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
## 2015-11-12 NOTE — Progress Notes (Signed)
Staffed with [facility] CSW who confirms she received a call from [facility] stating [facility] remains on [facility] wait list.   Elenore PaddyLaVonia Lucilla Petrenko, LCSWA [phone] ED CSW 11/12/2015 9:42 AM
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
## 2015-11-13 NOTE — ED Notes (Signed)
Pt has been in bed sleeping. When awake he has been cooperative. He does not try to interact with other patients, but stays in his room most of the time. He takes medications and he is eating his meals. States that he, "feels like hurting himself or others in waves."

## 2015-11-13 NOTE — Progress Notes (Signed)
EDCM spoke to patient at bedside. Patient confirms he does not have a pcp  [place].  [facility], informed patient of services there and walk in times.  EDCM also provided patient with list of pcps who accept self pay patients, list of discount pharmacies and websites needymeds.org and GoodRX.com for medication assistance, phone number to inquire about the orange card, phone number to inquire about Mediciad, phone number to inquire about the Affordable Care Act, financial resources in the community such as local churches, salvation army, urban ministries, and dental assistance for uninsured patients.  Patient thankful for resources.  No further EDCM needs at this time.  Patient confirms he is homeless.  EDCM discussed the [facility] with [facility].  [facility] provided [facility] with [facility].  Patient [facility].
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
## 2015-11-13 NOTE — Progress Notes (Signed)
Sp[facility] with Williams [facility]Hubbard [facility] who states patient remains on the wait list.   Elenore PaddyLaVonia Tracie Lindbloom, LCSWA [phone] ED CSW 11/13/2015 8:17 AM

## 2015-11-13 NOTE — ED Notes (Signed)
Pt AAO x 3, no distress noted, calm & cooperative, watching TV at present, monitoring for safety, Q 15 min checks in effect.

## 2015-11-14 ENCOUNTER — Emergency Department
Admission: EM | Admit: 2015-11-14 | Discharge: 2015-11-17 | Disposition: A | Discharge status: Other Acute Inpatient Hospital | Payer: Self-pay | Attending: Emergency Medicine | Admitting: Emergency Medicine

## 2015-11-14 DIAGNOSIS — F200 Paranoid schizophrenia: Secondary | ICD-10-CM | POA: Insufficient documentation

## 2015-11-14 LAB — CBC
HEMATOCRIT: 45.7 % (ref 40.0–52.0)
Hemoglobin: 15.9 g/dL (ref 13.0–18.0)
MCH: 30.8 pg (ref 26.0–34.0)
MCHC: 34.8 g/dL (ref 32.0–36.0)
MCV: 88.6 fL (ref 80.0–100.0)
Platelets: 306 10*3/uL (ref 150–440)
RBC: 5.16 MIL/uL (ref 4.40–5.90)
RDW: 13.9 % (ref 11.5–14.5)
WBC: 12.4 10*3/uL — AB (ref 3.8–10.6)

## 2015-11-14 MED ORDER — BENZTROPINE MESYLATE 0.5 MG PO TABS: 0.5000 mg | ORAL_TABLET | Freq: Two times a day (BID) | ORAL | Status: DC

## 2015-11-14 MED ORDER — CLOZAPINE 100 MG PO TABS: 100.0000 mg | ORAL_TABLET | Freq: Two times a day (BID) | ORAL | Status: DC

## 2015-11-14 NOTE — BH Assessment (Signed)
BHH Assessment Progress Note  Per Thedore MinsMojeed Akintayo, MD, this pt no longer requires psychiatric hospitalization at this time.  [facility] with information for supportive services for the homeless, as well as referral information for [place] and [place].  Printed information about services for the homeless has been given to pt's nurse for the pt.  Discharge instructions advise [facility] to [facility] [place], or with [facility] in [place].  Pt's nurse has been notified.  Kenneth Canninghomas Mahamud Metts, MA Triage Specialist [phone]
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
## 2015-11-14 NOTE — BHH Suicide Risk Assessment (Signed)
Suicide Risk Assessment  Discharge Assessment   [facility]   Principal Problem: Schizophrenia, paranoid type [facility](HCC) Discharge Diagnoses:  Patient Active Problem List   Diagnosis Date Noted  . Schizophrenia, paranoid type (HCC) [F20.0] 06/06/2014    Priority: High  . Noncompliance with medication regimen [Z91.14]   . Auditory hallucinations [R44.0]   . Visual hallucinations [R44.1]     Total Time spent with patient: 30 minutes  Musculoskeletal: Strength & Muscle Tone: within normal limits Gait & Station: normal Patient leans: N/A  Psychiatric Specialty Exam: Physical Exam  Constitutional: He is oriented to person, place, and time. He appears well-developed and well-nourished.  HENT:  Head: Normocephalic.  Neck: Normal range of motion.  Respiratory: Effort normal.  Musculoskeletal: Normal range of motion.  Neurological: He is alert and oriented to person, place, and time.  Skin: Skin is warm and dry.  Psychiatric: He has a normal mood and affect. His speech is normal and behavior is normal. Judgment and thought content normal. Cognition and memory are normal.    Review of Systems  Constitutional: Negative.   HENT: Negative.   Eyes: Negative.   Respiratory: Negative.   Cardiovascular: Negative.   Gastrointestinal: Negative.   Genitourinary: Negative.   Musculoskeletal: Negative.   Skin: Negative.   Neurological: Negative.   Endo/Heme/Allergies: Negative.   Psychiatric/Behavioral: Negative.     Blood pressure 112/67, pulse 78, temperature 97.9 F (36.6 C), temperature source Oral, resp. rate 18, height 6' (1.829 m), weight 113.399 kg (250 lb), SpO2 95 %.Body mass index is 33.9 kg/(m^2).  General Appearance: Casual  Eye Contact:  Good  Speech:  Normal Rate  Volume:  Normal  Mood:  Euthymic  Affect:  Appropriate  Thought Process:  Coherent and Descriptions of Associations: Intact  Orientation:  Full (Time, Place, and Person)  Thought  Content:  WDL  Suicidal Thoughts:  No  Homicidal Thoughts:  No  Memory:  Immediate;   Good Recent;   Good Remote;   Good  Judgement:  Fair  Insight:  Fair  Psychomotor Activity:  Normal  Concentration:  Concentration: Good and Attention Span: Good  Recall:  Good  Fund of Knowledge:  Good  Language:  Good  Akathisia:  No  Handed:  Right  AIMS (if indicated):     Assets:  Leisure Time Physical Health Resilience  ADL's:  Intact  Cognition:  WNL  Sleep:      Mental Status Per Nursing Assessment::   On Admission:   delusions  Demographic Factors:  Male and Divorced or widowed  Loss Factors: NA  Historical Factors: NA  Risk Reduction Factors:   Positive therapeutic relationship  Continued Clinical Symptoms:  None  Cognitive Features That Contribute To Risk:  None    Suicide Risk:  Minimal: No identifiable suicidal ideation.  Patients presenting with no risk factors but with morbid ruminations; may be classified as minimal risk based on the severity of the depressive symptoms    Plan Of Care/Follow-up recommendations:  Activity:  as tolerated Diet:  heart healthy diet  Stuart Mirabile, NP 11/14/2015, 11:43 AM

## 2015-11-14 NOTE — ED Notes (Addendum)
Pt states he is having visual and auditory hallucinations, was released from behavioral unit in [place] today. Pt states he was discharged without meds and is homeless. Pt states he feels suicidal.

## 2015-11-14 NOTE — Progress Notes (Signed)
Sp[facility] with [facility] who states patient remains on the wait list.   Elenore PaddyLaVonia Analilia Geddis, LCSWA [phone] ED CSW 11/14/2015 8:34 AM
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
## 2015-11-14 NOTE — Discharge Instructions (Signed)
For your ongoing mental health needs, you are advised to follow up with one of the following providers:       [facility]      [address]      [place], [place] [address]      [phone]      New and returning patients are seen at their walk-in clinic.  Walk-in hours are Monday - Friday from 8:00 am - 3:00 pm.  Walk-in patients are seen on a first come, first served basis.  Try to arrive as early as possible for he best chance of being seen the same day.       [facility]      [address]      [place], [place] [address]      [phone]

## 2015-11-15 ENCOUNTER — Encounter: Payer: Self-pay | Admitting: Psychiatry

## 2015-11-15 DIAGNOSIS — F200 Paranoid schizophrenia: Secondary | ICD-10-CM

## 2015-11-15 LAB — BASIC METABOLIC PANEL
ANION GAP: 9 (ref 5–15)
BUN: 19 mg/dL (ref 6–20)
CALCIUM: 9.8 mg/dL (ref 8.9–10.3)
CO2: 24 mmol/L (ref 22–32)
CREATININE: 1.16 mg/dL (ref 0.61–1.24)
Chloride: 106 mmol/L (ref 101–111)
Glucose, Bld: 102 mg/dL — ABNORMAL HIGH (ref 65–99)
Potassium: 3.8 mmol/L (ref 3.5–5.1)
SODIUM: 139 mmol/L (ref 135–145)

## 2015-11-15 LAB — URINALYSIS COMPLETE WITH MICROSCOPIC (ARMC ONLY)
BACTERIA UA: NONE SEEN
Bilirubin Urine: NEGATIVE
GLUCOSE, UA: NEGATIVE mg/dL
Ketones, ur: NEGATIVE mg/dL
Nitrite: NEGATIVE
PH: 5 (ref 5.0–8.0)
PROTEIN: NEGATIVE mg/dL
SPECIFIC GRAVITY, URINE: 1.011 (ref 1.005–1.030)
SQUAMOUS EPITHELIAL / LPF: NONE SEEN

## 2015-11-15 LAB — URINE DRUG SCREEN, QUALITATIVE (ARMC ONLY)
Amphetamines, Ur Screen: NOT DETECTED
BARBITURATES, UR SCREEN: NOT DETECTED
BENZODIAZEPINE, UR SCRN: NOT DETECTED
CANNABINOID 50 NG, UR ~~LOC~~: NOT DETECTED
COCAINE METABOLITE, UR ~~LOC~~: NOT DETECTED
MDMA (Ecstasy)Ur Screen: NOT DETECTED
METHADONE SCREEN, URINE: NOT DETECTED
Opiate, Ur Screen: NOT DETECTED
Phencyclidine (PCP) Ur S: NOT DETECTED
TRICYCLIC, UR SCREEN: NOT DETECTED

## 2015-11-15 LAB — ETHANOL

## 2015-11-15 MED ORDER — OLANZAPINE 5 MG PO TABS
15.0000 mg | ORAL_TABLET | Freq: Every day | ORAL | Status: DC
  Administered 2015-11-15: 15 mg via ORAL
  Filled 2015-11-15: qty 1

## 2015-11-15 NOTE — ED Provider Notes (Signed)
[facility] Emergency Department Provider Note  ____________________________________________  Time seen: 12:45AM  I have reviewed the triage vital signs and the nursing notes.   HISTORY  Chief Complaint Hallucinations     HPI Kenneth [name] is a 50 y.o. male with history of schizophrenia just released from [facility] yesterday presents with auditory hallucinations. Patient states voices telling him to hurt people. In addition patient admits to visual hallucinations stating that he sees "souls coming to him. Patient states that he was admitted at [facility] and discharged today but     No past medical history on file.  Patient Active Problem List   Diagnosis Date Noted  . Noncompliance with medication regimen   . Schizophrenia, paranoid type (HCC) 06/06/2014  . Auditory hallucinations   . Visual hallucinations     No past surgical history on file.  Current Outpatient Rx  Name  Route  Sig  Dispense  Refill  . benztropine (COGENTIN) 0.5 MG tablet   Oral   Take 1 tablet (0.5 mg total) by mouth 2 (two) times daily.   60 tablet   0   . cloZAPine (CLOZARIL) 100 MG tablet   Oral   Take 1 tablet (100 mg total) by mouth 2 (two) times daily.   60 tablet   0     Allergies Haldol; Loxapine; and Penicillins  No family history on file.  Social History Social History  Substance Use Topics  . Smoking status: Never Smoker   . Smokeless tobacco: Not on file  . Alcohol Use: No    Review of Systems  Constitutional: Negative for fever. Eyes: Negative for visual changes. ENT: Negative for sore throat. Cardiovascular: Negative for chest pain. Respiratory: Negative for shortness of breath. Gastrointestinal: Negative for abdominal pain, vomiting and diarrhea. Genitourinary: Negative for dysuria. Musculoskeletal: Negative for back pain. Skin: Negative for rash. Neurological: Negative for headaches, focal weakness or  numbness. Psychiatric:  10-point ROS otherwise negative.  ____________________________________________   PHYSICAL EXAM:  VITAL SIGNS: ED Triage Vitals  Enc Vitals Group     BP 11/14/15 2328 123/89 mmHg     Pulse Rate 11/14/15 2328 115     Resp 11/14/15 2328 20     Temp 11/14/15 2328 98.3 F (36.8 C)     Temp Source 11/14/15 2328 Oral     SpO2 11/14/15 2328 96 %     Weight 11/14/15 2326 250 lb (113.399 kg)     Height 11/14/15 2326 6' (1.829 m)     Head Cir --      Peak Flow --      Pain Score 11/14/15 2327 0     Pain Loc --      Pain Edu? --      Excl. in GC? --      Constitutional: Alert and oriented. Well appearing and in no distress. Eyes: Conjunctivae are normal. PERRL. Normal extraocular movements. ENT   Head: Normocephalic and atraumatic.   Nose: No congestion/rhinnorhea.   Mouth/Throat: Mucous membranes are moist.   Neck: No stridor. Hematological/Lymphatic/Immunilogical: No cervical lymphadenopathy. Cardiovascular: Normal rate, regular rhythm. Normal and symmetric distal pulses are present in all extremities. No murmurs, rubs, or gallops. Respiratory: Normal respiratory effort without tachypnea nor retractions. Breath sounds are clear and equal bilaterally. No wheezes/rales/rhonchi. Gastrointestinal: Soft and nontender. No distention. There is no CVA tenderness. Genitourinary: deferred Musculoskeletal: Nontender with normal range of motion in all extremities. No joint effusions.  No lower extremity tenderness nor edema. Neurologic:  Normal speech and language. No gross focal neurologic deficits are appreciated. Speech is normal.  Skin:  Skin is warm, dry and intact. No rash noted. Psychiatric: Mood and affect are normal. Speech and behavior are normal. Patient exhibits appropriate insight and judgment.  ____________________________________________    LABS (pertinent positives/negatives)  Labs Reviewed  CBC - Abnormal; Notable for the following:     WBC 12.4 (*)    All other components within normal limits  BASIC METABOLIC PANEL - Abnormal; Notable for the following:    Glucose, Bld 102 (*)    All other components within normal limits  URINALYSIS COMPLETEWITH MICROSCOPIC ([facility] ONLY) - Abnormal; Notable for the following:    Color, Urine YELLOW (*)    APPearance CLEAR (*)    Hgb urine dipstick 1+ (*)    Leukocytes, UA TRACE (*)    All other components within normal limits  ETHANOL  URINE DRUG SCREEN, QUALITATIVE ([facility] ONLY)         Procedures     INITIAL IMPRESSION / ASSESSMENT AND PLAN / ED COURSE  Pertinent labs & imaging results that were available during my care of the patient were reviewed by me and considered in my medical decision making (see chart for details).  Await psychiatry consultation  ____________________________________________   FINAL CLINICAL IMPRESSION(S) / ED DIAGNOSES  Final diagnoses:  Schizophrenia, paranoid type (HCC)      Darci Currentandolph N Brown, MD 11/15/15 2246

## 2015-11-15 NOTE — ED Notes (Signed)
Pt reports that he was just released from "[place] unit" in [place]. States, "They just put me back out on the streets without any medications or anything." Reports that he is having auditory hallucinations that at times tell him to hurt other people and visual hallucinations "that are like souls coming at me." Denies wanting to harm himself, but reports "sometimes I want to hurt other people, but no one specific." Pt reports that in the past he has been hospitalized at [facility] after he was in jail. States, "I'm trying to get back to [facility], I've been there before. It was after I was here. Dr. Demetrius CharityP was my doctor when I was here before." At time of assessment pt is calm and cooperative, does not appear to be having any hallucinations at present. Respirations even and unlabored, skin warm and dry.

## 2015-11-15 NOTE — ED Notes (Signed)
Report given to Ruthie, RN.  
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
## 2015-11-15 NOTE — ED Notes (Signed)
Explained to pt that DR is going to change his medications and we will keep overnight so that social work can help place him in a shelter that will provide some support for meds and appts stc.

## 2015-11-15 NOTE — ED Notes (Signed)
BEHAVIORAL HEALTH ROUNDING  Patient sleeping: Yes Patient alert and oriented: Sleeping Behavior appropriate: Yes. ; If no, describe:  Nutrition and fluids offered: No, sleeping  Toileting and hygiene offered: No, sleeping  Sitter present: q15 minute observations and security monitoring  Law enforcement present: Yes ODS 

## 2015-11-15 NOTE — Progress Notes (Signed)
  LCSW called [facility] and spoke to supervisor Sharlyne Caiharise Young . LCSW was informed Emi Belfastatoya Jackson is this patients Care coordinator and will not be in on Monday.  Sharlyne CaiCharise Young  CC Supervisor [phone] suggested the patient go to [facility].  LCSW [facility] and left a detailed message for  SW Francina AmesMegan Raymond [phone]  [facility] [place] [phone]  LCSW called [facility]- and made a referral to be reconnected to [facility] act team. LCSW left a detailed message and  Am awaiting a call back   Daquann Merriott LCSW

## 2015-11-15 NOTE — Progress Notes (Signed)
LCSW called [facility], Called [facility] Will contact act team [facility].   Kenneth Repka LCSW

## 2015-11-15 NOTE — ED Notes (Signed)
Dr Ardyth HarpsHernandez  spoke with pt inregards to ths plan

## 2015-11-15 NOTE — ED Notes (Signed)
Pt brought over from main ed, pt is alert and offers no c/o of pain, he states that no meds have ever helped his voices i mentioned several he denies all . Now he seems calm and cooperative he does state [facility] ,he is homeless and sick and can not make it on his own.

## 2015-11-15 NOTE — ED Notes (Signed)
BEHAVIORAL HEALTH ROUNDING Patient sleeping: No. Patient alert and oriented: yes Behavior appropriate: Yes.  ; If no, describe:  Nutrition and fluids offered: yes Toileting and hygiene offered: Yes  Sitter present: q15 minute observations and security  monitoring Law enforcement present: Yes  ODS  
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
## 2015-11-15 NOTE — ED Notes (Signed)
Patient was given breakfast tray. Patient woke up from rest to eat breakfast. Patient finished breakfast, and is now back in the bed.

## 2015-11-15 NOTE — ED Notes (Signed)
Pt given meal tray and drink.
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
## 2015-11-15 NOTE — ED Notes (Signed)
Pt offers no further  C/o at this time

## 2015-11-15 NOTE — ED Notes (Signed)
Dr Ardyth HarpsHernandez here

## 2015-11-15 NOTE — ED Notes (Signed)
Pt took a shower 

## 2015-11-16 MED ORDER — OLANZAPINE 10 MG PO TABS: 30.0000 mg | ORAL_TABLET | Freq: Every day | ORAL | Status: DC

## 2015-11-16 MED ORDER — OLANZAPINE 10 MG PO TABS
20.0000 mg | ORAL_TABLET | Freq: Once | ORAL | Status: AC
  Administered 2015-11-16: 20 mg via ORAL

## 2015-11-16 MED ORDER — OLANZAPINE 10 MG PO TABS
ORAL_TABLET | ORAL | Status: AC
  Administered 2015-11-16: 20 mg via ORAL
  Filled 2015-11-16: qty 2

## 2015-11-16 NOTE — Progress Notes (Signed)
LCSW called the following establishments for housing options. A  Allied shelters- refused patient on Tuesday night [facility] and left message Allayne ButcherCalled [facility] NO beds in any shelters and left message with director of [facility] awaiting call back [employer] [place] resource center and they reported [facility]( [place] [place] beds anywhere in [place]. [facility] [place] and was called back no beds available.   Plan so far for patient : According to TTS [facility] LCSW will fax over vitals, MAR and IVC documentation.  Consulted with ChiropodistAssistant director and psychiatrist Dr Ardyth HarpsHernandez and this is the better plan.   Delta Air LinesClaudine Ladon Vandenberghe LCSW [phone]
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
## 2015-11-16 NOTE — ED Notes (Addendum)
Patient resting quietly in room. No noted distress or abnormal behaviors noted. Will continue 15 minute checks and observation by security camera for safety. 

## 2015-11-16 NOTE — Progress Notes (Signed)
LCSW faxed over recently signed documentation IVC, vitals MAR and confirmed [facility]  [facility] Maudine Kluesner LCSW [phone]

## 2015-11-16 NOTE — ED Notes (Signed)
Patient resting quietly in room. No noted distress or abnormal behaviors noted. Will continue 15 minute checks and observation by security camera for safety. 

## 2015-11-16 NOTE — ED Notes (Signed)
RN brought pt breakfast tray. Pt did not offer much information, stated he was just waking up. RN will attempt to speak with pt after he has time to wake up. No distress noted. Maintained on 15 minute checks and observation by security camera for safety.

## 2015-11-16 NOTE — ED Notes (Signed)
Pt in day room speaking to another patient. No distress noted. Maintained on 15 minute checks and observation by security camera for safety.

## 2015-11-16 NOTE — ED Notes (Signed)
[facility] He will be discharged to this facility tomorrow per social work.

## 2015-11-16 NOTE — ED Notes (Signed)
Pt is alert and oriented this evening. Pt mood is depressed but he is pleasant and cooperative with staff. Pt denies SI but endorses auditory and visual hallucinations. Pt is c/o constipation X 2-3 days. Writer discussed tx plan, provided food and drink and 15 minute checks are ongoing for safety.

## 2015-11-16 NOTE — ED Provider Notes (Signed)
-----------------------------------------   6:59 AM on 11/16/2015 -----------------------------------------   Blood pressure 117/65, pulse 78, temperature 98.1 F (36.7 C), temperature source Oral, resp. rate 20, height 6' (1.829 m), weight 113.399 kg, SpO2 98 %.  The patient had no acute events since last update.  Calm and cooperative at this time.  Disposition is pending per Psychiatry/Behavioral Medicine team recommendations.  Patient is voluntary and it is possible he will be discharged today according to the paperwork provided by TTS/intake.     Loleta Roseory Terek Bee, MD 11/16/15 [phone]

## 2015-11-16 NOTE — ED Notes (Signed)
Patient is alert and orientedx4. Endorses AVH. States he talks to the voices. Also endorses passive SI but contracts for safety. Vital signs taken. Vitals SNL. Calm and cooperative. Safety maintained with 15 min checks.

## 2015-11-17 NOTE — Progress Notes (Signed)
LCSW called [facility] and spoke to [name] in Admissions and they have offered a bed today for Kenneth [name] and we can send him anytime. Called TTS and [facility]ayed [facility]. LCSW will consult with ED secretary to arrange transport and EDP of discharge.  [employer] Air LinesClaudine Arla Boutwell LCSW [phone]

## 2015-11-17 NOTE — Progress Notes (Signed)
LCSW was informed that [employer] will not be able to transport patient today ( no transportation officers as per ED secretary)  [facility] and [facility] too have no expendable officers [facility].  LCSW called [facility] spoke to Coronacaonnie and she was informed patient may not be transported today. We will consult again with [facility].  [employer] Air LinesClaudine Kortny Lirette LCSW [phone]

## 2015-11-17 NOTE — ED Notes (Signed)
Pt awake and already asking if they are [facility]

## 2015-11-17 NOTE — ED Provider Notes (Signed)
-----------------------------------------   9:00 AM on 11/17/2015 -----------------------------------------   Blood pressure 129/88, pulse 78, temperature 97.9 F (36.6 C), temperature source Oral, resp. rate 16, height 6' (1.829 m), weight 250 lb (113.399 kg), SpO2 99 %.  The patient had no acute events since last update.  Calm and cooperative at this time.  Disposition is pending per Psychiatry/Behavioral Medicine team recommendations.     Arnaldo NatalPaul F Ledia Hanford, MD 11/17/15 0900

## 2015-11-17 NOTE — ED Notes (Signed)

## 2015-11-17 NOTE — ED Notes (Signed)
Pt aware he is being transported to [facility] and he is aware and agreeable to the plan

## 2015-11-17 NOTE — ED Provider Notes (Signed)
-----------------------------------------   6:47 AM on 11/17/2015 -----------------------------------------   Blood pressure 129/88, pulse 78, temperature 97.9 F (36.6 C), temperature source Oral, resp. rate 16, height 6' (1.829 m), weight 250 lb (113.399 kg), SpO2 99 %.  The patient had no acute events since last update.  Calm and cooperative at this time.  Disposition is pending per Psychiatry/Behavioral Medicine team recommendations.     Irean HongJade J Sung, MD 11/17/15 [phone]

## 2016-01-22 ENCOUNTER — Emergency Department (HOSPITAL_COMMUNITY)
Admission: EM | Admit: 2016-01-22 | Discharge: 2016-01-23 | Disposition: A | Discharge status: Home / Self Care | Payer: No Typology Code available for payment source | Attending: Emergency Medicine | Admitting: Emergency Medicine

## 2016-01-22 ENCOUNTER — Encounter (HOSPITAL_COMMUNITY): Payer: Self-pay | Admitting: Emergency Medicine

## 2016-01-22 DIAGNOSIS — F17210 Nicotine dependence, cigarettes, uncomplicated: Secondary | ICD-10-CM | POA: Insufficient documentation

## 2016-01-22 DIAGNOSIS — F200 Paranoid schizophrenia: Secondary | ICD-10-CM | POA: Insufficient documentation

## 2016-01-22 DIAGNOSIS — Z79899 Other long term (current) drug therapy: Secondary | ICD-10-CM | POA: Insufficient documentation

## 2016-01-22 DIAGNOSIS — I10 Essential (primary) hypertension: Secondary | ICD-10-CM | POA: Insufficient documentation

## 2016-01-22 HISTORY — DX: Schizophrenia, unspecified: F20.9

## 2016-01-22 HISTORY — DX: Anxiety disorder, unspecified: F41.9

## 2016-01-22 LAB — CBC
HEMATOCRIT: 44.4 % (ref 39.0–52.0)
HEMOGLOBIN: 15.1 g/dL (ref 13.0–17.0)
MCH: 30.9 pg (ref 26.0–34.0)
MCHC: 34 g/dL (ref 30.0–36.0)
MCV: 91 fL (ref 78.0–100.0)
Platelets: 381 10*3/uL (ref 150–400)
RBC: 4.88 MIL/uL (ref 4.22–5.81)
RDW: 14.6 % (ref 11.5–15.5)
WBC: 13.1 10*3/uL — ABNORMAL HIGH (ref 4.0–10.5)

## 2016-01-22 LAB — COMPREHENSIVE METABOLIC PANEL
ALT: 73 U/L — ABNORMAL HIGH (ref 17–63)
AST: 48 U/L — AB (ref 15–41)
Albumin: 4.2 g/dL (ref 3.5–5.0)
Alkaline Phosphatase: 100 U/L (ref 38–126)
Anion gap: 8 (ref 5–15)
BILIRUBIN TOTAL: 0.4 mg/dL (ref 0.3–1.2)
BUN: 17 mg/dL (ref 6–20)
CALCIUM: 9.2 mg/dL (ref 8.9–10.3)
CO2: 24 mmol/L (ref 22–32)
CREATININE: 1.17 mg/dL (ref 0.61–1.24)
Chloride: 110 mmol/L (ref 101–111)
GLUCOSE: 132 mg/dL — AB (ref 65–99)
POTASSIUM: 4.2 mmol/L (ref 3.5–5.1)
Sodium: 142 mmol/L (ref 135–145)
Total Protein: 7.8 g/dL (ref 6.5–8.1)

## 2016-01-22 LAB — ETHANOL: Alcohol, Ethyl (B): <5 mg/dL (ref <5)

## 2016-01-22 LAB — ACETAMINOPHEN LEVEL

## 2016-01-22 LAB — SALICYLATE LEVEL: Salicylate Lvl: <4 mg/dL (ref 2.8–30.0)

## 2016-01-22 NOTE — ED Notes (Signed)
Pt belongings placed at nurses station. Pt currently in paper scrubs and sitter at bedside.

## 2016-01-22 NOTE — ED Provider Notes (Signed)
[facility]-EMERGENCY DEPT Provider Note   CSN: [id number] Arrival date & time: 01/22/16  2256   By signing my name below, I, Christel MormonMatthew Jamison, attest that this documentation has been prepared under the direction and in the presence of Shon Batonourtney F Horton, MD . Electronically Signed: Christel MormonMatthew Jamison, Scribe. 01/22/2016. 11:40 PM.   History   Chief Complaint Chief Complaint  Patient presents with  . Hallucinations    The history is provided by the patient. No language interpreter was used.   HPI Comments:  Kenneth [name] is a 50 y.o. male with PMHx of schizophrenia who presents to the Emergency Department complaining of hallucination and of hearing voices that have occurred over the past 2 days. Pt was discharged from [facility] yesterday and stopped taking one of his medications because it was not working. Pt [facility]. Pt states the voices sometimes tell him to hurt others and himself. Pt also reports that he looked at the eclipse and "saw his God name written across the eclipse" and that he threatened a child molester. Pt complains that his toenails are blue. Pt denies any current SI/HI.  Past Medical History:  Diagnosis Date  . Anxiety   . Schizophrenia [facility](HCC)     Patient Active Problem List   Diagnosis Date Noted  . Schizophrenia, paranoid type (HCC) 06/06/2014    History reviewed. No pertinent surgical history.     Home Medications    Prior to Admission medications   Medication Sig Start Date End Date Taking? Authorizing Provider  OLANZapine (ZYPREXA) 10 MG tablet Take 3 tablets (30 mg total) by mouth at bedtime. 11/16/15  Yes Jimmy FootmanAndrea Hernandez-Gonzalez, MD    Family History History reviewed. No pertinent family history.  Social History Social History  Substance Use Topics  . Smoking status: Current Every Day Smoker    Packs/day: 2.00    Types: Cigarettes  . Smokeless tobacco: Never Used  . Alcohol use Yes     Comment: social drinker      Allergies   Haldol [haloperidol]; Loxapine; and Penicillins   Review of Systems Review of Systems  Constitutional: Negative for fever.  Respiratory: Negative for shortness of breath.   Cardiovascular: Negative for chest pain.  Psychiatric/Behavioral: Positive for hallucinations. Negative for suicidal ideas.  All other systems reviewed and are negative.    Physical Exam Updated Vital Signs BP 134/80 (BP Location: Left Arm)   Pulse 105   Temp 98.3 F (36.8 C) (Oral)   Resp 18   Ht 5\' 11"  (1.803 m)   Wt 250 lb (113.4 kg)   SpO2 97%   BMI 34.87 kg/m   Physical Exam  Constitutional: He is oriented to person, place, and time. He appears well-developed and well-nourished.  Strange affect, disheveled  HENT:  Head: Normocephalic and atraumatic.  Cardiovascular: Normal rate, regular rhythm and normal heart sounds.   No murmur heard. Pulmonary/Chest: Effort normal and breath sounds normal. No respiratory distress. He has no wheezes.  Abdominal: Soft. There is no tenderness.  Neurological: He is alert and oriented to person, place, and time.  Skin: Skin is warm and dry.  Nursing note and vitals reviewed.    ED Treatments / Results  DIAGNOSTIC STUDIES:  Oxygen Saturation is 94% on RA, normal by my interpretation.    COORDINATION OF CARE:  11:40 PM Discussed treatment plan with pt at bedside and pt agreed to plan.   Labs (all labs ordered are listed, but only abnormal results are  displayed) Labs Reviewed  COMPREHENSIVE METABOLIC PANEL - Abnormal; Notable for the following:       Result Value   Glucose, Bld 132 (*)    AST 48 (*)    ALT 73 (*)    All other components within normal limits  ACETAMINOPHEN LEVEL - Abnormal; Notable for the following:    Acetaminophen (Tylenol), Serum <10 (*)    All other components within normal limits  CBC - Abnormal; Notable for the following:    WBC 13.1 (*)    All other components within normal limits  ETHANOL  SALICYLATE  LEVEL  URINE RAPID DRUG SCREEN, HOSP PERFORMED    EKG  EKG Interpretation None       Radiology No results found.  Procedures Procedures (including critical care time)  Medications Ordered in ED Medications  acetaminophen (TYLENOL) tablet 650 mg (not administered)     Initial Impression / Assessment and Plan / ED Course  I have reviewed the triage vital signs and the nursing notes.  Pertinent labs & imaging results that were available during my care of the patient were reviewed by me and considered in my medical decision making (see chart for details).  Clinical Course    Patient presents with hallucinations.  He is currently off of his medications.  Recent hospitalization at [facility].  Denies SI/HI.  TTS recommending a.m. psych eval.  Patient is medically cleared.  Final Clinical Impressions(s) / ED Diagnoses   Final diagnoses:  None    New Prescriptions New Prescriptions   No medications on file  I personally performed the services described in this documentation, which was scribed in my presence. The recorded information has been reviewed and is accurate.     Shon Baton, MD 01/23/16 [phone]

## 2016-01-22 NOTE — ED Triage Notes (Signed)
Pt reports hearing voices for the last two days. Pt states that he has not had his medications for the last 2 days. Pt is currently denying SI/HI. Pt states that voices have told him to harm others in the past.

## 2016-01-22 NOTE — ED Notes (Signed)
MD at bedside. 

## 2016-01-23 ENCOUNTER — Encounter: Payer: Self-pay | Admitting: Emergency Medicine

## 2016-01-23 ENCOUNTER — Emergency Department
Admission: EM | Admit: 2016-01-23 | Discharge: 2016-01-24 | Disposition: A | Discharge status: Home / Self Care | Payer: Self-pay | Attending: Student in an Organized Health Care Education/Training Program | Admitting: Student in an Organized Health Care Education/Training Program

## 2016-01-23 DIAGNOSIS — F602 Antisocial personality disorder: Secondary | ICD-10-CM

## 2016-01-23 DIAGNOSIS — F200 Paranoid schizophrenia: Secondary | ICD-10-CM

## 2016-01-23 DIAGNOSIS — R440 Auditory hallucinations: Secondary | ICD-10-CM

## 2016-01-23 DIAGNOSIS — F17210 Nicotine dependence, cigarettes, uncomplicated: Secondary | ICD-10-CM | POA: Insufficient documentation

## 2016-01-23 DIAGNOSIS — Z9119 Patient's noncompliance with other medical treatment and regimen: Secondary | ICD-10-CM

## 2016-01-23 DIAGNOSIS — I10 Essential (primary) hypertension: Secondary | ICD-10-CM | POA: Insufficient documentation

## 2016-01-23 DIAGNOSIS — Z91199 Patient's noncompliance with other medical treatment and regimen due to unspecified reason: Secondary | ICD-10-CM

## 2016-01-23 DIAGNOSIS — R45850 Homicidal ideations: Secondary | ICD-10-CM

## 2016-01-23 DIAGNOSIS — Z79899 Other long term (current) drug therapy: Secondary | ICD-10-CM | POA: Insufficient documentation

## 2016-01-23 LAB — COMPREHENSIVE METABOLIC PANEL
ALT: 67 U/L — ABNORMAL HIGH (ref 17–63)
ANION GAP: 6 (ref 5–15)
AST: 48 U/L — AB (ref 15–41)
Albumin: 4.1 g/dL (ref 3.5–5.0)
Alkaline Phosphatase: 96 U/L (ref 38–126)
BUN: 13 mg/dL (ref 6–20)
CHLORIDE: 105 mmol/L (ref 101–111)
CO2: 26 mmol/L (ref 22–32)
Calcium: 8.8 mg/dL — ABNORMAL LOW (ref 8.9–10.3)
Creatinine, Ser: 0.91 mg/dL (ref 0.61–1.24)
Glucose, Bld: 104 mg/dL — ABNORMAL HIGH (ref 65–99)
POTASSIUM: 3.9 mmol/L (ref 3.5–5.1)
Sodium: 137 mmol/L (ref 135–145)
Total Bilirubin: 0.8 mg/dL (ref 0.3–1.2)
Total Protein: 6.9 g/dL (ref 6.5–8.1)

## 2016-01-23 LAB — CBC
HCT: 42.8 % (ref 40.0–52.0)
Hemoglobin: 14.6 g/dL (ref 13.0–18.0)
MCH: 30.8 pg (ref 26.0–34.0)
MCHC: 34.1 g/dL (ref 32.0–36.0)
MCV: 90.5 fL (ref 80.0–100.0)
PLATELETS: 299 10*3/uL (ref 150–440)
RBC: 4.73 MIL/uL (ref 4.40–5.90)
RDW: 14.4 % (ref 11.5–14.5)
WBC: 7.6 10*3/uL (ref 3.8–10.6)

## 2016-01-23 LAB — RAPID URINE DRUG SCREEN, HOSP PERFORMED
Amphetamines: NOT DETECTED
BARBITURATES: NOT DETECTED
Benzodiazepines: NOT DETECTED
COCAINE: NOT DETECTED
Opiates: NOT DETECTED
TETRAHYDROCANNABINOL: NOT DETECTED

## 2016-01-23 LAB — URINE DRUG SCREEN, QUALITATIVE (ARMC ONLY)
AMPHETAMINES, UR SCREEN: NOT DETECTED
BENZODIAZEPINE, UR SCRN: NOT DETECTED
Barbiturates, Ur Screen: NOT DETECTED
Cannabinoid 50 Ng, Ur ~~LOC~~: NOT DETECTED
Cocaine Metabolite,Ur ~~LOC~~: NOT DETECTED
MDMA (ECSTASY) UR SCREEN: NOT DETECTED
METHADONE SCREEN, URINE: NOT DETECTED
Opiate, Ur Screen: NOT DETECTED
Phencyclidine (PCP) Ur S: NOT DETECTED
TRICYCLIC, UR SCREEN: NOT DETECTED

## 2016-01-23 LAB — SALICYLATE LEVEL

## 2016-01-23 LAB — ETHANOL

## 2016-01-23 LAB — ACETAMINOPHEN LEVEL

## 2016-01-23 MED ORDER — OLANZAPINE 10 MG PO TABS: 30.0000 mg | ORAL_TABLET | Freq: Every day | ORAL | Status: DC

## 2016-01-23 MED ORDER — OLANZAPINE 15 MG PO TABS: 30.0000 mg | ORAL_TABLET | Freq: Every day | ORAL | 0 refills | Status: DC

## 2016-01-23 MED ORDER — ACETAMINOPHEN 325 MG PO TABS: 650.0000 mg | ORAL_TABLET | ORAL | Status: DC | PRN

## 2016-01-23 MED ORDER — OLANZAPINE 10 MG PO TABS
30.0000 mg | ORAL_TABLET | Freq: Every day | ORAL | Status: DC
  Administered 2016-01-23: 30 mg via ORAL
  Filled 2016-01-23: qty 3

## 2016-01-23 NOTE — ED Triage Notes (Signed)
Pt reports hearing voices, telling him to hurt himself or others, sometimes other things. Pt reports some depression. Pt reports he's been off his medications 3-4 days.

## 2016-01-23 NOTE — ED Notes (Signed)
Pt discharged home per MD order. Discharge summary reviewed with pt. Pt verbalizes understanding of discharge summary. RX provided. Pt denies SI/HI. Denies AVH at discharge. Pt given bus ticket per pt request to get downtown. This nurse gave pt 3$ (given from social work department) to assist pt to go to [place] from downtown. Pt signed for personal property and property returned. Pt signed e-signature, ambulatory off unit.

## 2016-01-23 NOTE — ED Provider Notes (Addendum)
[facility] Emergency Department Provider Note    None    (approximate)  I have reviewed the triage vital signs and the nursing notes.   HISTORY  Chief Complaint Hallucinations    HPI Kenneth [name] is a 50 y.o. male with a history of anxiety and schizophrenia that is having active hallucinations and command hallucinations telling him to go kill people. Patient states is also having voices tell him that he is "god."   He states that he has been on Zyprexa but has not had any improvement with this medication so he stopped taking it 4 days ago. Was recently seen in [place] but states that it "didn't do anything for him."  Patient states that he presented to [facility] to stay with his stepmother as he was having paranoid that she was ill and needed his help.   Past Medical History:  Diagnosis Date  . Anxiety   . Schizophrenia [facility](HCC)     Patient Active Problem List   Diagnosis Date Noted  . Schizophrenia, paranoid type (HCC) 06/06/2014    History reviewed. No pertinent surgical history.  Prior to Admission medications   Medication Sig Start Date End Date Taking? Authorizing Provider  OLANZapine (ZYPREXA) 10 MG tablet Take 3 tablets (30 mg total) by mouth at bedtime. 11/16/15   Jimmy FootmanAndrea Hernandez-Gonzalez, MD  OLANZapine (ZYPREXA) 15 MG tablet Take 2 tablets (30 mg total) by mouth at bedtime. 01/23/16   Charm RingsJamison Y Lord, NP    Allergies Haldol [haloperidol]; Loxapine; and Penicillins  No family history on file.  Social History Social History  Substance Use Topics  . Smoking status: Current Every Day Smoker    Packs/day: 2.00    Types: Cigarettes  . Smokeless tobacco: Never Used  . Alcohol use Yes     Comment: social drinker    Review of Systems Patient denies headaches, rhinorrhea, blurry vision, numbness, shortness of breath, chest pain, edema, cough, abdominal pain, nausea, vomiting, diarrhea, dysuria, fevers, rashes or hallucinations  unless otherwise stated above in HPI. ____________________________________________   PHYSICAL EXAM:  VITAL SIGNS: Vitals:   01/23/16 1738  BP: (!) 162/86  Pulse: 96  Resp: 16  Temp: 97.7 F (36.5 C)    Constitutional: Alert and oriented. Well appearing and in no acute distress. Eyes: Conjunctivae are normal. PERRL. EOMI. Head: Atraumatic. Nose: No congestion/rhinnorhea. Mouth/Throat: Mucous membranes are moist.  Oropharynx non-erythematous. Neck: No stridor. Painless ROM. No cervical spine tenderness to palpation Hematological/Lymphatic/Immunilogical: No cervical lymphadenopathy. Cardiovascular: Normal rate, regular rhythm. Grossly normal heart sounds.  Good peripheral circulation. Respiratory: Normal respiratory effort.  No retractions. Lungs CTAB. Gastrointestinal: Soft and nontender. No distention. No abdominal bruits. No CVA tenderness. Genitourinary:  Musculoskeletal: No lower extremity tenderness nor edema.  No joint effusions. Neurologic:  Normal speech and language. No gross focal neurologic deficits are appreciated. No gait instability. Skin:  Skin is warm, dry and intact. No rash noted. Psychiatric: Does not appear to be actively hallucinating, Making multiple threats to harm others, difficult to follow his train of thought, no agitation at this time.  ____________________________________________   LABS (all labs ordered are listed, but only abnormal results are displayed)  Results for orders placed or performed during the hospital encounter of 01/23/16 (from the past 24 hour(s))  cbc     Status: None   Collection Time: 01/23/16  5:40 PM  Result Value Ref Range   WBC 7.6 3.8 - 10.6 K/uL   RBC 4.73 4.40 - 5.90 MIL/uL  Hemoglobin 14.6 13.0 - 18.0 g/dL   HCT 38.7 56.4 - 33.2 %   MCV 90.5 80.0 - 100.0 fL   MCH 30.8 26.0 - 34.0 pg   MCHC 34.1 32.0 - 36.0 g/dL   RDW 95.1 88.4 - 16.6 %   Platelets 299 150 - 440 K/uL    ____________________________________________  ____________________________________________  RADIOLOGY   ____________________________________________   PROCEDURES  Procedure(s) performed: none    Critical Care performed: no ____________________________________________   INITIAL IMPRESSION / ASSESSMENT AND PLAN / ED COURSE  Pertinent labs & imaging results that were available during my care of the patient were reviewed by me and considered in my medical decision making (see chart for details).  AYT:[id number], delirium, medication effect, noncompliance, polysubstance abuse, Si, Hi, depression   Kenneth [name] is a 50 y.o. who presents to the ED with for evaluation of paranoid delusions and command hallucinations.  Patient has psych history of schizophrenia.  Laboratory testing was ordered to evaluation for underlying electrolyte derangement or signs of underlying organic pathology to explain today's presentation.  Based on history and physical and laboratory evaluation, it appears that the patient's presentation is 2/2 underlying psychiatric disorder and will require further evaluation and management by inpatient psychiatry.  Patient was  made an IVC due to HI.  Disposition pending psychiatric evaluation.   Clinical Course  Comment By Time  Patient evaluated by psychiatry and felt to not meet inpatient criteria. At this time patient still making active threats with plan to go shoot bystanders and attempt to get treatment. 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  Patient will be placed in observation for reassessment. Willy Eddy, MD 09/13 2050     ____________________________________________   FINAL CLINICAL IMPRESSION(S) / ED DIAGNOSES  Final diagnoses:  Auditory hallucination  Homicidal ideations      NEW MEDICATIONS STARTED DURING THIS VISIT:  New Prescriptions    No medications on file     Note:  This document was prepared using Dragon voice recognition software and may include unintentional dictation errors.    Willy Eddy, MD 01/23/16 1847    Willy Eddy, MD 01/23/16 [phone]

## 2016-01-23 NOTE — Consult Note (Signed)
[facility] Face-to-Face Psychiatry Consult   Reason for Consult:  This is a 50-year-old man with a long history of behavior problems or comes to the emergency room requesting "help" Referring Physician:  Robinson Patient Identification: Kenneth [name] MRN:  [id number] Principal Diagnosis: Antisocial personality disorder Diagnosis:   Patient Active Problem List   Diagnosis Date Noted  . Antisocial personality disorder [F60.2] 01/23/2016  . Malingering [Z76.5] 01/23/2016  . Noncompliance [Z91.19] 01/23/2016  . Schizophrenia, paranoid type (HCC) [F20.0] 06/06/2014    Total Time spent with patient: 1 hour  Subjective:   Kenneth [name] is a 50 y.o. male patient admitted with "somebody needs to give me long-term help".  HPI:  Patient interviewed. Chart reviewed. Labs and vitals reviewed. This patient came to the emergency room today stating that he wanted "long-term help". He was at [facility] emergency room this morning and was released because there was no evidence that he required hospitalization. He came directly to our hospital and demanded to be admitted to the hospital. Patient will talk about how he has auditory hallucinations but at no point does he ever appear to be responding to internal stimuli or looking distracted. He will talk about suicidal or homicidal ideation but all of the talk about it is in the context of trying to manipulate his way into getting into the hospital. He admits that he never follows up with any of his prescription medicine or outpatient treatment. He was just at [facility] a couple days ago and was prescribed Zyprexa. Now he tells me that Zyprexa never works at all even though in the past it appears he was asking for it. He does say that he has not been drinking and has used only a small amount of marijuana. Patient's affect and behavior are calm and not bizarre. As it becomes more clear that he really does not need to be in the hospital he  turns to making threatening statements and reminding me that he has supposedly committed a great many violent crimes in the past. Asks me if I would prefer that he go committed armed robbery if I won't admit him to long-term hospital and keep him locked up for a month or so.  Social history: He has family here in [place] but they won't have anything to do with him. He is spent most of his life in jail or prison or mental hospitals. Has minimal or no social skills or abilities.  Medical history: High blood pressure sometimes. Doesn't follow-up with any other medical problems.  Substance abuse history: Actually doesn't seem to usually have much of a substance abuse problem.  Past Psychiatric History: Patient has a diagnosis of schizophrenia although most of his agitated behavior seems to be driven by secondary gain or obvious reasons for committing crimes. He tells me that he doesn't think medications of really ever been helpful and he rarely follows up with him. He has nevertheless had some very long hospitalizations including multiple long-term stays [facility] the state hospital. He does have a past history of suicide attempts.  Risk to Self: Is patient at risk for suicide?: Yes Risk to Others:   Prior Inpatient Therapy:   Prior Outpatient Therapy:    Past Medical History:  Past Medical History:  Diagnosis Date  . Anxiety   . Schizophrenia (HCC)    History reviewed. No pertinent surgical history. Family History: No family history on file. Family Psychiatric  History: Patient is not aware of any family history   of mental illness Social History:  History  Alcohol Use  . Yes    Comment: social drinker     History  Drug Use No    Social History   Social History  . Marital status: Legally Separated    Spouse name: N/A  . Number of children: N/A  . Years of education: N/A   Social History Main Topics  . Smoking status: Current Every Day Smoker    Packs/day: 2.00    Types:  Cigarettes  . Smokeless tobacco: Never Used  . Alcohol use Yes     Comment: social drinker  . Drug use: No  . Sexual activity: Not Asked   Other Topics Concern  . None   Social History Narrative  . None   Additional Social History:    Allergies:   Allergies  Allergen Reactions  . Haldol [Haloperidol] Other (See Comments)    "lock up"  . Loxapine     "Locks ups"  . Penicillins Other (See Comments)    Neck swells Has patient had a PCN reaction causing immediate rash, facial/tongue/throat swelling, SOB or lightheadedness with hypotension: yes Has patient had a PCN reaction causing severe rash involving mucus membranes or skin necrosis: no Has patient had a PCN reaction that required hospitalization:  no Has patient had a PCN reaction occurring within the last 10 years: no If all of the above answers are "NO", then may proceed with Cephalosporin use.     Labs:  Results for orders placed or performed during the hospital encounter of 01/23/16 (from the past 48 hour(s))  Comprehensive metabolic panel     Status: Abnormal   Collection Time: 01/23/16  5:40 PM  Result Value Ref Range   Sodium 137 135 - 145 mmol/L   Potassium 3.9 3.5 - 5.1 mmol/L   Chloride 105 101 - 111 mmol/L   CO2 26 22 - 32 mmol/L   Glucose, Bld 104 (H) 65 - 99 mg/dL   BUN 13 6 - 20 mg/dL   Creatinine, Ser 0.91 0.61 - 1.24 mg/dL   Calcium 8.8 (L) 8.9 - 10.3 mg/dL   Total Protein 6.9 6.5 - 8.1 g/dL   Albumin 4.1 3.5 - 5.0 g/dL   AST 48 (H) 15 - 41 U/L   ALT 67 (H) 17 - 63 U/L   Alkaline Phosphatase 96 38 - 126 U/L   Total Bilirubin 0.8 0.3 - 1.2 mg/dL   GFR calc non Af Amer >60 >60 mL/min   GFR calc Af Amer >60 >60 mL/min    Comment: (NOTE) The eGFR has been calculated using the CKD EPI equation. This calculation has not been validated in all clinical situations. eGFR's persistently <60 mL/min signify possible Chronic Kidney Disease.    Anion gap 6 5 - 15  Ethanol     Status: None   Collection  Time: 01/23/16  5:40 PM  Result Value Ref Range   Alcohol, Ethyl (B) <5 <5 mg/dL    Comment:        LOWEST DETECTABLE LIMIT FOR SERUM ALCOHOL IS 5 mg/dL FOR MEDICAL PURPOSES ONLY   Salicylate level     Status: None   Collection Time: 01/23/16  5:40 PM  Result Value Ref Range   Salicylate Lvl <4.0 2.8 - 30.0 mg/dL  Acetaminophen level     Status: Abnormal   Collection Time: 01/23/16  5:40 PM  Result Value Ref Range   Acetaminophen (Tylenol), Serum <10 (L) 10 - 30 ug/mL      Comment:        THERAPEUTIC CONCENTRATIONS VARY SIGNIFICANTLY. A RANGE OF 10-30 ug/mL MAY BE AN EFFECTIVE CONCENTRATION FOR MANY PATIENTS. HOWEVER, SOME ARE BEST TREATED AT CONCENTRATIONS OUTSIDE THIS RANGE. ACETAMINOPHEN CONCENTRATIONS >150 ug/mL AT 4 HOURS AFTER INGESTION AND >50 ug/mL AT 12 HOURS AFTER INGESTION ARE OFTEN ASSOCIATED WITH TOXIC REACTIONS.   cbc     Status: None   Collection Time: 01/23/16  5:40 PM  Result Value Ref Range   WBC 7.6 3.8 - 10.6 K/uL   RBC 4.73 4.40 - 5.90 MIL/uL   Hemoglobin 14.6 13.0 - 18.0 g/dL   HCT 42.8 40.0 - 52.0 %   MCV 90.5 80.0 - 100.0 fL   MCH 30.8 26.0 - 34.0 pg   MCHC 34.1 32.0 - 36.0 g/dL   RDW 14.4 11.5 - 14.5 %   Platelets 299 150 - 440 K/uL    Current Facility-Administered Medications  Medication Dose Route Frequency Provider Last Rate Last Dose  . OLANZapine (ZYPREXA) tablet 30 mg  30 mg Oral QHS Patrick Robinson, MD       Current Outpatient Prescriptions  Medication Sig Dispense Refill  . OLANZapine (ZYPREXA) 10 MG tablet Take 3 tablets (30 mg total) by mouth at bedtime. 90 tablet 0  . OLANZapine (ZYPREXA) 15 MG tablet Take 2 tablets (30 mg total) by mouth at bedtime. 30 tablet 0    Musculoskeletal: Strength & Muscle Tone: within normal limits Gait & Station: normal Patient leans: N/A  Psychiatric Specialty Exam: Physical Exam  Nursing note and vitals reviewed. Constitutional: He appears well-developed and well-nourished.  HENT:  Head:  Normocephalic and atraumatic.  Eyes: Conjunctivae are normal. Pupils are equal, round, and reactive to light.  Neck: Normal range of motion.  Cardiovascular: Regular rhythm and normal heart sounds.   Respiratory: Effort normal. No respiratory distress.  GI: Soft.  Musculoskeletal: Normal range of motion.  Neurological: He is alert.  Skin: Skin is warm and dry.  Psychiatric: He has a normal mood and affect. His speech is normal and behavior is normal. Cognition and memory are normal. He expresses impulsivity. He expresses homicidal and suicidal ideation. He expresses homicidal plans. He expresses no suicidal plans.    Review of Systems  Constitutional: Negative.   HENT: Negative.   Eyes: Negative.   Respiratory: Negative.   Cardiovascular: Negative.   Gastrointestinal: Negative.   Musculoskeletal: Negative.   Skin: Negative.   Neurological: Negative.   Psychiatric/Behavioral: Positive for depression, hallucinations and suicidal ideas. Negative for memory loss and substance abuse. The patient is nervous/anxious and has insomnia.     Blood pressure (!) 162/86, pulse 96, temperature 97.7 F (36.5 C), temperature source Oral, resp. rate 16, height 5' 11" (1.803 m), weight 118.8 kg (262 lb), SpO2 98 %.Body mass index is 36.54 kg/m.  General Appearance: Casual  Eye Contact:  Good  Speech:  Normal Rate  Volume:  Normal  Mood:  Dysphoric  Affect:  Congruent  Thought Process:  Goal Directed  Orientation:  Full (Time, Place, and Person)  Thought Content:  Logical  Suicidal Thoughts:  Yes.  without intent/plan  Homicidal Thoughts:  Yes.  without intent/plan  Memory:  Immediate;   Good Recent;   Fair Remote;   Fair  Judgement:  Fair  Insight:  Fair  Psychomotor Activity:  Normal  Concentration:  Concentration: Fair  Recall:  Fair  Fund of Knowledge:  Fair  Language:  Fair  Akathisia:  No  Handed:  Right  AIMS (if   indicated):     Assets:  Communication Skills Physical Health   ADL's:  Intact  Cognition:  WNL  Sleep:        Treatment Plan Summary: Plan 50-year-old man who presents here essentially asking to be admitted to the hospital and then making it clear that he wants to be in some kind of mental hospital for a long time. He tells me that a year is really not remotely long enough. He has no idea what actual specific help or treatment that he needs and has shown little or no skill at taking care of himself outside of a locked environment. Nevertheless he does not really have any indication for needing psychiatric hospitalization and is not likely to benefit from it. He does have a long history of criminal activity and is very antisocial in his outlook and behavior. Very manipulative. Likely to be disruptive and dangerous in the psychiatric ward and not have any benefit. Patient does not require hospitalization or meet commitment criteria. He is recommended to be released from the emergency room and to follow-up with local providers and stay on his Zyprexa. Case reviewed with TTS and emergency room staff.  Disposition: Patient does not meet criteria for psychiatric inpatient admission.   , MD 01/23/2016 7:44 PM 

## 2016-01-23 NOTE — ED Notes (Signed)
Awaiting TTS evaluation at this time.

## 2016-01-23 NOTE — ED Notes (Signed)
SBAR Report received from previous nurse. Pt received calm and visible on unit. Pt denies current SI/ HI, A/V H, depression, anxiety, and pain at this time, and is otherwise stable. Pt reminded of camera surveillance, q 15 min rounds, and rules of the milieu. Will continue to assess. 

## 2016-01-23 NOTE — BH Assessment (Signed)
Assessment completed. Consulted with Donell SievertSpencer Simon, PA-C who recommends patient be observed overnight and evaluated in the morning by psychiatry.  Davina PokeJoVea Hanny Elsberry, LCSW Therapeutic Triage Specialist [facility] 01/23/2016 2:00 AM

## 2016-01-23 NOTE — ED Notes (Signed)
Pt is alert and oriented this evening. Pt mood is sad but he is pleasant and cooperative with staff. Pt endorses AH with thoughts of harming self but states that he feels safe at this time. Food and drink provided and 15 minute checks are ongoing for safety.

## 2016-01-23 NOTE — ED Notes (Signed)
Pt cooperative. Reports he did not sleep last night. Pt denies SI. Endorsing HI "every once in a while," with no person identified. Pt endorsing auditory hallucinations. Special checks q 15 mins in place for safety. Video monitoring in place.

## 2016-01-23 NOTE — [facility] Suicide Risk Assessment (Signed)
Suicide Risk Assessment  Discharge Assessment   [facility]   Principal Problem: Schizophrenia, paranoid type [facility](HCC) Discharge Diagnoses:  Patient Active Problem List   Diagnosis Date Noted  . Schizophrenia, paranoid type (HCC) [F20.0] 06/06/2014    Priority: High    Total Time spent with patient: 45 minutes  Musculoskeletal: Strength & Muscle Tone: within normal limits Gait & Station: normal Patient leans: N/A  Psychiatric Specialty Exam: Physical Exam  Constitutional: He is oriented to person, place, and time. He appears well-developed and well-nourished.  HENT:  Head: Normocephalic.  Neck: Normal range of motion.  Respiratory: Effort normal.  Musculoskeletal: Normal range of motion.  Neurological: He is alert and oriented to person, place, and time.  Skin: Skin is warm and dry.  Psychiatric: He has a normal mood and affect. His speech is normal and behavior is normal. Judgment and thought content normal. Cognition and memory are normal.    Review of Systems  Constitutional: Negative.   HENT: Negative.   Eyes: Negative.   Respiratory: Negative.   Cardiovascular: Negative.   Gastrointestinal: Negative.   Genitourinary: Negative.   Musculoskeletal: Negative.   Skin: Negative.   Neurological: Negative.   Endo/Heme/Allergies: Negative.   Psychiatric/Behavioral: Negative.     Blood pressure (!) 94/47, pulse 100, temperature 98.7 F (37.1 C), temperature source Oral, resp. rate 18, height 5\' 11"  (1.803 m), weight 113.4 kg (250 lb), SpO2 97 %.Body mass index is 34.87 kg/m.  General Appearance: Casual  Eye Contact:  Good  Speech:  Normal Rate  Volume:  Normal  Mood:  Euthymic  Affect:  Congruent  Thought Process:  Coherent and Descriptions of Associations: Intact  Orientation:  Full (Time, Place, and Person)  Thought Content:  WDL  Suicidal Thoughts:  No  Homicidal Thoughts:  No  Memory:  Immediate;   Good Recent;   Good Remote;   Good   Judgement:  Fair  Insight:  Fair  Psychomotor Activity:  Normal  Concentration:  Concentration: Good and Attention Span: Good  Recall:  Good  Fund of Knowledge:  Fair  Language:  Good  Akathisia:  No  Handed:  Right  AIMS (if indicated):     Assets:  Leisure Time Physical Health Resilience  ADL's:  Intact  Cognition:  WNL  Sleep:       Mental Status Per Nursing Assessment::   On Admission:   hallucinations  Demographic Factors:  Male and Caucasian  Loss Factors: NA  Historical Factors: NA  Risk Reduction Factors:   Positive therapeutic relationship  Continued Clinical Symptoms:  None  Cognitive Features That Contribute To Risk:  None    Suicide Risk:  Minimal: No identifiable suicidal ideation.  Patients presenting with no risk factors but with morbid ruminations; may be classified as minimal risk based on the severity of the depressive symptoms    Plan Of Care/Follow-up recommendations:  Activity:  as tolerated Diet:  heart healthy diet  Sophie Tamez, NP 01/23/2016, 10:31 AM

## 2016-01-23 NOTE — Consult Note (Signed)
[facility] Consult   Reason for Consult:  Hallucinations  Referring Physician:  EDP Patient Identification: Kenneth [name] MRN:  [id number] Principal Diagnosis: Schizophrenia, paranoid type [facility]) Diagnosis:   Patient Active Problem List   Diagnosis Date Noted  . Schizophrenia, paranoid type ([place]) [F20.0] 06/06/2014    Priority: High    Total Time spent with patient: 45 minutes  Subjective:   Kenneth [name] is a 50 y.o. male patient does not warrant admission.  HPI:  50 yo male who presented to the ED with auditory hallucinations.  Medications were restarted and today, he denies suicidal/homicidal ideations, hallucinations, and alcohol/drug abuse.  He just left [facility] 2 days ago and reports he always stops taking his medications after he is discharge from the hospital.  Rx will be given along with a bus pass to [place] where he can go to the shelter and his outpatient providers.  Encouraged to take his medications to prevent auditory hallucinations.  Past Psychiatric History: schizophrenia  Risk to Self: Suicidal Ideation: No Suicidal Intent: No Is patient at risk for suicide?: No Suicidal Plan?: No Access to Means: No What has been your use of drugs/alcohol within the last 12 months?: Denies How many times?: 1 (2010) Other Self Harm Risks: Denies Triggers for Past Attempts: None known Intentional Self Injurious Behavior: None Risk to Others: Homicidal Ideation: No Thoughts of Harm to Others: No Current Homicidal Intent: No Current Homicidal Plan: No Access to Homicidal Means: No Identified Victim: Denies History of harm to others?: No Assessment of Violence: None Noted Violent Behavior Description: Denies Does patient have access to weapons?: No Criminal Charges Pending?: No Does patient have a court date: No Prior Inpatient Therapy: Prior Inpatient Therapy: Yes Prior Therapy Dates: 01/2016 Prior Therapy Facilty/Provider(s):  [facility] Reason for Treatment: Schizoohrenia  Prior Outpatient Therapy: Prior Outpatient Therapy: No Prior Therapy Dates: N/A Prior Therapy Facilty/Provider(s): N/A Reason for Treatment: N/A Does patient have an ACCT team?: No Does patient have Intensive In-House Services?  : No Does patient have [facility] services? : No Does patient have P4CC services?: No  Past Medical History:  Past Medical History:  Diagnosis Date  . Anxiety   . Schizophrenia ([place])    History reviewed. No pertinent surgical history. Family History: History reviewed. No pertinent family history. Family Psychiatric  History: none Social History:  History  Alcohol Use  . Yes    Comment: social drinker     History  Drug Use No    Social History   Social History  . Marital status: Legally Separated    Spouse name: N/A  . Number of children: N/A  . Years of education: N/A   Social History Main Topics  . Smoking status: Current Every Day Smoker    Packs/day: 2.00    Types: Cigarettes  . Smokeless tobacco: Never Used  . Alcohol use Yes     Comment: social drinker  . Drug use: No  . Sexual activity: Not Asked   Other Topics Concern  . None   Social History Narrative  . None   Additional Social History:    Allergies:   Allergies  Allergen Reactions  . Haldol [Haloperidol] Other (See Comments)    "lock up"  . Loxapine     "Locks ups"  . Penicillins Other (See Comments)    Neck swells Has patient had a PCN reaction causing immediate rash, facial/tongue/throat swelling, SOB or lightheadedness with hypotension: yes Has patient had a PCN reaction causing  severe rash involving mucus membranes or skin necrosis: no Has patient had a PCN reaction that required hospitalization:  no Has patient had a PCN reaction occurring within the last 10 years: no If all of the above answers are "NO", then may proceed with Cephalosporin use.     Labs:  Results for orders placed or performed during the hospital  encounter of 01/22/16 (from the past 48 hour(s))  Comprehensive metabolic panel     Status: Abnormal   Collection Time: 01/22/16 11:19 PM  Result Value Ref Range   Sodium 142 135 - 145 mmol/L   Potassium 4.2 3.5 - 5.1 mmol/L   Chloride 110 101 - 111 mmol/L   CO2 24 22 - 32 mmol/L   Glucose, Bld 132 (H) 65 - 99 mg/dL   BUN 17 6 - 20 mg/dL   Creatinine, Ser 1.17 0.61 - 1.24 mg/dL   Calcium 9.2 8.9 - 10.3 mg/dL   Total Protein 7.8 6.5 - 8.1 g/dL   Albumin 4.2 3.5 - 5.0 g/dL   AST 48 (H) 15 - 41 U/L   ALT 73 (H) 17 - 63 U/L   Alkaline Phosphatase 100 38 - 126 U/L   Total Bilirubin 0.4 0.3 - 1.2 mg/dL   GFR calc non Af Amer >60 >60 mL/min   GFR calc Af Amer >60 >60 mL/min    Comment: (NOTE) The eGFR has been calculated using the CKD EPI equation. This calculation has not been validated in all clinical situations. eGFR's persistently <60 mL/min signify possible Chronic Kidney Disease.    Anion gap 8 5 - 15  Ethanol     Status: None   Collection Time: 01/22/16 11:19 PM  Result Value Ref Range   Alcohol, Ethyl (B) <5 <5 mg/dL    Comment:        LOWEST DETECTABLE LIMIT FOR SERUM ALCOHOL IS 5 mg/dL FOR MEDICAL PURPOSES ONLY   Salicylate level     Status: None   Collection Time: 01/22/16 11:19 PM  Result Value Ref Range   Salicylate Lvl <0.9 2.8 - 30.0 mg/dL  Acetaminophen level     Status: Abnormal   Collection Time: 01/22/16 11:19 PM  Result Value Ref Range   Acetaminophen (Tylenol), Serum <10 (L) 10 - 30 ug/mL    Comment:        THERAPEUTIC CONCENTRATIONS VARY SIGNIFICANTLY. A RANGE OF 10-30 ug/mL MAY BE AN EFFECTIVE CONCENTRATION FOR MANY PATIENTS. HOWEVER, SOME ARE BEST TREATED AT CONCENTRATIONS OUTSIDE THIS RANGE. ACETAMINOPHEN CONCENTRATIONS >150 ug/mL AT 4 HOURS AFTER INGESTION AND >50 ug/mL AT 12 HOURS AFTER INGESTION ARE OFTEN ASSOCIATED WITH TOXIC REACTIONS.   cbc     Status: Abnormal   Collection Time: 01/22/16 11:19 PM  Result Value Ref Range   WBC 13.1  (H) 4.0 - 10.5 K/uL   RBC 4.88 4.22 - 5.81 MIL/uL   Hemoglobin 15.1 13.0 - 17.0 g/dL   HCT 44.4 39.0 - 52.0 %   MCV 91.0 78.0 - 100.0 fL   MCH 30.9 26.0 - 34.0 pg   MCHC 34.0 30.0 - 36.0 g/dL   RDW 14.6 11.5 - 15.5 %   Platelets 381 150 - 400 K/uL  Rapid urine drug screen (hospital performed)     Status: None   Collection Time: 01/23/16 12:08 AM  Result Value Ref Range   Opiates NONE DETECTED NONE DETECTED   Cocaine NONE DETECTED NONE DETECTED   Benzodiazepines NONE DETECTED NONE DETECTED   Amphetamines NONE DETECTED NONE DETECTED  Tetrahydrocannabinol NONE DETECTED NONE DETECTED   Barbiturates NONE DETECTED NONE DETECTED    Comment:        DRUG SCREEN FOR MEDICAL PURPOSES ONLY.  IF CONFIRMATION IS NEEDED FOR ANY PURPOSE, NOTIFY LAB WITHIN 5 DAYS.        LOWEST DETECTABLE LIMITS FOR URINE DRUG SCREEN Drug Class       Cutoff (ng/mL) Amphetamine      1000 Barbiturate      200 Benzodiazepine   779 Tricyclics       390 Opiates          300 Cocaine          300 THC              50     Current Facility-Administered Medications  Medication Dose Route Frequency Provider Last Rate Last Dose  . acetaminophen (TYLENOL) tablet 650 mg  650 mg Oral Q4H PRN Merryl Hacker, MD       Current Outpatient Prescriptions  Medication Sig Dispense Refill  . OLANZapine (ZYPREXA) 10 MG tablet Take 3 tablets (30 mg total) by mouth at bedtime. 90 tablet 0    Musculoskeletal: Strength & Muscle Tone: within normal limits Gait & Station: normal Patient leans: N/A  Psychiatric Specialty Exam: Physical Exam  Constitutional: He is oriented to person, place, and time. He appears well-developed and well-nourished.  HENT:  Head: Normocephalic.  Neck: Normal range of motion.  Respiratory: Effort normal.  Musculoskeletal: Normal range of motion.  Neurological: He is alert and oriented to person, place, and time.  Skin: Skin is warm and dry.  Psychiatric: He has a normal mood and affect. His  speech is normal and behavior is normal. Judgment and thought content normal. Cognition and memory are normal.    Review of Systems  Constitutional: Negative.   HENT: Negative.   Eyes: Negative.   Respiratory: Negative.   Cardiovascular: Negative.   Gastrointestinal: Negative.   Genitourinary: Negative.   Musculoskeletal: Negative.   Skin: Negative.   Neurological: Negative.   Endo/Heme/Allergies: Negative.   Psychiatric/Behavioral: Negative.     Blood pressure (!) 94/47, pulse 100, temperature 98.7 F (37.1 C), temperature source Oral, resp. rate 18, height 5' 11"  (1.803 m), weight 113.4 kg (250 lb), SpO2 97 %.Body mass index is 34.87 kg/m.  General Appearance: Casual  Eye Contact:  Good  Speech:  Normal Rate  Volume:  Normal  Mood:  Euthymic  Affect:  Congruent  Thought Process:  Coherent and Descriptions of Associations: Intact  Orientation:  Full (Time, Place, and Person)  Thought Content:  WDL  Suicidal Thoughts:  No  Homicidal Thoughts:  No  Memory:  Immediate;   Good Recent;   Good Remote;   Good  Judgement:  Fair  Insight:  Fair  Psychomotor Activity:  Normal  Concentration:  Concentration: Good and Attention Span: Good  Recall:  Good  Fund of Knowledge:  Fair  Language:  Good  Akathisia:  No  Handed:  Right  AIMS (if indicated):     Assets:  Leisure Time Physical Health Resilience  ADL's:  Intact  Cognition:  WNL  Sleep:        Treatment Plan Summary: Daily contact with patient to assess and evaluate symptoms and progress in treatment, Medication management and Plan schizophrenia, paranoid type:  -Crisis stabilization -Medication management:  Restarted Zyprexa 30 mg daily for psychosis -Individual counseling  Disposition: No evidence of imminent risk to self or others at present.  Waylan Boga, NP 01/23/2016 10:26 AM  Patient seen face-to-face for psychiatric evaluation, chart reviewed and case discussed with the physician extender and  developed treatment plan. Reviewed the information documented and agree with the treatment plan. Corena Pilgrim, MD

## 2016-01-23 NOTE — ED Notes (Signed)
TTS evaluator at bedside.
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
## 2016-01-24 MED ORDER — OLANZAPINE 15 MG PO TABS: 30.0000 mg | ORAL_TABLET | Freq: Every day | ORAL | 0 refills | Status: DC

## 2016-01-24 NOTE — ED Provider Notes (Signed)
-----------------------------------------   6:42 AM on 01/24/2016 -----------------------------------------   Blood pressure 119/61, pulse 81, temperature 98.5 F (36.9 C), temperature source Oral, resp. rate 18, height 5\' 11"  (1.803 m), weight 262 lb (118.8 kg), SpO2 99 %.  The patient had no acute events since last update.  Calm and cooperative at this time.  Disposition is pending Psychiatry/Behavioral Medicine team recommendations.     Irean HongJade J Rhylie Stehr, MD 01/24/16 [phone]

## 2016-01-24 NOTE — ED Notes (Signed)
ivc  Papers  Rescinded  Per  Dr  Clapacs/  Informed  [facility]  RN
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
## 2016-01-24 NOTE — ED Notes (Signed)
Patient took a shower.  Patient has been noticed talking with a peer and also with staff. Behavior is calm. No evidence that patient is responding to internal stimuli.Maintained on 15 minute checks and observation by security camera for safety.

## 2016-01-24 NOTE — ED Notes (Signed)
Patient received lunch tray and beverage. 

## 2016-01-24 NOTE — Discharge Instructions (Signed)
You were evaluated by Dr. Toni Amendlapacs, psychiatrist, after making threats towards others.  You are being discharged from the emergency department, after evaluation and discussion of plan to maintain safety of herself and others. We discussed that walking away was your Main strategy, and you should continue this, as it has worked well.  Return to the ER for any worsening condition including depression, or thoughts or actions of harm to yourself or others.  You are provided two possible follow up doctor offices for primary care -- [facility]. You are provided the contact info [facility].

## 2016-01-24 NOTE — ED Notes (Signed)
Patient discharged to self. He denies SI or HI. Discharge instructions reviewed with patient, he verbalizes understanding. Patient received copy of DC plan, 7 day supply of medications, bus ticket, and all personal belongings.
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
## 2016-01-24 NOTE — ED Notes (Signed)
Patient denies any HI. He says he never said he "would take out [place] Patient says he is upset that he keeps coming to the hospital and "they know I am homeless and they keep kicking me out to the street." RN discussed local resources with him but he said "they don't help." Maintained on 15 minute checks and observation by security camera for safety.

## 2016-01-24 NOTE — ED Notes (Signed)
Patient resting quietly in room. No noted distress or abnormal behaviors noted. Will continue 15 minute checks and observation by security camera for safety. 

## 2016-01-24 NOTE — Consult Note (Signed)
[facility] Consult   Reason for Consult:  Consult to follow-up with this 50 year old man with a history of schizophrenia but also a long history of antisocial personality and poor social functioning Referring Physician:  Reita Cliche Patient Identification: Kenneth [name] MRN:  [id number] Principal Diagnosis: Antisocial personality disorder Diagnosis:   Patient Active Problem List   Diagnosis Date Noted  . Antisocial personality disorder [F60.2] 01/23/2016  . Malingering [Z76.5] 01/23/2016  . Noncompliance [Z91.19] 01/23/2016  . Schizophrenia, paranoid type ([place]) [F20.0] 06/06/2014    Total Time spent with patient: 1 hour  Subjective:   Kenneth [name] is a 50 y.o. male patient admitted with "I've got no place to go".  HPI:  This is a reevaluation for this patient. I saw him yesterday afternoon in the emergency room. After I had suggested discharge the patient spoke with the emergency room doctor on call and appears to of explicitly made threats to "kill everyone in [facility]". He was placed back under involuntary commitment. This morning I discussed the case with the emergency room doctor on call at the moment, review the case with nursing and spoken to the patient again. Patient has not been aggressive violent dangerous or suicidal since being here overnight. He slept appropriately. He has been eating normally and took his medicine. Patient tells me that he does not actually have any plans to kill anyone. He is frustrated that he has no place to stay. He says he is frustrated that he would not be able to afford his medicine. He is not currently acting out in a violent way and does not express suicidal thoughts. At this point I think my conclusion is the same as before. This patient has a history of poor social functioning and antisocial behavior and may be at some risk of antisocial or even dangerous behavior in the future but there is no evidence that that would be the  result of psychosis or mental illness. Patient will be given a bus pass and says now that he thinks he can stay at the shelter. He will be referred once again to [facility] for outpatient treatment. We are going to give him a 7 day supply of his Zyprexa at the current dose from the pharmacy so that he will have that for at least a week to stay on. He will be given information about ways to try to get low cost or free medication management. Patient will be taken off commitment again and can be released from the emergency room at the approval of the ER doctor  Past Psychiatric History: Long history of psychosis at times and antisocial behavior. See old notes.  Risk to Self: Suicidal Ideation: No Suicidal Intent: No Is patient at risk for suicide?: No Suicidal Plan?: No Access to Means: No What has been your use of drugs/alcohol within the last 12 months?: Denies How many times?: 1 Other Self Harm Risks: None identified Triggers for Past Attempts: None known Intentional Self Injurious Behavior: None Risk to Others: Homicidal Ideation: Yes-Currently Present Thoughts of Harm to Others: Yes-Currently Present Comment - Thoughts of Harm to Others: Patient makes vague statements of HI towards others. Current Homicidal Intent: No Current Homicidal Plan: No Access to Homicidal Means: No Identified Victim: None identified History of harm to others?: No Assessment of Violence: None Noted Violent Behavior Description: None identified Does patient have access to weapons?: No Criminal Charges Pending?: No Does patient have a court date: No Prior Inpatient Therapy: Prior Inpatient Therapy: Yes  Prior Therapy Dates: 01/2016 Prior Therapy Facilty/Provider(s): [facility] Reason for Treatment: Schizoohrenia  Prior Outpatient Therapy: Prior Outpatient Therapy: No Prior Therapy Dates: N/A Prior Therapy Facilty/Provider(s): N/A Reason for Treatment: N/A Does patient have an ACCT team?: No Does patient have Intensive  In-House Services?  : No Does patient have [facility] services? : No Does patient have P4CC services?: No  Past Medical History:  Past Medical History:  Diagnosis Date  . Anxiety   . Schizophrenia ([place])    History reviewed. No pertinent surgical history. Family History: No family history on file. Family Psychiatric  History: No not really aware of any Social History:  History  Alcohol Use  . Yes    Comment: social drinker     History  Drug Use No    Social History   Social History  . Marital status: Legally Separated    Spouse name: N/A  . Number of children: N/A  . Years of education: N/A   Social History Main Topics  . Smoking status: Current Every Day Smoker    Packs/day: 2.00    Types: Cigarettes  . Smokeless tobacco: Never Used  . Alcohol use Yes     Comment: social drinker  . Drug use: No  . Sexual activity: Not Asked   Other Topics Concern  . None   Social History Narrative  . None   Additional Social History:    Allergies:   Allergies  Allergen Reactions  . Haldol [Haloperidol] Other (See Comments)    "lock up"  . Loxapine     "Locks ups"  . Penicillins Other (See Comments)    Neck swells Has patient had a PCN reaction causing immediate rash, facial/tongue/throat swelling, SOB or lightheadedness with hypotension: yes Has patient had a PCN reaction causing severe rash involving mucus membranes or skin necrosis: no Has patient had a PCN reaction that required hospitalization:  no Has patient had a PCN reaction occurring within the last 10 years: no If all of the above answers are "NO", then may proceed with Cephalosporin use.     Labs:  Results for orders placed or performed during the hospital encounter of 01/23/16 (from the past 48 hour(s))  Urine Drug Screen, Qualitative     Status: None   Collection Time: 01/23/16  5:11 PM  Result Value Ref Range   Tricyclic, Ur Screen NONE DETECTED NONE DETECTED   Amphetamines, Ur Screen NONE DETECTED  NONE DETECTED   MDMA (Ecstasy)Ur Screen NONE DETECTED NONE DETECTED   Cocaine Metabolite,Ur [place] NONE DETECTED NONE DETECTED   Opiate, Ur Screen NONE DETECTED NONE DETECTED   Phencyclidine (PCP) Ur S NONE DETECTED NONE DETECTED   Cannabinoid 50 Ng, Ur [place] NONE DETECTED NONE DETECTED   Barbiturates, Ur Screen NONE DETECTED NONE DETECTED   Benzodiazepine, Ur Scrn NONE DETECTED NONE DETECTED   Methadone Scn, Ur NONE DETECTED NONE DETECTED    Comment: (NOTE) 741  Tricyclics, urine               Cutoff 1000 ng/mL 200  Amphetamines, urine             Cutoff 1000 ng/mL 300  MDMA (Ecstasy), urine           Cutoff 500 ng/mL 400  Cocaine Metabolite, urine       Cutoff 300 ng/mL 500  Opiate, urine                   Cutoff 300 ng/mL 600  Phencyclidine (PCP), urine  Cutoff 25 ng/mL 700  Cannabinoid, urine              Cutoff 50 ng/mL 800  Barbiturates, urine             Cutoff 200 ng/mL 900  Benzodiazepine, urine           Cutoff 200 ng/mL 1000 Methadone, urine                Cutoff 300 ng/mL 1100 1200 The urine drug screen provides only a preliminary, unconfirmed 1300 analytical test result and should not be used for non-medical 1400 purposes. Clinical consideration and professional judgment should 1500 be applied to any positive drug screen result due to possible 1600 interfering substances. A more specific alternate chemical method 1700 must be used in order to obtain a confirmed analytical result.  1800 Gas chromato graphy / mass spectrometry (GC/MS) is the preferred 1900 confirmatory method.   Comprehensive metabolic panel     Status: Abnormal   Collection Time: 01/23/16  5:40 PM  Result Value Ref Range   Sodium 137 135 - 145 mmol/L   Potassium 3.9 3.5 - 5.1 mmol/L   Chloride 105 101 - 111 mmol/L   CO2 26 22 - 32 mmol/L   Glucose, Bld 104 (H) 65 - 99 mg/dL   BUN 13 6 - 20 mg/dL   Creatinine, Ser 0.91 0.61 - 1.24 mg/dL   Calcium 8.8 (L) 8.9 - 10.3 mg/dL   Total Protein 6.9 6.5 - 8.1  g/dL   Albumin 4.1 3.5 - 5.0 g/dL   AST 48 (H) 15 - 41 U/L   ALT 67 (H) 17 - 63 U/L   Alkaline Phosphatase 96 38 - 126 U/L   Total Bilirubin 0.8 0.3 - 1.2 mg/dL   GFR calc non Af Amer >60 >60 mL/min   GFR calc Af Amer >60 >60 mL/min    Comment: (NOTE) The eGFR has been calculated using the CKD EPI equation. This calculation has not been validated in all clinical situations. eGFR's persistently <60 mL/min signify possible Chronic Kidney Disease.    Anion gap 6 5 - 15  Ethanol     Status: None   Collection Time: 01/23/16  5:40 PM  Result Value Ref Range   Alcohol, Ethyl (B) <5 <5 mg/dL    Comment:        LOWEST DETECTABLE LIMIT FOR SERUM ALCOHOL IS 5 mg/dL FOR MEDICAL PURPOSES ONLY   Salicylate level     Status: None   Collection Time: 01/23/16  5:40 PM  Result Value Ref Range   Salicylate Lvl <7.0 2.8 - 30.0 mg/dL  Acetaminophen level     Status: Abnormal   Collection Time: 01/23/16  5:40 PM  Result Value Ref Range   Acetaminophen (Tylenol), Serum <10 (L) 10 - 30 ug/mL    Comment:        THERAPEUTIC CONCENTRATIONS VARY SIGNIFICANTLY. A RANGE OF 10-30 ug/mL MAY BE AN EFFECTIVE CONCENTRATION FOR MANY PATIENTS. HOWEVER, SOME ARE BEST TREATED AT CONCENTRATIONS OUTSIDE THIS RANGE. ACETAMINOPHEN CONCENTRATIONS >150 ug/mL AT 4 HOURS AFTER INGESTION AND >50 ug/mL AT 12 HOURS AFTER INGESTION ARE OFTEN ASSOCIATED WITH TOXIC REACTIONS.   cbc     Status: None   Collection Time: 01/23/16  5:40 PM  Result Value Ref Range   WBC 7.6 3.8 - 10.6 K/uL   RBC 4.73 4.40 - 5.90 MIL/uL   Hemoglobin 14.6 13.0 - 18.0 g/dL   HCT 42.8 40.0 - 52.0 %  MCV 90.5 80.0 - 100.0 fL   MCH 30.8 26.0 - 34.0 pg   MCHC 34.1 32.0 - 36.0 g/dL   RDW 14.4 11.5 - 14.5 %   Platelets 299 150 - 440 K/uL    Current Facility-Administered Medications  Medication Dose Route Frequency Provider Last Rate Last Dose  . OLANZapine (ZYPREXA) tablet 30 mg  30 mg Oral QHS Merlyn Lot, MD   30 mg at 01/23/16  2101   Current Outpatient Prescriptions  Medication Sig Dispense Refill  . OLANZapine (ZYPREXA) 10 MG tablet Take 3 tablets (30 mg total) by mouth at bedtime. 90 tablet 0  . OLANZapine (ZYPREXA) 15 MG tablet Take 2 tablets (30 mg total) by mouth at bedtime. 30 tablet 0  . OLANZapine (ZYPREXA) 15 MG tablet Take 2 tablets (30 mg total) by mouth at bedtime. 14 tablet 0    Musculoskeletal: Strength & Muscle Tone: within normal limits Gait & Station: normal Patient leans: N/A  Psychiatric Specialty Exam: Physical Exam  Nursing note and vitals reviewed. Constitutional: He appears well-developed and well-nourished.  HENT:  Head: Normocephalic and atraumatic.  Eyes: Conjunctivae are normal. Pupils are equal, round, and reactive to light.  Neck: Normal range of motion.  Cardiovascular: Regular rhythm and normal heart sounds.   Respiratory: Effort normal. No respiratory distress.  GI: Soft.  Musculoskeletal: Normal range of motion.  Neurological: He is alert.  Skin: Skin is warm and dry.  Psychiatric: His speech is normal and behavior is normal. His affect is blunt. Cognition and memory are normal. He expresses impulsivity. He expresses no homicidal and no suicidal ideation.    Review of Systems  Constitutional: Negative.   HENT: Negative.   Eyes: Negative.   Respiratory: Negative.   Cardiovascular: Negative.   Gastrointestinal: Negative.   Musculoskeletal: Negative.   Skin: Negative.   Neurological: Negative.   Psychiatric/Behavioral: Positive for hallucinations. Negative for depression, memory loss, substance abuse and suicidal ideas. The patient is not nervous/anxious and does not have insomnia.     Blood pressure 119/61, pulse 81, temperature 98.5 F (36.9 C), temperature source Oral, resp. rate 18, height 5' 11"  (1.803 m), weight 118.8 kg (262 lb), SpO2 99 %.Body mass index is 36.54 kg/m.  General Appearance: Casual  Eye Contact:  Fair  Speech:  Clear and Coherent  Volume:   Normal  Mood:  Dysphoric  Affect:  Congruent  Thought Process:  Goal Directed  Orientation:  Full (Time, Place, and Person)  Thought Content:  Logical  Suicidal Thoughts:  No  Homicidal Thoughts:  No  Memory:  Immediate;   Fair Recent;   Fair Remote;   Fair  Judgement:  Impaired  Insight:  Shallow  Psychomotor Activity:  Normal  Concentration:  Concentration: Fair  Recall:  [employer] of Knowledge:  Fair  Language:  Fair  Akathisia:  No  Handed:  Right  AIMS (if indicated):     Assets:  Desire for Improvement Physical Health Resilience  ADL's:  Intact  Cognition:  WNL  Sleep:        Treatment Plan Summary: Plan Patient will be given a 7 day supply of Zyprexa 30 mg at night. He will be given referral to local mental health providers and we can help him make some arrangements for transportation to the shelter and out of the hospital. Case reviewed with nursing and emergency room doctor. I will discontinue his involuntary commitment.  Disposition: Patient does not meet criteria for psychiatric inpatient admission. Supportive therapy provided  about ongoing stressors.  Alethia Berthold, MD 01/24/2016 12:38 PM

## 2016-01-24 NOTE — ED Notes (Signed)

## 2016-01-24 NOTE — ED Notes (Signed)
Diet ordered and call placed to dining services for regular tray. Patient awake and alert. Very irritable. States he is hearing voices but does not seem to be responding to internal stimuli. He is able to make eye contact with this writer, speech is clear and coherent, thoughts are organized and goal directed.  Patient is asking for Zyprexa 30 mg, but then says he does not take his medications because he can't afford them. Asking for a Child psychotherapistsocial worker. When questioned about what we can do for him this ED visit, he refused to talk.

## 2016-01-24 NOTE — BH Assessment (Signed)
Assessment Note  Kenneth [name] is an 50 y.o. male presenting to the ED stating that he wanted "long-term help". He was at [facility] emergency room this morning and was released because there was no evidence that he required hospitalization. Pt makes vague statements about suicidal or homicidal ideations as a means of manipulating his way into getting into the hospital. He admits that he never follows up with any of his prescription medicine or outpatient treatment. He was just at [facility] psychiatric Ward a couple days ago and was prescribed Zyprexa.   Pt was evaluated by psychiatry and did not meet criteria for inpatient hospitalization.  Diagnosis: Schizophrenia  Past Medical History:  Past Medical History:  Diagnosis Date  . Anxiety   . Schizophrenia ([facility])     History reviewed. No pertinent surgical history.  Family History: No family history on file.  Social History:  reports that he has been smoking Cigarettes.  He has been smoking about 2.00 packs per day. He has never used smokeless tobacco. He reports that he drinks alcohol. He reports that he does not use drugs.  Additional Social History:  Alcohol / Drug Use History of alcohol / drug use?: No history of alcohol / drug abuse  CIWA: CIWA-Ar BP: 123/74 Pulse Rate: 95 COWS:    Allergies:  Allergies  Allergen Reactions  . Haldol [Haloperidol] Other (See Comments)    "lock up"  . Loxapine     "Locks ups"  . Penicillins Other (See Comments)    Neck swells Has patient had a PCN reaction causing immediate rash, facial/tongue/throat swelling, SOB or lightheadedness with hypotension: yes Has patient had a PCN reaction causing severe rash involving mucus membranes or skin necrosis: no Has patient had a PCN reaction that required hospitalization:  no Has patient had a PCN reaction occurring within the last 10 years: no If all of the above answers are "NO", then may proceed with Cephalosporin use.     Home  Medications:  (Not in a hospital admission)  OB/GYN Status:  No LMP for male patient.  General Assessment Data Location of Assessment: [facility] ED TTS Assessment: In system Is this a Tele or Face-to-Face Assessment?: Face-to-Face Is this an Initial Assessment or a Re-assessment for this encounter?: Initial Assessment Marital status: Separated (since 89) Maiden name: n/a Is patient pregnant?: No Pregnancy Status: No Living Arrangements: Other (Comment) (homeless) Can pt return to current living arrangement?: No Admission Status: Voluntary Is patient capable of signing voluntary admission?: Yes Referral Source: Self/Family/Friend     Crisis Care Plan Living Arrangements: Other (Comment) (homeless) Legal Guardian: Other: (self) Name of Psychiatrist: None Name of Therapist: None  Education Status Is patient currently in school?: No Current Grade: n/a Highest grade of school patient has completed: HS Diploma Name of school: n/a Contact person: n/a  Risk to self with the past 6 months Suicidal Ideation: No Has patient been a risk to self within the past 6 months prior to admission? : No Suicidal Intent: No Has patient had any suicidal intent within the past 6 months prior to admission? : No Is patient at risk for suicide?: No Suicidal Plan?: No Has patient had any suicidal plan within the past 6 months prior to admission? : No Access to Means: No What has been your use of drugs/alcohol within the last 12 months?: Denies Previous Attempts/Gestures: Yes How many times?: 1 Other Self Harm Risks: None identified Triggers for Past Attempts: None known Intentional Self Injurious Behavior: None Family  Suicide History: No Recent stressful life event(s): Other (Comment) (homeless) Persecutory voices/beliefs?: No Depression: No Substance abuse history and/or treatment for substance abuse?: No Suicide prevention information given to non-admitted patients: Not applicable  Risk to  Others within the past 6 months Homicidal Ideation: Yes-Currently Present Does patient have any lifetime risk of violence toward others beyond the six months prior to admission? : No Thoughts of Harm to Others: Yes-Currently Present Comment - Thoughts of Harm to Others: Patient makes vague statements of HI towards others. Current Homicidal Intent: No Current Homicidal Plan: No Access to Homicidal Means: No Identified Victim: None identified History of harm to others?: No Assessment of Violence: None Noted Violent Behavior Description: None identified Does patient have access to weapons?: No Criminal Charges Pending?: No Does patient have a court date: No Is patient on probation?: No  Psychosis Hallucinations: Auditory Delusions: None noted  Mental Status Report Appearance/Hygiene: In scrubs Eye Contact: Good Motor Activity: Freedom of movement Speech: Logical/coherent Level of Consciousness: Alert Mood: Pleasant Affect: Appropriate to circumstance Anxiety Level: None Thought Processes: Coherent, Relevant Judgement: Unimpaired Orientation: Person, Place, Time, Situation, Appropriate for developmental age Obsessive Compulsive Thoughts/Behaviors: None  Cognitive Functioning Concentration: Decreased Memory: Recent Intact, Remote Intact IQ: Average Insight: Fair Impulse Control: Fair Appetite: Good Sleep: No Change Vegetative Symptoms: None  ADLScreening [facility] Assessment Services) Patient's cognitive ability adequate to safely complete daily activities?: Yes Patient able to express need for assistance with ADLs?: Yes Independently performs ADLs?: Yes (appropriate for developmental age)  Prior Inpatient Therapy Prior Inpatient Therapy: Yes Prior Therapy Dates: 01/2016 Prior Therapy Facilty/Provider(s): [facility] Reason for Treatment: Schizoohrenia   Prior Outpatient Therapy Prior Outpatient Therapy: No Prior Therapy Dates: N/A Prior Therapy Facilty/Provider(s):  N/A Reason for Treatment: N/A Does patient have an ACCT team?: No Does patient have Intensive In-House Services?  : No Does patient have [facility] services? : No Does patient have P4CC services?: No  ADL Screening (condition at time of admission) Patient's cognitive ability adequate to safely complete daily activities?: Yes Patient able to express need for assistance with ADLs?: Yes Independently performs ADLs?: Yes (appropriate for developmental age)       Abuse/Neglect Assessment (Assessment to be complete while patient is alone) Physical Abuse: Denies Verbal Abuse: Denies Sexual Abuse: Denies Exploitation of patient/patient's resources: Denies Self-Neglect: Denies Values / Beliefs Cultural Requests During Hospitalization: None Spiritual Requests During Hospitalization: None Consults Spiritual Care Consult Needed: No Social Work Consult Needed: No Merchant navy officerAdvance Directives (For Healthcare) Does patient have an advance directive?: No Would patient like information on creating an advanced directive?: No - patient declined information    Additional Information 1:1 In Past 12 Months?: No CIRT Risk: No Elopement Risk: No Does patient have medical clearance?: Yes     Disposition:  Disposition Initial Assessment Completed for this Encounter: Yes Disposition of Patient: Other dispositions (observe overnight) Other disposition(s): Other (Comment)  On Site Evaluation by:   Reviewed with Physician:    Artist Beachoxana C Candis Kabel 01/24/2016 12:22 AM

## 2016-01-26 ENCOUNTER — Emergency Department
Admission: EM | Admit: 2016-01-26 | Discharge: 2016-01-26 | Disposition: A | Discharge status: Other Acute Inpatient Hospital | Payer: Self-pay | Attending: Emergency Medicine | Admitting: Emergency Medicine

## 2016-01-26 ENCOUNTER — Encounter: Payer: Self-pay | Admitting: *Deleted

## 2016-01-26 ENCOUNTER — Inpatient Hospital Stay
Admission: RE | Admit: 2016-01-26 | Discharge: 2016-02-06 | DRG: 885 | Disposition: A | Discharge status: Home / Self Care | Payer: No Typology Code available for payment source | Source: Intra-hospital | Attending: Psychiatry | Admitting: Psychiatry

## 2016-01-26 DIAGNOSIS — Z9119 Patient's noncompliance with other medical treatment and regimen: Secondary | ICD-10-CM

## 2016-01-26 DIAGNOSIS — F82 Specific developmental disorder of motor function: Secondary | ICD-10-CM | POA: Diagnosis present

## 2016-01-26 DIAGNOSIS — F200 Paranoid schizophrenia: Secondary | ICD-10-CM | POA: Diagnosis not present

## 2016-01-26 DIAGNOSIS — R45850 Homicidal ideations: Secondary | ICD-10-CM | POA: Diagnosis present

## 2016-01-26 DIAGNOSIS — Z9889 Other specified postprocedural states: Secondary | ICD-10-CM

## 2016-01-26 DIAGNOSIS — R45851 Suicidal ideations: Secondary | ICD-10-CM | POA: Diagnosis present

## 2016-01-26 DIAGNOSIS — R7303 Prediabetes: Secondary | ICD-10-CM | POA: Diagnosis present

## 2016-01-26 DIAGNOSIS — Z915 Personal history of self-harm: Secondary | ICD-10-CM | POA: Diagnosis not present

## 2016-01-26 DIAGNOSIS — Z590 Homelessness: Secondary | ICD-10-CM | POA: Diagnosis not present

## 2016-01-26 DIAGNOSIS — F17200 Nicotine dependence, unspecified, uncomplicated: Secondary | ICD-10-CM | POA: Diagnosis present

## 2016-01-26 DIAGNOSIS — I10 Essential (primary) hypertension: Secondary | ICD-10-CM | POA: Diagnosis present

## 2016-01-26 DIAGNOSIS — Z880 Allergy status to penicillin: Secondary | ICD-10-CM | POA: Diagnosis not present

## 2016-01-26 DIAGNOSIS — R440 Auditory hallucinations: Secondary | ICD-10-CM | POA: Insufficient documentation

## 2016-01-26 DIAGNOSIS — Z765 Malingerer [conscious simulation]: Secondary | ICD-10-CM

## 2016-01-26 DIAGNOSIS — Z888 Allergy status to other drugs, medicaments and biological substances status: Secondary | ICD-10-CM

## 2016-01-26 DIAGNOSIS — F17210 Nicotine dependence, cigarettes, uncomplicated: Secondary | ICD-10-CM | POA: Diagnosis present

## 2016-01-26 DIAGNOSIS — Z79899 Other long term (current) drug therapy: Secondary | ICD-10-CM | POA: Insufficient documentation

## 2016-01-26 DIAGNOSIS — F602 Antisocial personality disorder: Secondary | ICD-10-CM | POA: Diagnosis present

## 2016-01-26 DIAGNOSIS — F419 Anxiety disorder, unspecified: Secondary | ICD-10-CM | POA: Insufficient documentation

## 2016-01-26 DIAGNOSIS — Z91199 Patient's noncompliance with other medical treatment and regimen due to unspecified reason: Secondary | ICD-10-CM

## 2016-01-26 DIAGNOSIS — R443 Hallucinations, unspecified: Secondary | ICD-10-CM

## 2016-01-26 HISTORY — DX: Essential (primary) hypertension: I10

## 2016-01-26 HISTORY — DX: Pure hypercholesterolemia, unspecified: E78.00

## 2016-01-26 LAB — COMPREHENSIVE METABOLIC PANEL
ALBUMIN: 4.3 g/dL (ref 3.5–5.0)
ALK PHOS: 97 U/L (ref 38–126)
ALT: 45 U/L (ref 17–63)
AST: 39 U/L (ref 15–41)
Anion gap: 8 (ref 5–15)
BILIRUBIN TOTAL: 1.2 mg/dL (ref 0.3–1.2)
BUN: 11 mg/dL (ref 6–20)
CALCIUM: 8.9 mg/dL (ref 8.9–10.3)
CO2: 24 mmol/L (ref 22–32)
CREATININE: 1 mg/dL (ref 0.61–1.24)
Chloride: 104 mmol/L (ref 101–111)
GFR calc Af Amer: >60 mL/min (ref >60)
GLUCOSE: 96 mg/dL (ref 65–99)
Potassium: 3.7 mmol/L (ref 3.5–5.1)
Sodium: 136 mmol/L (ref 135–145)
TOTAL PROTEIN: 7.3 g/dL (ref 6.5–8.1)

## 2016-01-26 LAB — ACETAMINOPHEN LEVEL: Acetaminophen (Tylenol), Serum: <10 ug/mL — ABNORMAL LOW (ref 10–30)

## 2016-01-26 LAB — CBC
HEMATOCRIT: 42.5 % (ref 40.0–52.0)
Hemoglobin: 14.9 g/dL (ref 13.0–18.0)
MCH: 31.3 pg (ref 26.0–34.0)
MCHC: 34.9 g/dL (ref 32.0–36.0)
MCV: 89.6 fL (ref 80.0–100.0)
Platelets: 324 10*3/uL (ref 150–440)
RBC: 4.75 MIL/uL (ref 4.40–5.90)
RDW: 14 % (ref 11.5–14.5)
WBC: 9.4 10*3/uL (ref 3.8–10.6)

## 2016-01-26 LAB — ETHANOL

## 2016-01-26 LAB — URINE DRUG SCREEN, QUALITATIVE (ARMC ONLY)
AMPHETAMINES, UR SCREEN: NOT DETECTED
BENZODIAZEPINE, UR SCRN: NOT DETECTED
Barbiturates, Ur Screen: NOT DETECTED
CANNABINOID 50 NG, UR ~~LOC~~: NOT DETECTED
Cocaine Metabolite,Ur ~~LOC~~: NOT DETECTED
MDMA (Ecstasy)Ur Screen: NOT DETECTED
Methadone Scn, Ur: NOT DETECTED
OPIATE, UR SCREEN: NOT DETECTED
PHENCYCLIDINE (PCP) UR S: NOT DETECTED
Tricyclic, Ur Screen: NOT DETECTED

## 2016-01-26 LAB — SALICYLATE LEVEL: Salicylate Lvl: <4 mg/dL (ref 2.8–30.0)

## 2016-01-26 MED ORDER — ACETAMINOPHEN 325 MG PO TABS: 650.0000 mg | ORAL_TABLET | Freq: Four times a day (QID) | ORAL | Status: DC | PRN

## 2016-01-26 MED ORDER — ALUM & MAG HYDROXIDE-SIMETH 200-200-20 MG/5ML PO SUSP: 30.0000 mL | ORAL | Status: DC | PRN

## 2016-01-26 MED ORDER — HALOPERIDOL 5 MG PO TABS: 5.0000 mg | ORAL_TABLET | Freq: Every day | ORAL | Status: DC

## 2016-01-26 MED ORDER — MAGNESIUM HYDROXIDE 400 MG/5ML PO SUSP: 30.0000 mL | Freq: Every day | ORAL | Status: DC | PRN

## 2016-01-26 NOTE — ED Provider Notes (Signed)
Time Seen: Approximately 0543  I have reviewed the triage notes  Chief Complaint: Hallucinations   History of Present Illness: Kenneth MasseBenjamin M [name] is a 50 y.o. male *who states some auditory hallucinations with some homicidal thoughts of hurting others. Patient states he is also having some suicidal thoughts. Patient's frequent visitor here to the emergency department just recently discharged for similar. He was discharged on Zyprexa and states that "" does not work. He denies any physical complaints.   Past Medical History:  Diagnosis Date  . Anxiety   . Hypercholesteremia   . Hypertension   . Schizophrenia [facility](HCC)     Patient Active Problem List   Diagnosis Date Noted  . Antisocial personality disorder 01/23/2016  . Malingering 01/23/2016  . Noncompliance 01/23/2016  . Schizophrenia, paranoid type (HCC) 06/06/2014    Past Surgical History:  Procedure Laterality Date  . CIRCUMCISION, NON-NEWBORN      Past Surgical History:  Procedure Laterality Date  . CIRCUMCISION, NON-NEWBORN      Current Outpatient Rx  . Order #: [id number] Class: Print  . Order #: [id number] Class: Normal  . Order #: [id number] Class: Print    Allergies:  Haldol [haloperidol]; Loxapine; and Penicillins  Family History: History reviewed. No pertinent family history.  Social History: Social History  Substance Use Topics  . Smoking status: Current Every Day Smoker    Packs/day: 2.00    Types: Cigarettes  . Smokeless tobacco: Never Used  . Alcohol use Yes     Comment: social drinker     Review of Systems:   10 point review of systems was performed and was otherwise negative:  Constitutional: No fever Eyes: No visual disturbances ENT: No sore throat, ear pain Cardiac: No chest pain Respiratory: No shortness of breath, wheezing, or stridor Abdomen: No abdominal pain, no vomiting, No diarrhea Endocrine: No weight loss, No night sweats Extremities: No peripheral edema, cyanosis Skin: No  rashes, easy bruising Neurologic: No focal weakness, trouble with speech or swollowing Urologic: No dysuria, Hematuria, or urinary frequency   Physical Exam:  ED Triage Vitals  Enc Vitals Group     BP 01/26/16 0424 (!) 140/100     Pulse Rate 01/26/16 0424 98     Resp 01/26/16 0424 20     Temp 01/26/16 0424 98.3 F (36.8 C)     Temp Source 01/26/16 0424 Oral     SpO2 01/26/16 0424 100 %     Weight 01/26/16 0425 262 lb (118.8 kg)     Height 01/26/16 0425 5\' 11"  (1.803 m)     Head Circumference --      Peak Flow --      Pain Score --      Pain Loc --      Pain Edu? --      Excl. in GC? --     General: Awake , Alert , and Oriented times 3; GCS 15 Head: Normal cephalic , atraumatic Eyes: Pupils equal , round, reactive to light Nose/Throat: No nasal drainage, patent upper airway without erythema or exudate.  Neck: Supple, Full range of motion, No anterior adenopathy or palpable thyroid masses Lungs: Clear to ascultation without wheezes , rhonchi, or rales Heart: Regular rate, regular rhythm without murmurs , gallops , or rubs Abdomen: Soft, non tender without rebound, guarding , or rigidity; bowel sounds positive and symmetric in all 4 quadrants. No organomegaly .        Extremities: 2 plus symmetric pulses. No edema, clubbing or cyanosis Neurologic: normal  ambulation, Motor symmetric without deficits, sensory intact Skin: warm, dry, no rashes   Labs:   All laboratory work was reviewed including any pertinent negatives or positives listed below:  Labs Reviewed  ACETAMINOPHEN LEVEL - Abnormal; Notable for the following:       Result Value   Acetaminophen (Tylenol), Serum <10 (*)    All other components within normal limits  COMPREHENSIVE METABOLIC PANEL  ETHANOL  SALICYLATE LEVEL  CBC  URINE DRUG SCREEN, QUALITATIVE ([facility] ONLY)   ED Course: I felt I should IVC the patient secondary to his multiple complaints of concern from a psychiatric standpoint. Given his history  though there does not appear to be any new findings. Patient otherwise has no physical complaints  Clinical Course     Assessment: Paranoid schizophrenia Antisocial personality disorder Malingering     Plan: * Patient received another psychiatric evaluation. He is otherwise hemodynamically stable at this time medically cleared           Jennye Moccasin, MD 01/26/16 [phone]

## 2016-01-26 NOTE — Consult Note (Signed)
[facility] Consult   Reason for Consult:  Consult for 50 year old man with history of schizophrenia and antisocial personality returns voicing suicidal thoughts Referring Physician:  Paduchowski Patient Identification: Kenneth [name] MRN:  [id number] Principal Diagnosis: Schizophrenia, paranoid type [facility]) Diagnosis:   Patient Active Problem List   Diagnosis Date Noted  . Antisocial personality disorder [F60.2] 01/23/2016  . Malingering [Z76.5] 01/23/2016  . Noncompliance [Z91.19] 01/23/2016  . Schizophrenia, paranoid type ([place]) [F20.0] 06/06/2014    Total Time spent with patient: 1 hour  Subjective:   Kenneth [name] is a 50 y.o. male patient admitted with "just the same voices and suicide".  HPI:  Patient interviewed. Chart reviewed. 50 year old man with history of schizophrenia who was here in the emergency room just a couple days ago. He returns stating that he continues to have auditory hallucinations. These as usual are somewhat vaguely described. He claims they tell him things and sometimes urge him to start fights with other people. He says that his mood has been irritable and depressed. He hasn't been able to sleep well. He admits that he didn't take any of the medicine that he was given after leaving the hospital even though we went to the effort of giving him a 7 day supply of Zyprexa. He won't tell me where he's been the last couple days but implies that he is pretty much been staying outdoors. He did not go to the shelter. He continues to talk about how he is going to kill himself or kill somebody else all rather vaguely. Doesn't have any new physical complaints.  Social history: He has family in [place] but they want have anything to do with him. He spent most of his life in jail or in mental hospitals. Very little social skill. Very little support.  Medical history: History of dyslipidemia otherwise no significant medical issues.  Substance  abuse history: Denies any recent drug or alcohol abuse and actually doesn't seem to have a significant long-term problem with any substance use issues. Drug and alcohol screens are negative.  Past Psychiatric History: Long-standing diagnosis of schizophrenia. Complains of hallucinations. A little bit unclear to me whether he might really have psychosis or if he uses this as an excuse for bad behavior. Nevertheless he does seem to have a little bit of cognitive slowing and some of the presentation typical of schizophrenia. I expect he does have paranoid schizophrenia but also has a lot of personality and behavior problems. Has a history of suicide attempts or at least threats and has a history of violence.  Risk to Self: Suicidal Ideation: Yes-Currently Present Suicidal Intent: No Is patient at risk for suicide?: No Suicidal Plan?: No Access to Means: No What has been your use of drugs/alcohol within the last 12 months?: denies How many times?: 1 Other Self Harm Risks: Denies Triggers for Past Attempts: None known Intentional Self Injurious Behavior: None Risk to Others: Homicidal Ideation: Yes-Currently Present Thoughts of Harm to Others: Yes-Currently Present Comment - Thoughts of Harm to Others: Patient makes vague statements of HI towards others Current Homicidal Intent: No Current Homicidal Plan: No Access to Homicidal Means: No Identified Victim: None identified History of harm to others?: No Assessment of Violence: None Noted Violent Behavior Description: None identified Does patient have access to weapons?: No Criminal Charges Pending?: No Does patient have a court date: No Prior Inpatient Therapy: Prior Inpatient Therapy: Yes Prior Therapy Dates: 01/2016 Prior Therapy Facilty/Provider(s): [facility] Reason for Treatment: Schizoohrenia  Prior  Outpatient Therapy: Prior Outpatient Therapy: No Prior Therapy Dates: N/A Prior Therapy Facilty/Provider(s): N/A Reason for Treatment:  N/A Does patient have an ACCT team?: No Does patient have Intensive In-House Services?  : No Does patient have [facility] services? : No Does patient have P4CC services?: No  Past Medical History:  Past Medical History:  Diagnosis Date  . Anxiety   . Hypercholesteremia   . Hypertension   . Schizophrenia [facility])     Past Surgical History:  Procedure Laterality Date  . CIRCUMCISION, NON-NEWBORN     Family History: History reviewed. No pertinent family history. Family Psychiatric  History: Patient denies being aware of any family history of mental illness Social History:  History  Alcohol Use  . Yes    Comment: social drinker     History  Drug Use No    Social History   Social History  . Marital status: Legally Separated    Spouse name: N/A  . Number of children: N/A  . Years of education: N/A   Social History Main Topics  . Smoking status: Current Every Day Smoker    Packs/day: 2.00    Types: Cigarettes  . Smokeless tobacco: Never Used  . Alcohol use Yes     Comment: social drinker  . Drug use: No  . Sexual activity: Not Asked   Other Topics Concern  . None   Social History Narrative  . None   Additional Social History:    Allergies:   Allergies  Allergen Reactions  . Haldol [Haloperidol] Other (See Comments)    "lock up"  . Loxapine     "Locks ups"  . Penicillins Other (See Comments)    Neck swells Has patient had a PCN reaction causing immediate rash, facial/tongue/throat swelling, SOB or lightheadedness with hypotension: yes Has patient had a PCN reaction causing severe rash involving mucus membranes or skin necrosis: no Has patient had a PCN reaction that required hospitalization:  no Has patient had a PCN reaction occurring within the last 10 years: no If all of the above answers are "NO", then may proceed with Cephalosporin use.     Labs:  Results for orders placed or performed during the hospital encounter of 01/26/16 (from the past 48  hour(s))  Comprehensive metabolic panel     Status: None   Collection Time: 01/26/16  4:43 AM  Result Value Ref Range   Sodium 136 135 - 145 mmol/L   Potassium 3.7 3.5 - 5.1 mmol/L   Chloride 104 101 - 111 mmol/L   CO2 24 22 - 32 mmol/L   Glucose, Bld 96 65 - 99 mg/dL   BUN 11 6 - 20 mg/dL   Creatinine, Ser 1.00 0.61 - 1.24 mg/dL   Calcium 8.9 8.9 - 10.3 mg/dL   Total Protein 7.3 6.5 - 8.1 g/dL   Albumin 4.3 3.5 - 5.0 g/dL   AST 39 15 - 41 U/L   ALT 45 17 - 63 U/L   Alkaline Phosphatase 97 38 - 126 U/L   Total Bilirubin 1.2 0.3 - 1.2 mg/dL   GFR calc non Af Amer >60 >60 mL/min   GFR calc Af Amer >60 >60 mL/min    Comment: (NOTE) The eGFR has been calculated using the CKD EPI equation. This calculation has not been validated in all clinical situations. eGFR's persistently <60 mL/min signify possible Chronic Kidney Disease.    Anion gap 8 5 - 15  Ethanol     Status: None   Collection Time:  01/26/16  4:43 AM  Result Value Ref Range   Alcohol, Ethyl (B) <5 <5 mg/dL    Comment:        LOWEST DETECTABLE LIMIT FOR SERUM ALCOHOL IS 5 mg/dL FOR MEDICAL PURPOSES ONLY   Salicylate level     Status: None   Collection Time: 01/26/16  4:43 AM  Result Value Ref Range   Salicylate Lvl <4.3 2.8 - 30.0 mg/dL  Acetaminophen level     Status: Abnormal   Collection Time: 01/26/16  4:43 AM  Result Value Ref Range   Acetaminophen (Tylenol), Serum <10 (L) 10 - 30 ug/mL    Comment:        THERAPEUTIC CONCENTRATIONS VARY SIGNIFICANTLY. A RANGE OF 10-30 ug/mL MAY BE AN EFFECTIVE CONCENTRATION FOR MANY PATIENTS. HOWEVER, SOME ARE BEST TREATED AT CONCENTRATIONS OUTSIDE THIS RANGE. ACETAMINOPHEN CONCENTRATIONS >150 ug/mL AT 4 HOURS AFTER INGESTION AND >50 ug/mL AT 12 HOURS AFTER INGESTION ARE OFTEN ASSOCIATED WITH TOXIC REACTIONS.   cbc     Status: None   Collection Time: 01/26/16  4:43 AM  Result Value Ref Range   WBC 9.4 3.8 - 10.6 K/uL   RBC 4.75 4.40 - 5.90 MIL/uL   Hemoglobin  14.9 13.0 - 18.0 g/dL   HCT 42.5 40.0 - 52.0 %   MCV 89.6 80.0 - 100.0 fL   MCH 31.3 26.0 - 34.0 pg   MCHC 34.9 32.0 - 36.0 g/dL   RDW 14.0 11.5 - 14.5 %   Platelets 324 150 - 440 K/uL  Urine Drug Screen, Qualitative     Status: None   Collection Time: 01/26/16  4:50 AM  Result Value Ref Range   Tricyclic, Ur Screen NONE DETECTED NONE DETECTED   Amphetamines, Ur Screen NONE DETECTED NONE DETECTED   MDMA (Ecstasy)Ur Screen NONE DETECTED NONE DETECTED   Cocaine Metabolite,Ur [place] NONE DETECTED NONE DETECTED   Opiate, Ur Screen NONE DETECTED NONE DETECTED   Phencyclidine (PCP) Ur S NONE DETECTED NONE DETECTED   Cannabinoid 50 Ng, Ur  NONE DETECTED NONE DETECTED   Barbiturates, Ur Screen NONE DETECTED NONE DETECTED   Benzodiazepine, Ur Scrn NONE DETECTED NONE DETECTED   Methadone Scn, Ur NONE DETECTED NONE DETECTED    Comment: (NOTE) 329  Tricyclics, urine               Cutoff 1000 ng/mL 200  Amphetamines, urine             Cutoff 1000 ng/mL 300  MDMA (Ecstasy), urine           Cutoff 500 ng/mL 400  Cocaine Metabolite, urine       Cutoff 300 ng/mL 500  Opiate, urine                   Cutoff 300 ng/mL 600  Phencyclidine (PCP), urine      Cutoff 25 ng/mL 700  Cannabinoid, urine              Cutoff 50 ng/mL 800  Barbiturates, urine             Cutoff 200 ng/mL 900  Benzodiazepine, urine           Cutoff 200 ng/mL 1000 Methadone, urine                Cutoff 300 ng/mL 1100 1200 The urine drug screen provides only a preliminary, unconfirmed 1300 analytical test result and should not be used for non-medical 1400 purposes. Clinical consideration and professional judgment  should 1500 be applied to any positive drug screen result due to possible 1600 interfering substances. A more specific alternate chemical method 1700 must be used in order to obtain a confirmed analytical result.  1800 Gas chromato graphy / mass spectrometry (GC/MS) is the preferred 1900 confirmatory method.      Current Facility-Administered Medications  Medication Dose Route Frequency Provider Last Rate Last Dose  . haloperidol (HALDOL) tablet 5 mg  5 mg Oral QHS Gonzella Lex, MD       Current Outpatient Prescriptions  Medication Sig Dispense Refill  . OLANZapine (ZYPREXA) 10 MG tablet Take 3 tablets (30 mg total) by mouth at bedtime. 90 tablet 0  . OLANZapine (ZYPREXA) 15 MG tablet Take 2 tablets (30 mg total) by mouth at bedtime. 30 tablet 0  . OLANZapine (ZYPREXA) 15 MG tablet Take 2 tablets (30 mg total) by mouth at bedtime. 14 tablet 0    Musculoskeletal: Strength & Muscle Tone: within normal limits Gait & Station: normal Patient leans: N/A  Psychiatric Specialty Exam: Physical Exam  Nursing note and vitals reviewed. Constitutional: He appears well-developed and well-nourished.  HENT:  Head: Normocephalic and atraumatic.  Eyes: Conjunctivae are normal. Pupils are equal, round, and reactive to light.  Neck: Normal range of motion.  Cardiovascular: Regular rhythm and normal heart sounds.   Respiratory: Effort normal. No respiratory distress.  GI: Soft.  Musculoskeletal: Normal range of motion.  Neurological: He is alert.  Skin: Skin is warm and dry.     Psychiatric: His speech is normal. His affect is blunt. He is slowed. He expresses impulsivity. He expresses homicidal and suicidal ideation. He exhibits abnormal recent memory.    Review of Systems  Constitutional: Negative.   HENT: Negative.   Eyes: Negative.   Respiratory: Negative.   Cardiovascular: Negative.   Gastrointestinal: Negative.   Musculoskeletal: Negative.   Skin: Negative.   Neurological: Negative.   Psychiatric/Behavioral: Positive for depression, hallucinations and suicidal ideas. Negative for memory loss and substance abuse. The patient is nervous/anxious and has insomnia.     Blood pressure 109/62, pulse 92, temperature 97.8 F (36.6 C), temperature source Oral, resp. rate 20, height 5' 11"  (1.803  m), weight 118.8 kg (262 lb), SpO2 97 %.Body mass index is 36.54 kg/m.  General Appearance: Disheveled  Eye Contact:  Fair  Speech:  Slow  Volume:  Decreased  Mood:  Dysphoric  Affect:  Constricted  Thought Process:  Goal Directed  Orientation:  Full (Time, Place, and Person)  Thought Content:  Hallucinations: Auditory and Tangential  Suicidal Thoughts:  Yes.  without intent/plan  Homicidal Thoughts:  Yes.  without intent/plan  Memory:  Immediate;   Good Recent;   Fair Remote;   Fair  Judgement:  Impaired  Insight:  Shallow  Psychomotor Activity:  Decreased  Concentration:  Concentration: Fair  Recall:  [place] of Knowledge:  Good  Language:  Fair  Akathisia:  No  Handed:  Right  AIMS (if indicated):     Assets:  Communication Skills Physical Health Resilience  ADL's:  Intact  Cognition:  Impaired,  Mild  Sleep:        Treatment Plan Summary: Daily contact with patient to assess and evaluate symptoms and progress in treatment, Medication management and Plan This is the second time he presents here just a couple days and at that time he had been at another hospital the very morning. Patient timeshares going to be relentless about presenting here until we admitted him  to the hospital. I've explained to him repeatedly that even if we do that it is not likely to make any long-term improvement for him. I will go ahead however and admit him as the alternative doesn't seem to be productive. If there is a possibility that we can perhaps give him an injectable medicine or find some other placement for him it could be useful to stop the cycle. I'm going to start him on Haldol as he constantly complains about how he can't afford to buy any of his medicine. Other orders completed full lab tests to be evaluated.  Disposition: Recommend psychiatric Inpatient admission when medically cleared. Supportive therapy provided about ongoing stressors.  Alethia Berthold, MD 01/26/2016 1:41 PM

## 2016-01-26 NOTE — ED Notes (Signed)
BEHAVIORAL HEALTH ROUNDING Patient sleeping: Yes.   Patient alert and oriented: sleeping Behavior appropriate: Yes.  ; If no, describe:  Nutrition and fluids offered: Yes  Toileting and hygiene offered: Yes  Sitter present: q 25 min checks Law enforcement present: Yes

## 2016-01-26 NOTE — Progress Notes (Signed)
Admission Note:  Patient arrived to the unit at approximately 1550 pm.  Patient appeared disheveled and dressed in scrubs, body odor present and gait was steady.  Patient speech was clear and able to be understood.  Patient affect was preoccupied as patient was responding to voices that he stated," are telling me to attack people and blow them up".  Patient denies SI but endorses auditory and visual hallucinations and homicidal ideations at this time.  Patient was cooperative with assessment and answered questions appropriately but pausing frequently to respond to the voices that he stated ,"are pressing me to blow up some people".  Patient also stated that he was "in the process of blowing someone up right now".  Patient would not go into further detail when asked.  Patient also stated that "I see the people 5 feet away giving me instructions on how to blow things up".  Patient skin assessment was completed with another staff member present and patient was found to have multiple tattoos and blisters on bottom of both feet that he said," came from walking around to handle business".  Patient belongings were searched and logged by staff.  Patient was oriented to his room and the unit and patient is currently calm but continues to respond to the voices that he hears.

## 2016-01-26 NOTE — ED Triage Notes (Signed)
Pt reports having auditory hallucinations that are recurrent in nature. He states the voices tell him to "try to fight people". Pt states he wants to harm himself, denies plan at this time. Pt states he told people outside the ED entrance that he'd "kick their asses" because he thought they were "talking crap about" him. Pt has been on the premises asking for food. Pt states he is homeless and the shelter will not take him. Pt also states the zyprexa does not work and he cannot afford it.

## 2016-01-26 NOTE — Tx Team (Signed)
Initial Treatment Plan 01/26/2016 4:28 PM Kenneth MasseBenjamin M [name] ZOX:[id number]    PATIENT STRESSORS: Financial difficulties Medication change or noncompliance Occupational concerns   PATIENT STRENGTHS: Active sense of humor Communication skills   PATIENT IDENTIFIED PROBLEMS: Schizophrenia, paranoid type  Antisocial personality disorder                   DISCHARGE CRITERIA:  Ability to meet basic life and health needs Adequate post-discharge living arrangements Improved stabilization in mood, thinking, and/or behavior Motivation to continue treatment in a less acute level of care Verbal commitment to aftercare and medication compliance  PRELIMINARY DISCHARGE PLAN: Outpatient therapy  PATIENT/FAMILY INVOLVEMENT: This treatment plan has been presented to and reviewed with the patient, Kenneth MasseBenjamin M [name], and/or family member.  The patient and family have been given the opportunity to ask questions and make suggestions.  Santo HeldNakisha D Leston Schueller, RN 01/26/2016, 4:28 PM

## 2016-01-26 NOTE — BH Assessment (Signed)
Assessment Note  Kenneth [name] is an 50 y.o. male presenting to the ED with concerns with auditory hallucinations that are recurrent in nature. He reports that the voices are telling  him to "try to fight people". Pt states he wants to harm himself, denies plan at this time. Pt states he told people outside the ED entrance that he'd "kick their asses" because he thought they were "talking crap about" him. Pt is homeless and states that the shelter will not take him. Pt also states the zyprexa does not work and he cannot afford it.  Diagnosis: Anxiety  Past Medical History:  Past Medical History:  Diagnosis Date  . Anxiety   . Hypercholesteremia   . Hypertension   . Schizophrenia [facility])     Past Surgical History:  Procedure Laterality Date  . CIRCUMCISION, NON-NEWBORN      Family History: History reviewed. No pertinent family history.  Social History:  reports that he has been smoking Cigarettes.  He has been smoking about 2.00 packs per day. He has never used smokeless tobacco. He reports that he drinks alcohol. He reports that he does not use drugs.  Additional Social History:  Alcohol / Drug Use History of alcohol / drug use?: No history of alcohol / drug abuse  CIWA: CIWA-Ar BP: 109/62 Pulse Rate: 92 COWS:    Allergies:  Allergies  Allergen Reactions  . Haldol [Haloperidol] Other (See Comments)    "lock up"  . Loxapine     "Locks ups"  . Penicillins Other (See Comments)    Neck swells Has patient had a PCN reaction causing immediate rash, facial/tongue/throat swelling, SOB or lightheadedness with hypotension: yes Has patient had a PCN reaction causing severe rash involving mucus membranes or skin necrosis: no Has patient had a PCN reaction that required hospitalization:  no Has patient had a PCN reaction occurring within the last 10 years: no If all of the above answers are "NO", then may proceed with Cephalosporin use.     Home Medications:  (Not in a  hospital admission)  OB/GYN Status:  No LMP for male patient.  General Assessment Data Location of Assessment: [facility] ED TTS Assessment: In system Is this a Tele or Face-to-Face Assessment?: Face-to-Face Is this an Initial Assessment or a Re-assessment for this encounter?: Initial Assessment Marital status: Separated (since 83) Maiden name: n/a Is patient pregnant?: No Pregnancy Status: No Living Arrangements: Other (Comment) (homeless) Can pt return to current living arrangement?: Yes Admission Status: Voluntary Is patient capable of signing voluntary admission?: Yes Referral Source: Self/Family/Friend     Crisis Care Plan Living Arrangements: Other (Comment) (homeless) Legal Guardian: Other: (self) Name of Psychiatrist: None Name of Therapist: None  Education Status Highest grade of school patient has completed: HS Diploma Name of school: n/a Contact person: n/a  Risk to self with the past 6 months Suicidal Ideation: Yes-Currently Present Has patient been a risk to self within the past 6 months prior to admission? : No Suicidal Intent: No Has patient had any suicidal intent within the past 6 months prior to admission? : No Is patient at risk for suicide?: No Suicidal Plan?: No Has patient had any suicidal plan within the past 6 months prior to admission? : No Access to Means: No What has been your use of drugs/alcohol within the last 12 months?: denies Previous Attempts/Gestures: Yes How many times?: 1 Other Self Harm Risks: Denies Triggers for Past Attempts: None known Intentional Self Injurious Behavior: None Family Suicide History:  No Recent stressful life event(s): Other (Comment) (homelessness) Persecutory voices/beliefs?: No Depression: No Substance abuse history and/or treatment for substance abuse?: No Suicide prevention information given to non-admitted patients: Not applicable  Risk to Others within the past 6 months Homicidal Ideation: Yes-Currently  Present Does patient have any lifetime risk of violence toward others beyond the six months prior to admission? : No Thoughts of Harm to Others: Yes-Currently Present Comment - Thoughts of Harm to Others: Patient makes vague statements of HI towards others Current Homicidal Intent: No Current Homicidal Plan: No Access to Homicidal Means: No Identified Victim: None identified History of harm to others?: No Assessment of Violence: None Noted Violent Behavior Description: None identified Does patient have access to weapons?: No Criminal Charges Pending?: No Does patient have a court date: No Is patient on probation?: No  Psychosis Hallucinations: Auditory Delusions: None noted  Mental Status Report Appearance/Hygiene: In scrubs Eye Contact: Good Motor Activity: Freedom of movement Speech: Logical/coherent Level of Consciousness: Alert Mood: Sullen Affect: Appropriate to circumstance Anxiety Level: None Thought Processes: Relevant Judgement: Partial Orientation: Person, Place, Time, Situation, Appropriate for developmental age Obsessive Compulsive Thoughts/Behaviors: None  Cognitive Functioning Concentration: Good Memory: Recent Intact, Remote Intact IQ: Average Insight: Fair Impulse Control: Fair Appetite: Good Sleep: No Change Vegetative Symptoms: None  ADLScreening [facility] Assessment Services) Patient's cognitive ability adequate to safely complete daily activities?: Yes Patient able to express need for assistance with ADLs?: Yes Independently performs ADLs?: Yes (appropriate for developmental age)  Prior Inpatient Therapy Prior Inpatient Therapy: Yes Prior Therapy Dates: 01/2016 Prior Therapy Facilty/Provider(s): [facility] Reason for Treatment: Schizoohrenia   Prior Outpatient Therapy Prior Outpatient Therapy: No Prior Therapy Dates: N/A Prior Therapy Facilty/Provider(s): N/A Reason for Treatment: N/A Does patient have an ACCT team?: No Does patient have Intensive  In-House Services?  : No Does patient have [facility] services? : No Does patient have P4CC services?: No  ADL Screening (condition at time of admission) Patient's cognitive ability adequate to safely complete daily activities?: Yes Patient able to express need for assistance with ADLs?: Yes Independently performs ADLs?: Yes (appropriate for developmental age)       Abuse/Neglect Assessment (Assessment to be complete while patient is alone) Physical Abuse: Denies Verbal Abuse: Denies Sexual Abuse: Denies Exploitation of patient/patient's resources: Denies Self-Neglect: Denies Values / Beliefs Cultural Requests During Hospitalization: None Spiritual Requests During Hospitalization: None Consults Spiritual Care Consult Needed: No Social Work Consult Needed: No Merchant navy officerAdvance Directives (For Healthcare) Does patient have an advance directive?: No Would patient like information on creating an advanced directive?: Yes English as a second language teacher- Educational materials given    Additional Information 1:1 In Past 12 Months?: No CIRT Risk: No Elopement Risk: No Does patient have medical clearance?: Yes     Disposition:  Disposition Initial Assessment Completed for this Encounter: Yes Disposition of Patient: Other dispositions Other disposition(s): Other (Comment) (Pending Psych MD consult)  On Site Evaluation by:   Reviewed with Physician:    Artist Beachoxana C Masud Holub 01/26/2016 6:52 AM

## 2016-01-26 NOTE — ED Provider Notes (Signed)
-----------------------------------------   12:42 PM on 01/26/2016 -----------------------------------------  The patient has been seen and evaluated by psychiatry. Patient will be admitted to psychiatry once a bed is available.   Minna AntisKevin Karry Barrilleaux, MD 01/26/16 1242

## 2016-01-26 NOTE — BH Assessment (Signed)
[facility] Toni Amendlapacs.  Attending Physician will be Dr. Jennet MaduroPucilowska.   [facility] ValricoPhyllis.   Intake Paper Work has been signed and placed on patient chart.  ER staff is aware of the admission Christen Bame(Ronnie, ER Sect.; Dr. Lenard LancePaduchowski, ER MD; Tobi BastosAnna, Patient's Nurse & Angelique Blonderenise, Patient Access).

## 2016-01-27 DIAGNOSIS — F200 Paranoid schizophrenia: Principal | ICD-10-CM

## 2016-01-27 LAB — TSH: TSH: 1.686 u[IU]/mL (ref 0.350–4.500)

## 2016-01-27 LAB — LIPID PANEL
Cholesterol: 199 mg/dL (ref 0–200)
HDL: 36 mg/dL — ABNORMAL LOW (ref >40)
LDL CALC: 139 mg/dL — AB (ref 0–99)
TRIGLYCERIDES: 118 mg/dL (ref <150)
Total CHOL/HDL Ratio: 5.5 RATIO
VLDL: 24 mg/dL (ref 0–40)

## 2016-01-27 MED ORDER — BENZTROPINE MESYLATE 1 MG PO TABS
0.5000 mg | ORAL_TABLET | Freq: Two times a day (BID) | ORAL | Status: DC
  Administered 2016-01-27: 0.5 mg via ORAL
  Filled 2016-01-27: qty 1

## 2016-01-27 MED ORDER — HALOPERIDOL 5 MG PO TABS: 5.0000 mg | ORAL_TABLET | Freq: Two times a day (BID) | ORAL | Status: DC

## 2016-01-27 NOTE — [facility] Group Notes (Signed)
[facility] LCSW Group Therapy  01/27/2016 1:59 PM  Type of Therapy:  Group Therapy  Participation Level:  Patient did not attend group. CSW invited patient to group.   Summary of Progress/Problems: Self esteem: Patients discussed self esteem and how it impacts them. They discussed what aspects in their lives has influenced their self esteem. They were challenged to identify changes that are needed in order to improve self esteem. Patients participated in activity where they had to identify positive adjectives they felt described their personality. Patients shared with the group on the following areas: Things I am good at, What I like about my appearance, I've helped others by, What I value the most, compliments I have received, challenges I have overcome, thing that make me unique, and Times I've made others happy.    Makylee Sanborn G. Garnette CzechSampson MSW, LCSWA 01/27/2016, 2:00 PM

## 2016-01-27 NOTE — H&P (Signed)
Psychiatric Admission Assessment Adult  Patient Identification: Kenneth [name] MRN:  [id number] Date of Evaluation:  01/27/2016 Chief Complaint:  Schizophrenic Principal Diagnosis: Schizophrenia, paranoid (HCC) Diagnosis:   Patient Active Problem List   Diagnosis Date Noted  . Schizophrenia, paranoid (HCC) [F20.0] 01/26/2016  . Antisocial personality disorder [F60.2] 01/23/2016  . Malingering [Z76.5] 01/23/2016  . Noncompliance [Z91.19] 01/23/2016  . Schizophrenia, paranoid type (HCC) [F20.0] 06/06/2014   History of Present Illness: 50 year old man who has presented to the emergency room several times recently area he tells me today that he feels the same as he did yesterday. When pressed to explain what he means by that he will say "you know, suicidal homicidal." Asked to describe his hallucinations he is rather vague about it saying only that they are mean. Asked to describe suicidal ideation he tells me that he won't discuss any plan. Give any rationale for wanting to harm himself. He says he also has homicidal ideation. He now is talking about how he believes he has the power to blow people up with his mind and that he is doing it all the time. His behavior has been calm. Not bizarre. Takes care of his grooming and ADLs fine. Interacts with people on a basic level fine. Patient has been noncompliant with antipsychotic medicine and treatment. Long history of poor functioning noncompliance and antisocial behavior. Associated Signs/Symptoms: Depression Symptoms:  depressed mood, anhedonia, psychomotor retardation, suicidal thoughts without plan, (Hypo) Manic Symptoms:  Distractibility, Anxiety Symptoms:  Social Anxiety, Psychotic Symptoms:  Delusions, Hallucinations: Auditory PTSD Symptoms: Negative Total Time spent with patient: 1 hour  Past Psychiatric History: Patient has a long history of mental health problems. Multiple hospitalizations. History of antisocial behavior. He's  been on multiple medications while in hospitals but never follows up with outpatient treatment. He expresses a belief that he has hallucinations and delusions but his behavior truly does not seem very congruent with that. Patient stated goal has long been to simply be locked up in a mental hospital where he can spend the rest of his life. He does have a history of self-harm and suicidal behavior but not very frequently. He has a history of violence pretty much always in the service of terminal activity. Doesn't sound like he is ever really stayed consistently on medication for long.  Is the patient at risk to self? Yes.    Has the patient been a risk to self in the past 6 months? Yes.    Has the patient been a risk to self within the distant past? Yes.    Is the patient a risk to others? Yes.    Has the patient been a risk to others in the past 6 months? Yes.    Has the patient been a risk to others within the distant past? Yes.     Prior Inpatient Therapy:   Prior Outpatient Therapy:    Alcohol Screening: 1. How often do you have a drink containing alcohol?: Never 9. Have you or someone else been injured as a result of your drinking?: No 10. Has a relative or friend or a doctor or another health worker been concerned about your drinking or suggested you cut down?: No Alcohol Use Disorder Identification Test Final Score (AUDIT): 0 Brief Intervention: AUDIT score less than 7 or less-screening does not suggest unhealthy drinking-brief intervention not indicated Substance Abuse History in the last 12 months:  No. Consequences of Substance Abuse: Negative Previous Psychotropic Medications: Yes  Psychological Evaluations: Yes  Past  Medical History:  Past Medical History:  Diagnosis Date  . Anxiety   . Hypercholesteremia   . Hypertension   . Schizophrenia [facility])     Past Surgical History:  Procedure Laterality Date  . CIRCUMCISION, NON-NEWBORN     Family History: History reviewed. No  pertinent family history. Family Psychiatric  History: Patient reports a belief that there is family history of psychotic disease and other members of his family Tobacco Screening: Have you used any form of tobacco in the last 30 days? (Cigarettes, Smokeless Tobacco, Cigars, and/or Pipes): Yes Tobacco use, Select all that apply: 5 or more cigarettes per day Are you interested in Tobacco Cessation Medications?: No, patient refused Counseled patient on smoking cessation including recognizing danger situations, developing coping skills and basic information about quitting provided: Refused/Declined practical counseling Social History:  History  Alcohol Use  . Yes    Comment: social drinker     History  Drug Use No    Additional Social History: Marital status: Separated Separated, when?: Pt did not want to answer. What types of issues is patient dealing with in the relationship?: unknown Additional relationship information: n/a Are you sexually active?: No What is your sexual orientation?: heterosexual Has your sexual activity been affected by drugs, alcohol, medication, or emotional stress?: n/a Does patient have children?: Yes How many children?: 13 How is patient's relationship with their children?: Pt states he doesn't have a relationship with any of his children. Pt states he has more than 13 children but he does not know them or where they are.                         Allergies:   Allergies  Allergen Reactions  . Haldol [Haloperidol] Other (See Comments)    "lock up"  . Loxapine     "Locks ups"  . Penicillins Other (See Comments)    Neck swells Has patient had a PCN reaction causing immediate rash, facial/tongue/throat swelling, SOB or lightheadedness with hypotension: yes Has patient had a PCN reaction causing severe rash involving mucus membranes or skin necrosis: no Has patient had a PCN reaction that required hospitalization:  no Has patient had a PCN reaction  occurring within the last 10 years: no If all of the above answers are "NO", then may proceed with Cephalosporin use.    Lab Results:  Results for orders placed or performed during the hospital encounter of 01/26/16 (from the past 48 hour(s))  Lipid panel     Status: Abnormal   Collection Time: 01/27/16  7:36 AM  Result Value Ref Range   Cholesterol 199 0 - 200 mg/dL   Triglycerides 696 <295 mg/dL   HDL 36 (L) >28 mg/dL   Total CHOL/HDL Ratio 5.5 RATIO   VLDL 24 0 - 40 mg/dL   LDL Cholesterol 413 (H) 0 - 99 mg/dL    Comment:        Total Cholesterol/HDL:CHD Risk Coronary Heart Disease Risk Table                     Men   Women  1/2 Average Risk   3.4   3.3  Average Risk       5.0   4.4  2 X Average Risk   9.6   7.1  3 X Average Risk  23.4   11.0        Use the calculated Patient Ratio above and the CHD Risk Table to  determine the patient's CHD Risk.        ATP III CLASSIFICATION (LDL):  <100     mg/dL   Optimal  161-096  mg/dL   Near or Above                    Optimal  130-159  mg/dL   Borderline  045-409  mg/dL   High  >811     mg/dL   Very High   TSH     Status: None   Collection Time: 01/27/16  7:36 AM  Result Value Ref Range   TSH 1.686 0.350 - 4.500 uIU/mL    Blood Alcohol level:  Lab Results  Component Value Date   ETH <5 01/26/2016   ETH <5 01/23/2016    Metabolic Disorder Labs:  No results found for: HGBA1C, MPG No results found for: PROLACTIN Lab Results  Component Value Date   CHOL 199 01/27/2016   TRIG 118 01/27/2016   HDL 36 (L) 01/27/2016   CHOLHDL 5.5 01/27/2016   VLDL 24 01/27/2016   LDLCALC 139 (H) 01/27/2016    Current Medications: Current Facility-Administered Medications  Medication Dose Route Frequency Provider Last Rate Last Dose  . acetaminophen (TYLENOL) tablet 650 mg  650 mg Oral Q6H PRN Audery Amel, MD      . alum & mag hydroxide-simeth (MAALOX/MYLANTA) 200-200-20 MG/5ML suspension 30 mL  30 mL Oral Q4H PRN Audery Amel,  MD      . benztropine (COGENTIN) tablet 0.5 mg  0.5 mg Oral BID Audery Amel, MD      . haloperidol (HALDOL) tablet 5 mg  5 mg Oral BID Audery Amel, MD      . magnesium hydroxide (MILK OF MAGNESIA) suspension 30 mL  30 mL Oral Daily PRN Audery Amel, MD       PTA Medications: Prescriptions Prior to Admission  Medication Sig Dispense Refill Last Dose  . OLANZapine (ZYPREXA) 10 MG tablet Take 3 tablets (30 mg total) by mouth at bedtime. 90 tablet 0 Past Week at Unknown time  . OLANZapine (ZYPREXA) 15 MG tablet Take 2 tablets (30 mg total) by mouth at bedtime. 30 tablet 0   . OLANZapine (ZYPREXA) 15 MG tablet Take 2 tablets (30 mg total) by mouth at bedtime. 14 tablet 0     Musculoskeletal: Strength & Muscle Tone: within normal limits Gait & Station: normal Patient leans: N/A  Psychiatric Specialty Exam: Physical Exam  Nursing note and vitals reviewed. Constitutional: He appears well-developed and well-nourished.  HENT:  Head: Normocephalic and atraumatic.  Eyes: Conjunctivae are normal. Pupils are equal, round, and reactive to light.  Neck: Normal range of motion.  Cardiovascular: Regular rhythm and normal heart sounds.   Respiratory: Effort normal. No respiratory distress.  GI: Soft.  Musculoskeletal: Normal range of motion.  Neurological: He is alert.  Skin: Skin is warm and dry.  Psychiatric: His affect is blunt. His speech is delayed. He is slowed. He expresses impulsivity. He expresses homicidal and suicidal ideation. He expresses no suicidal plans and no homicidal plans. He exhibits abnormal recent memory.    Review of Systems  Constitutional: Negative.   HENT: Negative.   Eyes: Negative.   Respiratory: Negative.   Cardiovascular: Negative.   Gastrointestinal: Negative.   Musculoskeletal: Negative.   Skin: Negative.   Neurological: Negative.   Psychiatric/Behavioral: Positive for depression, hallucinations and suicidal ideas. Negative for memory loss and  substance abuse. The patient is nervous/anxious. The  patient does not have insomnia.     Blood pressure 118/78, pulse 83, temperature 97.9 F (36.6 C), temperature source Oral, resp. rate 20, height 5\' 11"  (1.803 m), weight 118.8 kg (262 lb), SpO2 97 %.Body mass index is 36.54 kg/m.  General Appearance: Fairly Groomed  Eye Contact:  Fair  Speech:  Slow  Volume:  Decreased  Mood:  Dysphoric  Affect:  Full Range  Thought Process:  Goal Directed  Orientation:  Full (Time, Place, and Person)  Thought Content:  Delusions and Hallucinations: Auditory  Suicidal Thoughts:  Yes.  without intent/plan  Homicidal Thoughts:  Yes.  without intent/plan  Memory:  Immediate;   Good Recent;   Fair Remote;   Fair  Judgement:  Impaired  Insight:  Shallow  Psychomotor Activity:  Normal  Concentration:  Concentration: Fair  Recall:  FiservFair  Fund of Knowledge:  Fair  Language:  Fair  Akathisia:  No  Handed:  Right  AIMS (if indicated):     Assets:  Communication Skills Physical Health  ADL's:  Intact  Cognition:  Impaired,  Mild  Sleep:  Number of Hours: 6.3    Treatment Plan Summary: Daily contact with patient to assess and evaluate symptoms and progress in treatment, Medication management and Plan Patient admitted to the hospital because of persistent repeated statements of suicidal and homicidal ideation and psychosis. Complete failure to take care of himself or follow-up as an outpatient. It was becoming increasingly clear that nothing was likely to change and that he would keep coming back to the hospital. Patient has a history of schizophrenia diagnosis and reports hallucinations and delusions. He will be started on Haldol 5 mg twice a day along with a standing dose of Cogentin 0.5 mg twice a day. Patient has stated that he cannot afford medicine and has a severe problem with noncompliance. If he can tolerate this medicine he can be switched to a long-acting injectable. Social work can meet with  him and see where he is as far as any progress towards applying for disability or working on any kind of longer term living situation to take care of himself. At least he could perhaps be signed up for medication management. Patient's labs reviewed. EKG is not perfectly normal but is unremarkable. QT interval is acceptable. Lipid panel also largely unremarkable. TSH normal. Drug screen negative. Supportive counseling with patient.  Observation Level/Precautions:  15 minute checks  Laboratory:  HCG  Psychotherapy:    Medications:    Consultations:    Discharge Concerns:    Estimated LOS:  Other:     Physician Treatment Plan for Primary Diagnosis: Schizophrenia, paranoid (HCC) Long Term Goal(s): Improvement in symptoms so as ready for discharge  Short Term Goals: Ability to disclose and discuss suicidal ideas, Ability to demonstrate self-control will improve and Ability to identify and develop effective coping behaviors will improve  Physician Treatment Plan for Secondary Diagnosis: Principal Problem:   Schizophrenia, paranoid (HCC) Active Problems:   Noncompliance  Long Term Goal(s): Improvement in symptoms so as ready for discharge  Short Term Goals: Compliance with prescribed medications will improve and Ability to identify triggers associated with substance abuse/mental health issues will improve  I certify that inpatient services furnished can reasonably be expected to improve the patient's condition.    Mordecai RasmussenJohn Harlow Basley, MD 9/17/201712:14 PM

## 2016-01-27 NOTE — [facility] Suicide Risk Assessment (Signed)
[facility]    Nursing information obtained from:  Patient (Patient denies SI but endorses HI and AVH.) Demographic factors:    Current Mental Status:  Thoughts of violence towards others Loss Factors:    Historical Factors:    Risk Reduction Factors:     Total Time spent with patient: 1 hour Principal Problem: Schizophrenia, paranoid (HCC) Diagnosis:   Patient Active Problem List   Diagnosis Date Noted  . Schizophrenia, paranoid (HCC) [F20.0] 01/26/2016  . Antisocial personality disorder [F60.2] 01/23/2016  . Malingering [Z76.5] 01/23/2016  . Noncompliance [Z91.19] 01/23/2016  . Schizophrenia, paranoid type (HCC) [F20.0] 06/06/2014   Subjective Data: 50 year old man with a history of schizophrenia and antisocial behavior. He continues to report suicidal ideation. Has no rationale for it. Refuses to discuss a plan. He really gives the impression that he is holding this suicidal ideation as some kind of trump card rather than wanting to act on it. Doesn't appear to be particularly sad or discouraged are negative. He continues to claim that he has hallucinations and delusions but he never acts in a bizarre manner. No new physical complaints.  Continued Clinical Symptoms:  Alcohol Use Disorder Identification Test Final Score (AUDIT): 0 The "Alcohol Use Disorders Identification Test", Guidelines for Use in Primary Care, Second Edition.  World Science writerHealth Organization [facility](WHO). Score between 0-7:  no or low risk or alcohol related problems. Score between 8-15:  moderate risk of alcohol related problems. Score between 16-19:  high risk of alcohol related problems. Score 20 or above:  warrants further diagnostic evaluation for alcohol dependence and treatment.   CLINICAL FACTORS:   Schizophrenia:   Command hallucinatons Personality Disorders:   Cluster B   Musculoskeletal: Strength & Muscle Tone: within normal limits Gait & Station: normal Patient leans:  N/A  Psychiatric Specialty Exam: Physical Exam  Nursing note and vitals reviewed. Constitutional: He appears well-developed and well-nourished.  HENT:  Head: Normocephalic and atraumatic.  Eyes: Conjunctivae are normal. Pupils are equal, round, and reactive to light.  Neck: Normal range of motion.  Cardiovascular: Regular rhythm and normal heart sounds.   Respiratory: Effort normal. No respiratory distress.  GI: Soft.  Musculoskeletal: Normal range of motion.  Neurological: He is alert.  Skin: Skin is warm and dry.  Psychiatric: His affect is blunt. His speech is delayed. He is slowed. Cognition and memory are normal. He expresses impulsivity. He expresses homicidal and suicidal ideation. He expresses no suicidal plans and no homicidal plans.    Review of Systems  Constitutional: Negative.   HENT: Negative.   Eyes: Negative.   Respiratory: Negative.   Cardiovascular: Negative.   Gastrointestinal: Negative.   Musculoskeletal: Negative.   Skin: Negative.   Neurological: Negative.   Psychiatric/Behavioral: Positive for depression, hallucinations and suicidal ideas. Negative for substance abuse. The patient is nervous/anxious. The patient does not have insomnia.     Blood pressure 118/78, pulse 83, temperature 97.9 F (36.6 C), temperature source Oral, resp. rate 20, height 5\' 11"  (1.803 m), weight 118.8 kg (262 lb), SpO2 97 %.Body mass index is 36.54 kg/m.  General Appearance: Casual  Eye Contact:  Fair  Speech:  Slow  Volume:  Decreased  Mood:  Depressed  Affect:  Full Range  Thought Process:  Goal Directed  Orientation:  Full (Time, Place, and Person)  Thought Content:  Hallucinations: Auditory and Paranoid Ideation  Suicidal Thoughts:  Yes.  without intent/plan  Homicidal Thoughts:  Yes.  without intent/plan  Memory:  Immediate;  Good Recent;   Fair Remote;   Fair  Judgement:  Impaired  Insight:  Shallow  Psychomotor Activity:  Decreased  Concentration:   Concentration: Fair  Recall:  [employer] of Knowledge:  Fair  Language:  Fair  Akathisia:  No  Handed:  Right  AIMS (if indicated):     Assets:  Physical Health Resilience  ADL's:  Intact  Cognition:  Impaired,  Mild  Sleep:  Number of Hours: 6.3      COGNITIVE FEATURES THAT CONTRIBUTE TO RISK:  Closed-mindedness    SUICIDE RISK:   Mild:  Suicidal ideation of limited frequency, intensity, duration, and specificity.  There are no identifiable plans, no associated intent, mild dysphoria and related symptoms, good self-control (both objective and subjective assessment), few other risk factors, and identifiable protective factors, including available and accessible social support.   PLAN OF CARE: Patient continues to endorse suicidal ideation but he is not acting in a manner to suggest he wants to kill himself. His mood does not appear to be particularly down or labile. He is showing good self-care. They're I think he is a risk of self-harm or suicide simply because of diagnosis and his chronic poor functioning but I don't think he needs particular suicide precautions on the unit. Continue monitoring and engagement in groups and activities with medication prior to discharge.  I certify that inpatient services furnished can reasonably be expected to improve the patient's condition.  Mordecai Rasmussen, MD 01/27/2016, 12:08 PM

## 2016-01-27 NOTE — Plan of Care (Signed)
Problem: Education: Goal: Mental status will improve Outcome: Not Progressing Patient states that he "stills feel like I did when I came in yesterday".  Patient continues to report that he has homicidal ideations and endorses hearing voices.

## 2016-01-27 NOTE — Plan of Care (Signed)
Problem: Safety: Goal: Periods of time without injury will increase Outcome: Progressing Pt has remained free from injury since admission
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
## 2016-01-27 NOTE — [facility] Group Notes (Signed)
[facility] Group Notes:  (Nursing/MHT/Case Management/Adjunct)  Date:  01/27/2016  Time:  11:42 PM  Type of Therapy:  Psychoeducational Skills  Participation Level:  Active  Participation Quality:  Appropriate  Affect:  Flat  Cognitive:  Alert  Insight:  Limited  Engagement in Group:  Limited  Modes of Intervention:  Activity  Summary of Progress/Problems:  Mayra NeerJackie L Shoshana Johal 01/27/2016, 11:42 PM

## 2016-01-27 NOTE — Progress Notes (Signed)
D:  Patient continues to endorse auditory and visual hallucinations.  Patient denies SI.  Patient has interacted appropriately on the unit. A:  Patient encouraged to attend group sessions.  Patient educated on medications ordered by the physician.   R:  Patient safety maintained with 15 minute checks.  Patient did not attend group sessions.

## 2016-01-28 LAB — PROLACTIN: PROLACTIN: 9.6 ng/mL (ref 4.0–15.2)

## 2016-01-28 LAB — HEMOGLOBIN A1C
HEMOGLOBIN A1C: 5.8 % — AB (ref 4.8–5.6)
MEAN PLASMA GLUCOSE: 120 mg/dL

## 2016-01-28 MED ORDER — LORAZEPAM 2 MG/ML IJ SOLN: 2.0000 mg | Freq: Four times a day (QID) | INTRAMUSCULAR | Status: DC | PRN

## 2016-01-28 MED ORDER — METFORMIN HCL 500 MG PO TABS
500.0000 mg | ORAL_TABLET | Freq: Two times a day (BID) | ORAL | Status: DC
  Filled 2016-01-28 (×3): qty 1

## 2016-01-28 MED ORDER — LORAZEPAM 2 MG PO TABS: 2.0000 mg | ORAL_TABLET | Freq: Four times a day (QID) | ORAL | Status: DC | PRN

## 2016-01-28 MED ORDER — OLANZAPINE 10 MG PO TABS
10.0000 mg | ORAL_TABLET | Freq: Two times a day (BID) | ORAL | Status: DC
  Administered 2016-01-28: 10 mg via ORAL
  Filled 2016-01-28: qty 1
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
## 2016-01-28 NOTE — [facility] Group Notes (Signed)
[facility] Group Notes:  (Nursing/MHT/Case Management/Adjunct)  Date:  01/28/2016  Time:  4:39 PM  Type of Therapy:  Psychoeducational Skills  Participation Level:  None    Summary of Progress/Problems:  Mickey Farberamela M Moses Ellison 01/28/2016, 4:39 PM

## 2016-01-28 NOTE — Plan of Care (Signed)
Problem: Coping: Goal: Ability to demonstrate self-control will improve Outcome: Not Progressing Did not attend any groups.

## 2016-01-28 NOTE — Progress Notes (Cosign Needed)
Pt isolated to room. Affect irritable and suspicious. Would not participate in assessment. q15 safety checks maintained.

## 2016-01-28 NOTE — Progress Notes (Signed)
Patient was irritable this morning stated that the doctor was trying to give him Haldol which is allergic to him.Stayed in room most of the time.Minimal interactions with staff & peers.Patient compliant with zyprexa.Denies suicidal or homicidal ideations and AV hallucinations.Appetite & energy level good.Safety maintained.

## 2016-01-28 NOTE — [facility] Group Notes (Signed)
[facility] LCSW Group Therapy   01/28/2016 9:30am Type of Therapy: Group Therapy: Overcoming Obstacles   Participation Level: Patient invited but did not attend.   Participation Quality: Patient invited but did not attend    Hampton AbbotKadijah Jeani Fassnacht, MSW, [employer] IncLCSWA

## 2016-01-28 NOTE — Progress Notes (Signed)
Recreation Therapy Notes  Date: 09.18.17 Time: 1:00 pm Location: Craft Room  Group Topic: Self-expression  Goal Area(s) Addresses:  Patient will identify one color per emotion listed on wheel. Patient will verbalize benefit of using art as a means of self-expression. Patient will verbalize one emotion experienced during session. Patient will be educated on other forms of self-expression.  Behavioral Response: Did not attend  Intervention: Emotion Wheel  Activity: Patients were given an Emotion Wheel worksheet and instructed to pick a color for each emotion listed on the wheel.  Education: LRT educated patients on other forms of self-expression.  Education Outcome: Patient did not attend group.   Clinical Observations/Feedback: Patient did not attend group.  [name],Adithi Gammon M, LRT/CTRS 01/28/2016 2:15 PM

## 2016-01-28 NOTE — Progress Notes (Signed)
[facility] MD Progress Note  01/28/2016 2:14 PM JAKEB LAMPING  MRN:  [id number] Subjective:  Patient states he continues to hear voices.  He has been refusing haldol, which was started yesterday, as he says he is allergic to it.  Pt tells me he was at [facility] recently for 1 month. He claims [facility] lied to him and discharged him to the f... Shelter and the shelter did not have a bed for him.  He also was recently discharged from [facility] where he was hospitalized from 3 weeks.  He continues to be homeless but thinks that his disability check is about to start coming again and he thinks he has medicaid again.  Nurses reported he refused medications and has been irritable and withdrawn to room.  He has been making threats towards another pt.    Per nursing: Pt pacing unit, irritable and agitated. Pt stated to Clinical research associate that his real name is "Latin Brooke Dare" that he is "high up in the [place]" He stated that he had been in prison and hated child molesters. Pt believes that a peer on unit is a child molester. He was pacing and cursing unit while other Pt was in dayroom. He would not name Pt to Clinical research associate but stated he was only one in dayroom. He stated that he wasn't going to approach him first but if he got in his face he was going to "beat the shit out of him". Pt successfully redirected back to room without issue.   Principal Problem: Schizophrenia, paranoid (HCC) Diagnosis:   Patient Active Problem List   Diagnosis Date Noted  . Schizophrenia, paranoid (HCC) [F20.0] 01/26/2016  . Antisocial personality disorder [F60.2] 01/23/2016  . Malingering [Z76.5] 01/23/2016  . Noncompliance [Z91.19] 01/23/2016  . Schizophrenia, paranoid type (HCC) [F20.0] 06/06/2014   Total Time spent with patient: 30 minutes  Past Psychiatric History:   Past Medical History:  Past Medical History:  Diagnosis Date  . Anxiety   . Hypercholesteremia   . Hypertension   . Schizophrenia [facility])     Past Surgical History:  Procedure Laterality  Date  . CIRCUMCISION, NON-NEWBORN     Family History: History reviewed. No pertinent family history.   Family Psychiatric  History:   Social History:  History  Alcohol Use  . Yes    Comment: social drinker     History  Drug Use No    Social History   Social History  . Marital status: Legally Separated    Spouse name: N/A  . Number of children: N/A  . Years of education: N/A   Social History Main Topics  . Smoking status: Current Every Day Smoker    Packs/day: 2.00    Types: Cigarettes  . Smokeless tobacco: Never Used  . Alcohol use Yes     Comment: social drinker  . Drug use: No  . Sexual activity: Not Asked   Other Topics Concern  . None   Social History Narrative  . None    Current Medications: Current Facility-Administered Medications  Medication Dose Route Frequency Provider Last Rate Last Dose  . acetaminophen (TYLENOL) tablet 650 mg  650 mg Oral Q6H PRN Audery Amel, MD      . alum & mag hydroxide-simeth (MAALOX/MYLANTA) 200-200-20 MG/5ML suspension 30 mL  30 mL Oral Q4H PRN Audery Amel, MD      . magnesium hydroxide (MILK OF MAGNESIA) suspension 30 mL  30 mL Oral Daily PRN Audery Amel, MD      .  OLANZapine (ZYPREXA) tablet 10 mg  10 mg Oral BID Jimmy FootmanAndrea Hernandez-Gonzalez, MD        Lab Results:  Results for orders placed or performed during the hospital encounter of 01/26/16 (from the past 48 hour(s))  Hemoglobin A1c     Status: Abnormal   Collection Time: 01/27/16  7:36 AM  Result Value Ref Range   Hgb A1c MFr Bld 5.8 (H) 4.8 - 5.6 %    Comment: (NOTE)         Pre-diabetes: 5.7 - 6.4         Diabetes: >6.4         Glycemic control for adults with diabetes: <7.0    Mean Plasma Glucose 120 mg/dL    Comment: (NOTE) Performed At: [facility] [address] [place], [place] [id number] Mila HomerHancock William F MD [id number]:[phone]   Lipid panel     Status: Abnormal   Collection Time: 01/27/16  7:36 AM  Result Value Ref Range   Cholesterol  199 0 - 200 mg/dL   Triglycerides 956118 <213<150 mg/dL   HDL 36 (L) >08>40 mg/dL   Total CHOL/HDL Ratio 5.5 RATIO   VLDL 24 0 - 40 mg/dL   LDL Cholesterol 657139 (H) 0 - 99 mg/dL    Comment:        Total Cholesterol/HDL:CHD Risk Coronary Heart Disease Risk Table                     Men   Women  1/2 Average Risk   3.4   3.3  Average Risk       5.0   4.4  2 X Average Risk   9.6   7.1  3 X Average Risk  23.4   11.0        Use the calculated Patient Ratio above and the CHD Risk Table to determine the patient's CHD Risk.        ATP III CLASSIFICATION (LDL):  <100     mg/dL   Optimal  [id number]  mg/dL   Near or Above                    Optimal  130-159  mg/dL   Borderline  [id number]  mg/dL   High  >324>190     mg/dL   Very High   Prolactin     Status: None   Collection Time: 01/27/16  7:36 AM  Result Value Ref Range   Prolactin 9.6 4.0 - 15.2 ng/mL    Comment: (NOTE) Performed At: [facility] [address] [place], [place] [id number] Mila HomerHancock William F MD [id number]:[phone]   TSH     Status: None   Collection Time: 01/27/16  7:36 AM  Result Value Ref Range   TSH 1.686 0.350 - 4.500 uIU/mL    Blood Alcohol level:  Lab Results  Component Value Date   ETH <5 01/26/2016   ETH <5 01/23/2016    Metabolic Disorder Labs: Lab Results  Component Value Date   HGBA1C 5.8 (H) 01/27/2016   MPG 120 01/27/2016   Lab Results  Component Value Date   PROLACTIN 9.6 01/27/2016   Lab Results  Component Value Date   CHOL 199 01/27/2016   TRIG 118 01/27/2016   HDL 36 (L) 01/27/2016   CHOLHDL 5.5 01/27/2016   VLDL 24 01/27/2016   LDLCALC 139 (H) 01/27/2016    Physical Findings: AIMS:  , ,  ,  ,    CIWA:  COWS:     Musculoskeletal: Strength & Muscle Tone: within normal limits Gait & Station: normal Patient leans: N/A  Psychiatric Specialty Exam: Physical Exam  Constitutional: He is oriented to person, place, and time. He appears well-developed and well-nourished.  HENT:  Head:  Normocephalic and atraumatic.  Eyes: EOM are normal.  Neck: Normal range of motion.  Respiratory: Effort normal.  Musculoskeletal: Normal range of motion.  Neurological: He is alert and oriented to person, place, and time.    Review of Systems  Constitutional: Negative.   HENT: Negative.   Eyes: Negative.   Respiratory: Negative.   Cardiovascular: Negative.   Gastrointestinal: Negative.   Genitourinary: Negative.   Musculoskeletal: Negative.   Skin: Negative.   Neurological: Negative.   Endo/Heme/Allergies: Negative.   Psychiatric/Behavioral: Positive for hallucinations and suicidal ideas.    Blood pressure 124/76, pulse 80, temperature 97.8 F (36.6 C), temperature source Oral, resp. rate 20, height 5\' 11"  (1.803 m), weight 118.8 kg (262 lb), SpO2 98 %.Body mass index is 36.54 kg/m.  General Appearance: Well Groomed  Eye Contact:  Fair  Speech:  Clear and Coherent  Volume:  Normal  Mood:  Irritable  Affect:  Congruent  Thought Process:  Linear and Descriptions of Associations: Intact  Orientation:  Full (Time, Place, and Person)  Thought Content:  Hallucinations: Auditory  Suicidal Thoughts:  No  Homicidal Thoughts:  No  Memory:  Immediate;   Fair Recent;   Fair Remote;   Fair  Judgement:  Poor  Insight:  Shallow  Psychomotor Activity:  Normal  Concentration:  Concentration: Fair and Attention Span: Fair  Recall:  [employer] of Knowledge:  Fair  Language:  Fair  Akathisia:  No  Handed:    AIMS (if indicated):     Assets:  Manufacturing systems engineer Physical Health  ADL's:  Intact  Cognition:  WNL  Sleep:  Number of Hours: 6.3     Treatment Plan Summary:  Schizophrenia: will restart olanzapine which has been helpful for him in the past.  Will order olanzapine 10 mg po bid  Antisocial personality: h/o violence has been incarcerated for kidnap and recently for assault.  Malingering: Pt has reported SI and psychosis even when stable in order to seek admission as  he has multiple social stressors such  As lack of support, homelessness, and no income. 6 ER visits over the last 6 months.  Prediabetes: HbA1c is 5.8.  Will start metformin 500 mg po bid  Agitation: I will order ativan 2 mg q 6 h prn IM or PO  Dispo: most likely would have to d/c to shelter  F/u: would try to connect to [facility] as an IPRS pt     Jimmy Footman, MD 01/28/2016, 2:14 PM

## 2016-01-29 MED ORDER — PERPHENAZINE 4 MG PO TABS
4.0000 mg | ORAL_TABLET | Freq: Three times a day (TID) | ORAL | Status: DC
  Administered 2016-01-29 – 2016-01-30 (×4): 4 mg via ORAL
  Filled 2016-01-29 (×4): qty 1

## 2016-01-29 MED ORDER — OLANZAPINE 7.5 MG PO TABS
15.0000 mg | ORAL_TABLET | Freq: Two times a day (BID) | ORAL | Status: DC
  Administered 2016-01-29 – 2016-01-30 (×3): 15 mg via ORAL
  Filled 2016-01-29 (×3): qty 2

## 2016-01-29 NOTE — Progress Notes (Signed)
[facility] MD Progress Note  01/29/2016 2:16 PM Kenneth [name]  MRN:  [id number] Subjective:  Patient states he continues to hear voices.  He has been withdrawn to her room. He continues to be irritable.  During conversation he is constantly  using profanity.  He complains that Zyprexa does not work for him. He was to be prescribed back again with Trilafon which she says works very well for him. Patient appears paranoid and becomes easily agitated.  He continues to be homeless but thinks that his disability check is about to start coming again and he thinks he has medicaid again. Per our system he does not have medicaid  Yesterday nurses reported he refused medications and has been irritable and withdrawn to room.  He was making threats towards another pt.    Per nursing: Isolative to room, no interaction with peers and staffs, indifferent, flat, labile, guarded, and doesn't want to be bothered. Will continue to monitor behavior closely and maintain safety.  Principal Problem: Schizophrenia, paranoid (HCC) Diagnosis:   Patient Active Problem List   Diagnosis Date Noted  . Schizophrenia, paranoid (HCC) [F20.0] 01/26/2016  . Antisocial personality disorder [F60.2] 01/23/2016  . Malingering [Z76.5] 01/23/2016  . Noncompliance [Z91.19] 01/23/2016   Total Time spent with patient: 30 minutes  Past Psychiatric History:   Past Medical History:  Past Medical History:  Diagnosis Date  . Anxiety   . Hypercholesteremia   . Hypertension   . Schizophrenia [facility])     Past Surgical History:  Procedure Laterality Date  . CIRCUMCISION, NON-NEWBORN     Family History: History reviewed. No pertinent family history.   Family Psychiatric  History:   Social History:  History  Alcohol Use  . Yes    Comment: social drinker     History  Drug Use No    Social History   Social History  . Marital status: Legally Separated    Spouse name: N/A  . Number of children: N/A  . Years of education: N/A    Social History Main Topics  . Smoking status: Current Every Day Smoker    Packs/day: 2.00    Types: Cigarettes  . Smokeless tobacco: Never Used  . Alcohol use Yes     Comment: social drinker  . Drug use: No  . Sexual activity: Not Asked   Other Topics Concern  . None   Social History Narrative  . None    Current Medications: Current Facility-Administered Medications  Medication Dose Route Frequency Provider Last Rate Last Dose  . acetaminophen (TYLENOL) tablet 650 mg  650 mg Oral Q6H PRN Audery Amel, MD      . alum & mag hydroxide-simeth (MAALOX/MYLANTA) 200-200-20 MG/5ML suspension 30 mL  30 mL Oral Q4H PRN Audery Amel, MD      . LORazepam (ATIVAN) tablet 2 mg  2 mg Oral Q6H PRN Jimmy Footman, MD       Or  . LORazepam (ATIVAN) injection 2 mg  2 mg Intramuscular Q6H PRN Jimmy Footman, MD      . magnesium hydroxide (MILK OF MAGNESIA) suspension 30 mL  30 mL Oral Daily PRN Audery Amel, MD      . metFORMIN (GLUCOPHAGE) tablet 500 mg  500 mg Oral BID WC Jimmy Footman, MD      . OLANZapine (ZYPREXA) tablet 15 mg  15 mg Oral BID Jimmy Footman, MD   15 mg at 01/29/16 0920  . perphenazine (TRILAFON) tablet 4 mg  4 mg Oral  TID Jimmy Footman, MD   4 mg at 01/29/16 1327    Lab Results:  No results found for this or any previous visit (from the past 48 hour(s)).  Blood Alcohol level:  Lab Results  Component Value Date   ETH <5 01/26/2016   ETH <5 01/23/2016    Metabolic Disorder Labs: Lab Results  Component Value Date   HGBA1C 5.8 (H) 01/27/2016   MPG 120 01/27/2016   Lab Results  Component Value Date   PROLACTIN 9.6 01/27/2016   Lab Results  Component Value Date   CHOL 199 01/27/2016   TRIG 118 01/27/2016   HDL 36 (L) 01/27/2016   CHOLHDL 5.5 01/27/2016   VLDL 24 01/27/2016   LDLCALC 139 (H) 01/27/2016    Physical Findings: AIMS:  , ,  ,  ,    CIWA:    COWS:     Musculoskeletal: Strength &  Muscle Tone: within normal limits Gait & Station: normal Patient leans: N/A  Psychiatric Specialty Exam: Physical Exam  Constitutional: He is oriented to person, place, and time. He appears well-developed and well-nourished.  HENT:  Head: Normocephalic and atraumatic.  Eyes: EOM are normal.  Neck: Normal range of motion.  Respiratory: Effort normal.  Musculoskeletal: Normal range of motion.  Neurological: He is alert and oriented to person, place, and time.    Review of Systems  Constitutional: Negative.   HENT: Negative.   Eyes: Negative.   Respiratory: Negative.   Cardiovascular: Negative.   Gastrointestinal: Negative.   Genitourinary: Negative.   Musculoskeletal: Negative.   Skin: Negative.   Neurological: Negative.   Endo/Heme/Allergies: Negative.   Psychiatric/Behavioral: Positive for hallucinations and suicidal ideas.    Blood pressure 139/80, pulse 73, temperature 97.9 F (36.6 C), temperature source Oral, resp. rate 12, height 5\' 11"  (1.803 m), weight 118.8 kg (262 lb), SpO2 97 %.Body mass index is 36.54 kg/m.  General Appearance: Well Groomed  Eye Contact:  Fair  Speech:  Clear and Coherent  Volume:  Normal  Mood:  Irritable  Affect:  Congruent  Thought Process:  Linear and Descriptions of Associations: Intact  Orientation:  Full (Time, Place, and Person)  Thought Content:  Hallucinations: Auditory  Suicidal Thoughts:  No  Homicidal Thoughts:  No  Memory:  Immediate;   Fair Recent;   Fair Remote;   Fair  Judgement:  Poor  Insight:  Shallow  Psychomotor Activity:  Normal  Concentration:  Concentration: Fair and Attention Span: Fair  Recall:  [employer] of Knowledge:  Fair  Language:  Fair  Akathisia:  No  Handed:    AIMS (if indicated):     Assets:  Manufacturing systems engineer Physical Health  ADL's:  Intact  Cognition:  WNL  Sleep:  Number of Hours: 7     Treatment Plan Summary:  Schizophrenia: Continue olanzapine which has been increased to  olanzapine 15 mg by mouth twice a day.  I will also add Trilafon 4 mg by mouth 3 times a day as he prefers to be on this antipsychotic. Potentially this can be cross titrated with the olanzapine  Antisocial personality: h/o violence has been incarcerated for kidnap and recently for assault.  Malingering: Pt has reported SI and psychosis even when stable in order to seek admission as he has multiple social stressors such  As lack of support, homelessness, and no income. 6 ER visits over the last 6 months.  Prediabetes: HbA1c is 5.8.  Continue metformin 500 mg po bid  Agitation: continue  ativan 2 mg q 6 h prn IM or PO  Dispo: most likely would have to d/c to shelter  F/u: would try to connect to [facility] as an [facility] pt  Patient is still easily agitated, suspicious and paranoid. Not at baseline based on my prior interactions with him   Jimmy FootmanHernandez-Gonzalez,  Isak Sotomayor, MD 01/29/2016, 2:16 PM

## 2016-01-29 NOTE — Progress Notes (Signed)
15 minute checks maintained for safety, clinical and moral support provided, patient encouraged to continue to express feelings and demonstrate safe care. Patient remains free from harm, no aggressive behavior, no communication of threat, will continue to monitor.

## 2016-01-29 NOTE — Progress Notes (Signed)
Recreation Therapy Notes  Date: 09.19.17 Time: 9:30 am Location: Craft Room  Group Topic: Coping Skills  Goal Area(s) Addresses:  Patient will write at least one healthy coping skill. Patient will write goal towards recovery.  Behavioral Response: Did not attend  Intervention: Leisure Bucket List  Activity: Patients were given Leisure L-3 CommunicationsBucket List worksheets and instructed to write a healthy coping skill for each letter of the alphabet. Patients were also instructed to write a goal towards their recovery.  Education: LRT educated patients on why leisure is a good Associate Professorcoping skill.  Education Outcome: Patient did not attend group.  Clinical Observations/Feedback: Patient did not attend group.  [name],Jazzlin Clements M, LRT/CTRS 01/29/2016 10:16 AM

## 2016-01-29 NOTE — Progress Notes (Signed)
Isolative to room, no interaction with peers and staffs, indifferent, flat, labile, guarded, and doesn't want to be bothered. Will continue to monitor behavior closely and maintain safety.

## 2016-01-29 NOTE — Progress Notes (Signed)
D:Patient  Remained  In room  No participation  With  Unit programing , rough edge to conversation .  Patient stated slept good last night .Stated appetite is good and energy level  Is normal. Stated concentration is fair . Stated on Depression scale , hopeless and anxiety .( low 0-10 high) Denies suicidal  homicidal ideations  .  No auditory hallucinations  No pain concerns . Appropriate ADL'S. Interacting with peers and staff.  A: Encourage patient participation with unit programming . Instruction  Given on  Medication , verbalize understanding. R: Voice no other concerns. Staff continue to monitor

## 2016-01-29 NOTE — [facility] Group Notes (Signed)
[facility] Group Notes:  (Nursing/MHT/Case Management/Adjunct)  Date:  01/29/2016  Time:  3:31 AM  Type of Therapy:  Psychoeducational Skills  Participation Level:  Minimal  Participation Quality:  Appropriate  Affect:  Flat  Cognitive:  Appropriate  Insight:  Limited  Engagement in Group:  Limited  Modes of Intervention:  Clarification  Summary of Progress/Problems:  Kenneth [name] 01/29/2016, 3:31 AM

## 2016-01-29 NOTE — Tx Team (Signed)
Interdisciplinary Treatment and Diagnostic Plan Update  01/29/2016 Time of Session: 10:30am JAHLIL ZILLER MRN: [id number]  Principal Diagnosis: Schizophrenia, paranoid [facility])  Secondary Diagnoses: Principal Problem:   Schizophrenia, paranoid (HCC) Active Problems:   Antisocial personality disorder   Malingering   Noncompliance   Current Medications:  Current Facility-Administered Medications  Medication Dose Route Frequency Provider Last Rate Last Dose  . acetaminophen (TYLENOL) tablet 650 mg  650 mg Oral Q6H PRN Audery Amel, MD      . alum & mag hydroxide-simeth (MAALOX/MYLANTA) 200-200-20 MG/5ML suspension 30 mL  30 mL Oral Q4H PRN Audery Amel, MD      . LORazepam (ATIVAN) tablet 2 mg  2 mg Oral Q6H PRN Jimmy Footman, MD       Or  . LORazepam (ATIVAN) injection 2 mg  2 mg Intramuscular Q6H PRN Jimmy Footman, MD      . magnesium hydroxide (MILK OF MAGNESIA) suspension 30 mL  30 mL Oral Daily PRN Audery Amel, MD      . metFORMIN (GLUCOPHAGE) tablet 500 mg  500 mg Oral BID WC Jimmy Footman, MD      . OLANZapine (ZYPREXA) tablet 15 mg  15 mg Oral BID Jimmy Footman, MD   15 mg at 01/29/16 0920  . perphenazine (TRILAFON) tablet 4 mg  4 mg Oral TID Jimmy Footman, MD       PTA Medications: Prescriptions Prior to Admission  Medication Sig Dispense Refill Last Dose  . OLANZapine (ZYPREXA) 10 MG tablet Take 3 tablets (30 mg total) by mouth at bedtime. 90 tablet 0 Past Week at Unknown time  . OLANZapine (ZYPREXA) 15 MG tablet Take 2 tablets (30 mg total) by mouth at bedtime. 30 tablet 0   . OLANZapine (ZYPREXA) 15 MG tablet Take 2 tablets (30 mg total) by mouth at bedtime. 14 tablet 0     Treatment Modalities: Medication Management, Group therapy, Case management,  1 to 1 session with clinician, Psychoeducation, Recreational therapy.   Physician Treatment Plan for Primary Diagnosis: Schizophrenia, paranoid  (HCC) Long Term Goal(s): Improvement in symptoms so as ready for discharge   Short Term Goals: Ability to verbalize feelings will improve, Ability to demonstrate self-control will improve, Ability to identify and develop effective coping behaviors will improve, Compliance with prescribed medications will improve and Ability to identify triggers associated with substance abuse/mental health issues will improve  Medication Management: Evaluate patient's response, side effects, and tolerance of medication regimen.  Therapeutic Interventions: 1 to 1 sessions, Unit Group sessions and Medication administration.  Evaluation of Outcomes: Progressing  Physician Treatment Plan for Secondary Diagnosis: Principal Problem:   Schizophrenia, paranoid (HCC) Active Problems:   Antisocial personality disorder   Malingering   Noncompliance  Long Term Goal(s): Improvement in symptoms so as ready for discharge  Short Term Goals: Ability to identify changes in lifestyle to reduce recurrence of condition will improve and Ability to identify triggers associated with substance abuse/mental health issues will improve  Medication Management: Evaluate patient's response, side effects, and tolerance of medication regimen.  Therapeutic Interventions: 1 to 1 sessions, Unit Group sessions and Medication administration.  Evaluation of Outcomes: Progressing   RN Treatment Plan for Primary Diagnosis: Schizophrenia, paranoid (HCC) Long Term Goal(s): Knowledge of disease and therapeutic regimen to maintain health will improve  Short Term Goals: Ability to demonstrate self-control, Ability to identify and develop effective coping behaviors will improve and Compliance with prescribed medications will improve  Medication Management: RN will administer medications as  ordered by provider, will assess and evaluate patient's response and provide education to patient for prescribed medication. RN will report any adverse and/or  side effects to prescribing provider.  Therapeutic Interventions: 1 on 1 counseling sessions, Psychoeducation, Medication administration, Evaluate responses to treatment, Monitor vital signs and CBGs as ordered, Perform/monitor CIWA, COWS, AIMS and Fall Risk screenings as ordered, Perform wound care treatments as ordered.  Evaluation of Outcomes: Progressing   LCSW Treatment Plan for Primary Diagnosis: Schizophrenia, paranoid (HCC) Long Term Goal(s): Safe transition to appropriate next level of care at discharge, Engage patient in therapeutic group addressing interpersonal concerns.  Short Term Goals: Engage patient in aftercare planning with referrals and resources, Increase social support, Identify triggers associated with mental health/substance abuse issues and Increase skills for wellness and recovery  Therapeutic Interventions: Assess for all discharge needs, 1 to 1 time with Social worker, Explore available resources and support systems, Assess for adequacy in community support network, Educate family and significant other(s) on suicide prevention, Complete Psychosocial Assessment, Interpersonal group therapy.  Evaluation of Outcomes: Progressing   Progress in Treatment: Attending groups: No. Participating in groups: No. Taking medication as prescribed: Yes. Toleration medication: Yes. Family/Significant other contact made: No, will contact:  attempted, no response from Pt's mother Patient understands diagnosis: Yes.,but has minimal insight  Discussing patient identified problems/goals with staff: Yes. Medical problems stabilized or resolved: No. Denies suicidal/homicidal ideation: Yes. Issues/concerns per patient self-inventory: No. Other:    New problem(s) identified: No, Describe:     New Short Term/Long Term Goal(s):  Discharge Plan or Barriers: Pt is currently homeless, with no funding for placement,  Will likely be referred to shelter and [facility] referral will be made if  Pt consents   Reason for Continuation of Hospitalization: Other; describe remains irritable,agressive language,  hallucinations, not at baseline  Estimated Length of Stay:3-5 days  Attendees: Patient:Kenneth [name] 01/29/2016 11:07 AM  Physician: Radene JourneyAndrea Hernandez, MD 01/29/2016 11:07 AM  Nursing: Hulan AmatoGwen Farrish, RN 01/29/2016 11:07 AM  RN Care Manager: 01/29/2016 11:07 AM  Social Worker: Jake SharkSara Cathyrn Deas, LCSW 01/29/2016 11:07 AM  Recreational Therapist: Hershal CoriaBeth Greene LRT 01/29/2016 11:07 AM  Other:  01/29/2016 11:07 AM  Other:  01/29/2016 11:07 AM  Other: 01/29/2016 11:07 AM    Scribe for Treatment Team: Cleda DaubSara P Maxmillian Carsey, LCSW 01/29/2016 11:07 AM

## 2016-01-29 NOTE — [facility] Group Notes (Signed)
[facility] LCSW Group Therapy   01/29/2016 3pm  Type of Therapy: Group Therapy   Participation Level: Invited but did not attend.  Participation Quality: Invited but did not attend.    Lyris Hitchman, MSW, LCSWA   

## 2016-01-30 MED ORDER — OLANZAPINE 7.5 MG PO TABS
15.0000 mg | ORAL_TABLET | Freq: Two times a day (BID) | ORAL | Status: AC
  Administered 2016-01-30: 15 mg via ORAL
  Filled 2016-01-30: qty 2

## 2016-01-30 MED ORDER — OLANZAPINE 10 MG PO TABS: 30.0000 mg | ORAL_TABLET | Freq: Every day | ORAL | Status: DC

## 2016-01-30 MED ORDER — PERPHENAZINE 4 MG PO TABS
8.0000 mg | ORAL_TABLET | Freq: Three times a day (TID) | ORAL | Status: DC
  Administered 2016-01-30 – 2016-02-05 (×19): 8 mg via ORAL
  Filled 2016-01-30 (×19): qty 2

## 2016-01-30 NOTE — [facility] Group Notes (Signed)
[facility] Group Notes:  (Nursing/MHT/Case Management/Adjunct)  Date:  01/30/2016  Time:  1:49 AM  Type of Therapy:  Group Therapy  Participation Level:  Active  Participation Quality:  Attentive  Affect:  Flat  Cognitive:  Alert  Insight:  Appropriate  Engagement in Group:  Limited  Modes of Intervention:  Discussion  Summary of Progress/Problems:   Kenneth [name] 01/30/2016, 1:49 AM

## 2016-01-30 NOTE — [facility] Group Notes (Signed)
[facility] Group Notes:  (Nursing/MHT/Case Management/Adjunct)  Date:  01/30/2016  Time:  4:12 PM  Type of Therapy:  Psychoeducational Skills  Participation Level:  Did Not Attend  Lynelle SmokeCara Travis [facility] 01/30/2016, 4:12 PM

## 2016-01-30 NOTE — [facility] Suicide Risk Assessment (Signed)
[facility] INPATIENT:  Family/Significant Other Suicide Prevention Education  Suicide Prevention Education:  Family/Significant Other Refusal to Support Patient after Discharge:  Suicide Prevention Education Not Provided:  Patient has identified home of family/significant other as the place the patient will be residing after discharge.  With written consent of the patient, two attempts were made to provide Suicide Prevention Education to Kenneth [name] [phone] (step-mother).  This person indicates he/she will not be responsible for the patient after discharge. Stepmother informed CSW that patient has disability hearing on 02/07/2016 at 10:00am at the [place] courthouse. Stepmother refused to let patient stay with her until hearing and is unwilling to provide transportation to patient.   Kenneth [name] MSW, LCSWA 01/30/2016,4:59 PM

## 2016-01-30 NOTE — Plan of Care (Signed)
Problem: Coping: Goal: Ability to verbalize frustrations and anger appropriately will improve Outcome: Not Progressing Refusing to attend groups

## 2016-01-30 NOTE — Tx Team (Signed)
Interdisciplinary Treatment and Diagnostic Plan Update  01/30/2016 Time of Session: 11:00am Kenneth [name] MRN: [id number]  Principal Diagnosis: Schizophrenia, paranoid [facility])  Secondary Diagnoses: Principal Problem:   Schizophrenia, paranoid (HCC) Active Problems:   Antisocial personality disorder   Malingering   Noncompliance   Current Medications:  Current Facility-Administered Medications  Medication Dose Route Frequency Provider Last Rate Last Dose  . acetaminophen (TYLENOL) tablet 650 mg  650 mg Oral Q6H PRN Audery Amel, MD      . alum & mag hydroxide-simeth (MAALOX/MYLANTA) 200-200-20 MG/5ML suspension 30 mL  30 mL Oral Q4H PRN Audery Amel, MD      . LORazepam (ATIVAN) tablet 2 mg  2 mg Oral Q6H PRN Jimmy Footman, MD       Or  . LORazepam (ATIVAN) injection 2 mg  2 mg Intramuscular Q6H PRN Jimmy Footman, MD      . magnesium hydroxide (MILK OF MAGNESIA) suspension 30 mL  30 mL Oral Daily PRN Audery Amel, MD      . metFORMIN (GLUCOPHAGE) tablet 500 mg  500 mg Oral BID WC Jimmy Footman, MD      . OLANZapine (ZYPREXA) tablet 15 mg  15 mg Oral BID Jimmy Footman, MD   15 mg at 01/30/16 0800  . perphenazine (TRILAFON) tablet 4 mg  4 mg Oral TID Jimmy Footman, MD   4 mg at 01/30/16 1138   PTA Medications: Prescriptions Prior to Admission  Medication Sig Dispense Refill Last Dose  . OLANZapine (ZYPREXA) 10 MG tablet Take 3 tablets (30 mg total) by mouth at bedtime. 90 tablet 0 Past Week at Unknown time  . OLANZapine (ZYPREXA) 15 MG tablet Take 2 tablets (30 mg total) by mouth at bedtime. 30 tablet 0   . OLANZapine (ZYPREXA) 15 MG tablet Take 2 tablets (30 mg total) by mouth at bedtime. 14 tablet 0     Treatment Modalities: Medication Management, Group therapy, Case management,  1 to 1 session with clinician, Psychoeducation, Recreational therapy.   Physician Treatment Plan for Primary Diagnosis:  Schizophrenia, paranoid (HCC) Long Term Goal(s): Improvement in symptoms so as ready for discharge   Short Term Goals: Ability to verbalize feelings will improve, Ability to demonstrate self-control will improve, Ability to identify and develop effective coping behaviors will improve, Compliance with prescribed medications will improve and Ability to identify triggers associated with substance abuse/mental health issues will improve  Medication Management: Evaluate patient's response, side effects, and tolerance of medication regimen.  Therapeutic Interventions: 1 to 1 sessions, Unit Group sessions and Medication administration.  Evaluation of Outcomes: Progressing  Physician Treatment Plan for Secondary Diagnosis: Principal Problem:   Schizophrenia, paranoid (HCC) Active Problems:   Antisocial personality disorder   Malingering   Noncompliance  Long Term Goal(s): Improvement in symptoms so as ready for discharge  Short Term Goals: Ability to identify changes in lifestyle to reduce recurrence of condition will improve and Ability to identify triggers associated with substance abuse/mental health issues will improve  Medication Management: Evaluate patient's response, side effects, and tolerance of medication regimen.  Therapeutic Interventions: 1 to 1 sessions, Unit Group sessions and Medication administration.  Evaluation of Outcomes: Progressing   RN Treatment Plan for Primary Diagnosis: Schizophrenia, paranoid (HCC) Long Term Goal(s): Knowledge of disease and therapeutic regimen to maintain health will improve  Short Term Goals: Ability to demonstrate self-control, Ability to identify and develop effective coping behaviors will improve and Compliance with prescribed medications will improve  Medication Management: RN will  administer medications as ordered by provider, will assess and evaluate patient's response and provide education to patient for prescribed medication. RN will  report any adverse and/or side effects to prescribing provider.  Therapeutic Interventions: 1 on 1 counseling sessions, Psychoeducation, Medication administration, Evaluate responses to treatment, Monitor vital signs and CBGs as ordered, Perform/monitor CIWA, COWS, AIMS and Fall Risk screenings as ordered, Perform wound care treatments as ordered.  Evaluation of Outcomes: Progressing   LCSW Treatment Plan for Primary Diagnosis: Schizophrenia, paranoid (HCC) Long Term Goal(s): Safe transition to appropriate next level of care at discharge, Engage patient in therapeutic group addressing interpersonal concerns.  Short Term Goals: Engage patient in aftercare planning with referrals and resources, Increase social support, Identify triggers associated with mental health/substance abuse issues and Increase skills for wellness and recovery  Therapeutic Interventions: Assess for all discharge needs, 1 to 1 time with Social worker, Explore available resources and support systems, Assess for adequacy in community support network, Educate family and significant other(s) on suicide prevention, Complete Psychosocial Assessment, Interpersonal group therapy.  Evaluation of Outcomes: Progressing   Progress in Treatment: Attending groups: No. Participating in groups: No. Taking medication as prescribed: Yes. Toleration medication: Yes. Family/Significant other contact made: No, will contact:  have attempted patient's step mother. She has not contacted CSW's since patient's admission. Patient understands diagnosis: Yes., But has minimal insight. Discussing patient identified problems/goals with staff: Yes. Medical problems stabilized or resolved: Yes. Denies suicidal/homicidal ideation: Yes. Issues/concerns per patient self-inventory: No. Other: n/a  New problem(s) identified: None identified at this time.  New Short Term/Long Term Goal(s): None identified at this time.  Discharge Plan or Barriers:  Patient was referred to [facility] for ACTT team. CSW still access discharge plan.  Reason for Continuation of Hospitalization: Delusions  Hallucinations Medication stabilization  Estimated Length of Stay: 5 days.  Attendees: Patient:Kenneth [name] 01/30/2016 11:55 AM  Physician: Dr. Radene JourneyAndrea HernandezJayme Cloud- GonzaSabino Dicklez, MD 01/30/2016 11:55 AM  Nursing: Hulan AmatoGwen Farrish, RN 01/30/2016 11:55 AM  RN Care Manager: 01/30/2016 11:55 AM  Social Worker: Fredrich BirksAmaris G. Garnette CzechSampson MSW, LCSWA 01/30/2016 11:55 AM  Recreational Therapist: Jacquelynn CreeElizabeth M. Greene, LRT/CTRS 01/30/2016 11:55 AM  Other:  01/30/2016 11:55 AM  Other:  01/30/2016 11:55 AM  Other: 01/30/2016 11:55 AM    Scribe for Treatment Team: Arelia LongestAmaris G Tailey Top, LCSWA 01/30/2016 12:04 PM
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
## 2016-01-30 NOTE — Progress Notes (Signed)
Patient refused  metformin this am shift . Stated he dosent know where he is going . Stated he slept  Fine . Appetite good  Patient passes Clinical research associatewriter in hall stated " the [facility]   Need to stop  Telling lies on him " Speech  Pressured Isolates to room , no interations with peers [facility]  Again later in shift to staff . Spoke to mother on phone . Patient  Noted to attempt to scare staff as they are come to  His room  A: Encourage patient participation with unit programming . Instruction  Given on  Medication , verbalize understanding. R: Voice no other concerns. Staff continue to monitor

## 2016-01-31 NOTE — [facility] Group Notes (Signed)
Goals Group  Date/Time: 01/31/2016, 9:00AM Type of Therapy and Topic: Group Therapy: Goals Group: SMART Goals  ?  Participation Level: Moderate  ?  Description of Group:  ?  The purpose of a daily goals group is to assist and guide patients in setting recovery/wellness-related goals. The objective is to set goals as they relate to the crisis in which they were admitted. Patients will be using SMART goal modalities to set measurable goals. Characteristics of realistic goals will be discussed and patients will be assisted in setting and processing how one will reach their goal. Facilitator will also assist patients in applying interventions and coping skills learned in psycho-education groups to the SMART goal and process how one will achieve defined goal.  ?  Therapeutic Goals:  ?  -Patients will develop and document one goal related to or their crisis in which brought them into treatment.  -Patients will be guided by LCSW using SMART goal setting modality in how to set a measurable, attainable, realistic and time sensitive goal.  -Patients will process barriers in reaching goal.  -Patients will process interventions in how to overcome and successful in reaching goal.  ?  Patient's Goal: Pt invited but did not attend. ?  Therapeutic Modalities:  Motivational Interviewing  Cognitive Behavioral Therapy  Crisis Intervention Model  SMART goals setting   Breionna Punt, MSW, LCSW-A 

## 2016-01-31 NOTE — Plan of Care (Signed)
Problem: Coping: Goal: Ability to verbalize frustrations and anger appropriately will improve Outcome: Not Progressing Not participating   With unit programing

## 2016-01-31 NOTE — Progress Notes (Signed)
[facility]  01/31/2016 12:48 PM Kenneth [name]  MRN:  [id number]  Subjective: Kenneth [name] is disorganized and delusional today. He discloses to me that he is not his real name and he is waiting for "his people" to come and help him. He has no somatic complaints. Sleep and appetite are good. He adamantly opposes any new medications. He feels that Zyprexa has not been working. He refuses to take metformin for prediabetes. He is happy to continue on Trilafon. I took care of this patient several years ago and he improved on Trilafon. He has a history of violence but his behavior on the unit has not dangerous. He lost his disability while in prison and his mother is helping him to reinstate it. He has a hearing in the on September 28 for that.   Principal Problem: Schizophrenia, paranoid (HCC) Diagnosis:   Patient Active Problem List   Diagnosis Date Noted  . Schizophrenia, paranoid (HCC) [F20.0] 01/26/2016  . Antisocial personality disorder [F60.2] 01/23/2016  . Malingering [Z76.5] 01/23/2016  . Noncompliance [Z91.19] 01/23/2016   Total Time spent with patient: 30 minutes  Past Psychiatric History:   Past Medical History:  Past Medical History:  Diagnosis Date  . Anxiety   . Hypercholesteremia   . Hypertension   . Schizophrenia [facility](HCC)     Past Surgical History:  Procedure Laterality Date  . CIRCUMCISION, NON-NEWBORN     Family History: History reviewed. No pertinent family history.   Family Psychiatric  History:   Social History:  History  Alcohol Use  . Yes    Comment: social drinker     History  Drug Use No    Social History   Social History  . Marital status: Legally Separated    Spouse name: N/A  . Number of children: N/A  . Years of education: N/A   Social History Main Topics  . Smoking status: Current Every Day Smoker    Packs/day: 2.00    Types: Cigarettes  . Smokeless tobacco: Never Used  . Alcohol use Yes     Comment: social drinker  . Drug  use: No  . Sexual activity: Not Asked   Other Topics Concern  . None   Social History Narrative  . None    Current Medications: Current Facility-Administered Medications  Medication Dose Route Frequency Provider Last Rate Last Dose  . acetaminophen (TYLENOL) tablet 650 mg  650 mg Oral Q6H PRN Audery AmelJohn T Clapacs, MD      . alum & mag hydroxide-simeth (MAALOX/MYLANTA) 200-200-20 MG/5ML suspension 30 mL  30 mL Oral Q4H PRN Audery AmelJohn T Clapacs, MD      . LORazepam (ATIVAN) tablet 2 mg  2 mg Oral Q6H PRN Jimmy FootmanAndrea Hernandez-Gonzalez, MD       Or  . LORazepam (ATIVAN) injection 2 mg  2 mg Intramuscular Q6H PRN Jimmy FootmanAndrea Hernandez-Gonzalez, MD      . magnesium hydroxide (MILK OF MAGNESIA) suspension 30 mL  30 mL Oral Daily PRN Audery AmelJohn T Clapacs, MD      . perphenazine (TRILAFON) tablet 8 mg  8 mg Oral TID Jimmy FootmanAndrea Hernandez-Gonzalez, MD   8 mg at 01/31/16 0808    Lab Results:  No results found for this or any previous visit (from the past 48 hour(s)).  Blood Alcohol level:  Lab Results  Component Value Date   [facility] <5 01/26/2016   ETH <5 01/23/2016    Metabolic Disorder Labs: Lab Results  Component Value Date   HGBA1C 5.8 (H)  01/27/2016   MPG 120 01/27/2016   Lab Results  Component Value Date   PROLACTIN 9.6 01/27/2016   Lab Results  Component Value Date   CHOL 199 01/27/2016   TRIG 118 01/27/2016   HDL 36 (L) 01/27/2016   CHOLHDL 5.5 01/27/2016   VLDL 24 01/27/2016   LDLCALC 139 (H) 01/27/2016    Physical Findings: AIMS:  , ,  ,  ,    CIWA:    COWS:     Musculoskeletal: Strength & Muscle Tone: within normal limits Gait & Station: normal Patient leans: N/A  Psychiatric Specialty Exam: Physical Exam  Nursing note and vitals reviewed.   Review of Systems  Psychiatric/Behavioral: Positive for depression and hallucinations. Negative for substance abuse and suicidal ideas.  All other systems reviewed and are negative.   Blood pressure 127/70, pulse 72, temperature 97.8 F (36.6  C), temperature source Oral, resp. rate 18, height 5\' 11"  (1.803 m), weight 118.8 kg (262 lb), SpO2 97 %.Body mass index is 36.54 kg/m.  General Appearance: Well Groomed  Eye Contact:  Fair  Speech:  Clear and Coherent  Volume:  Normal  Mood:  Irritable  Affect:  Congruent  Thought Process:  Linear  Orientation:  Full (Time, Place, and Person)  Thought Content:  Delusions, Hallucinations: Auditory and Paranoid Ideation  Suicidal Thoughts:  No  Homicidal Thoughts:  No  Memory:  Immediate;   Fair Recent;   Fair Remote;   Fair  Judgement:  Poor  Insight:  Shallow  Psychomotor Activity:  Normal  Concentration:  Concentration: Fair and Attention Span: Fair  Recall:  [employer] of Knowledge:  Fair  Language:  Fair  Akathisia:  No  Handed:    AIMS (if indicated):     Assets:  Manufacturing systems engineer Physical Health  ADL's:  Intact  Cognition:  WNL  Sleep:  Number of Hours: 6.3     Treatment Plan Summary:  Kenneth [name] has a history of schizophrenia admitted floridly psychotic.   1. Schizophrenia. We discontinue olanzapine as the patient refuses and continue Trilafon 8 mg 3 times a day. The patient was treated with Clozaril at [facility] earlier this year and he did very well. This treatment is no longer an option as he is homeless and uninsured.  2. Antisocial personality. There is history of violence. He had been been incarcerated for kidnaping and recently for assault.  3. H/O Malingering. The patient in the past reported SI and psychosis even when stable in order to seek admission as he has multiple social stressors. There were 6 ER visits over the last 6 months.  4. Prediabetes. HbA1c is 5.8. He refuses Metformin.  5. Agitation. Ativan is available.    6. Metabolic syndrome monitoring. Lipid panel and TSH are normal.  7. EKG. Normal sinus rhythm. QT 350.  8. Disposition. He will be discharged to the homeless shelter. We will attempt to connect him to ACT team.    Kristine Linea, MD 01/31/2016, 12:48 PM

## 2016-01-31 NOTE — [facility] Group Notes (Signed)
[facility] LCSW Group Therapy   01/31/2016 1:00 pm   Type of Therapy: Group Therapy   Participation Level: Pt invited but did not attend.  Participation Quality: Pt invited but did not attend.    Hampton AbbotKadijah Reizy Dunlow, MSW, LCSW-A 01/31/2016, 2:06PM

## 2016-01-31 NOTE — Progress Notes (Signed)
Patient  Out of bed for meals  And phone calls . Patient would not participate with unit program. Limited interaction with peers on the unit  Appetite good and voice  No concerns around sleep. Patient continues to talk about the Luminaties  As people that would do mean things  To other  People . Patient calling  A Staff  Member a Luminati   bitch .  No pain concerns . Appropriate ADL'S. Interacting with peers and staff.  A: Encourage patient participation with unit programming . Instruction  Given on  Medication , verbalize understanding. R: Voice no other concerns. Staff continue to monitor

## 2016-01-31 NOTE — Progress Notes (Signed)
Recreation Therapy Notes  Date: 09.21.17 Time: 9:30 am Location: Craft Room  Group Topic: Coping Skills/Leisure Education  Goal Area(s) Addresses:  Patient will identify things they are grateful for.  Patient will identify how being grateful can influence their decision making.  Behavioral Response: Did not attend  Intervention: Grateful Wheel  Activity: Patients were given an I Am Grateful For worksheet and instructed to write things they are grateful for under each category.  Education: LRT educated group on why it is important to be grateful.  Education Outcome: Patient did not attend group.   Clinical Observations/Feedback: Patient did not attend group.  Ritesh Opara M, LRT/CTRS 01/31/2016 10:09 AM 

## 2016-01-31 NOTE — Progress Notes (Signed)
Observed in the day room until snack time, rude, dismissive, flat and blunted, "I don't want to talk to you ..." patient said on approach.

## 2016-02-01 NOTE — Progress Notes (Addendum)
[facility]  02/01/2016 5:40 PM Kenneth [name]  MRN:  [id number]  Subjective: Kenneth [name] has a history of schizophrenia and great violence. He spent 17 years in prison for abduction of the Child psychotherapistsocial worker. He was hospitalized here 2 years ago following release from jail. He briefly stayed at the group home. He now is homeless and has no income or health insurance. He has appointment with a disability of his psychiatrist on Thursday. [facility] and responded well to clozapine. This option is no longer available due to lack of resources. In the past the patient did well on Trilafon and insists to continue. Dr. Ardyth HarpsHernandez tried to put him on Zyprexa at the patient refused. I do remember that he did well on Trilafon 2 years ago.  Today, the patient is disorganized and delusional. He talks about his multiple aliases and multiple social security numbers. He tries to disclose to me some secret information but in the end he decided to withhold it. Yesterday he agreed to stay in the hospital until his Thursday appointment. Today he wants to be discharged. He will consider going to a group home on [address] but has no resources now. He is rather intrusive and conspiratorial. He remembers me well from his previous admission. He has no somatic complaints. Sleep and appetite are good. He adamantly opposes any new medications. He feels that Zyprexa has not been working. He refuses to take metformin for prediabetes. He is happy to continue on Trilafon. I took care of this patient several years ago and he improved on Trilafon. He has a history of violence but his behavior on the unit so far has not been dangerous.    Principal Problem: Schizophrenia, paranoid (HCC) Diagnosis:   Patient Active Problem List   Diagnosis Date Noted  . Schizophrenia, paranoid (HCC) [F20.0] 01/26/2016  . Antisocial personality disorder [F60.2] 01/23/2016  . Malingering [Z76.5] 01/23/2016  . Noncompliance  [Z91.19] 01/23/2016   Total Time spent with patient: 30 minutes  Past Psychiatric History:   Past Medical History:  Past Medical History:  Diagnosis Date  . Anxiety   . Hypercholesteremia   . Hypertension   . Schizophrenia [facility](HCC)     Past Surgical History:  Procedure Laterality Date  . CIRCUMCISION, NON-NEWBORN     Family History: History reviewed. No pertinent family history.   Family Psychiatric  History:   Social History:  History  Alcohol Use  . Yes    Comment: social drinker     History  Drug Use No    Social History   Social History  . Marital status: Legally Separated    Spouse name: N/A  . Number of children: N/A  . Years of education: N/A   Social History Main Topics  . Smoking status: Current Every Day Smoker    Packs/day: 2.00    Types: Cigarettes  . Smokeless tobacco: Never Used  . Alcohol use Yes     Comment: social drinker  . Drug use: No  . Sexual activity: Not Asked   Other Topics Concern  . None   Social History Narrative  . None    Current Medications: Current Facility-Administered Medications  Medication Dose Route Frequency Provider Last Rate Last Dose  . acetaminophen (TYLENOL) tablet 650 mg  650 mg Oral Q6H PRN Audery AmelJohn T Clapacs, MD      . alum & mag hydroxide-simeth (MAALOX/MYLANTA) 200-200-20 MG/5ML suspension 30 mL  30 mL Oral Q4H PRN Audery AmelJohn T Clapacs,  MD      . LORazepam (ATIVAN) tablet 2 mg  2 mg Oral Q6H PRN Jimmy Footman, MD       Or  . LORazepam (ATIVAN) injection 2 mg  2 mg Intramuscular Q6H PRN Jimmy Footman, MD      . magnesium hydroxide (MILK OF MAGNESIA) suspension 30 mL  30 mL Oral Daily PRN Audery Amel, MD      . perphenazine (TRILAFON) tablet 8 mg  8 mg Oral TID Jimmy Footman, MD   8 mg at 02/01/16 1734    Lab Results:  No results found for this or any previous visit (from the past 48 hour(s)).  Blood Alcohol level:  Lab Results  Component Value Date   ETH <5 01/26/2016    ETH <5 01/23/2016    Metabolic Disorder Labs: Lab Results  Component Value Date   HGBA1C 5.8 (H) 01/27/2016   MPG 120 01/27/2016   Lab Results  Component Value Date   PROLACTIN 9.6 01/27/2016   Lab Results  Component Value Date   CHOL 199 01/27/2016   TRIG 118 01/27/2016   HDL 36 (L) 01/27/2016   CHOLHDL 5.5 01/27/2016   VLDL 24 01/27/2016   LDLCALC 139 (H) 01/27/2016    Physical Findings: AIMS:  , ,  ,  ,    CIWA:    COWS:     Musculoskeletal: Strength & Muscle Tone: within normal limits Gait & Station: normal Patient leans: N/A  Psychiatric Specialty Exam: Physical Exam  Nursing note and vitals reviewed.   Review of Systems  Psychiatric/Behavioral: Positive for depression and hallucinations. Negative for substance abuse and suicidal ideas.  All other systems reviewed and are negative.   Blood pressure 127/70, pulse 72, temperature 97.8 F (36.6 C), temperature source Oral, resp. rate 18, height 5\' 11"  (1.803 m), weight 118.8 kg (262 lb), SpO2 97 %.Body mass index is 36.54 kg/m.  General Appearance: Well Groomed  Eye Contact:  Fair  Speech:  Clear and Coherent  Volume:  Normal  Mood:  Irritable  Affect:  Congruent  Thought Process:  Linear  Orientation:  Full (Time, Place, and Person)  Thought Content:  Delusions, Hallucinations: Auditory and Paranoid Ideation  Suicidal Thoughts:  No  Homicidal Thoughts:  No  Memory:  Immediate;   Fair Recent;   Fair Remote;   Fair  Judgement:  Poor  Insight:  Shallow  Psychomotor Activity:  Normal  Concentration:  Concentration: Fair and Attention Span: Fair  Recall:  [employer] of Knowledge:  Fair  Language:  Fair  Akathisia:  No  Handed:    AIMS (if indicated):     Assets:  Manufacturing systems engineer Physical Health  ADL's:  Intact  Cognition:  WNL  Sleep:  Number of Hours: 5.5     Treatment Plan Summary:  Kenneth [name] has a history of schizophrenia admitted floridly psychotic in the context of treatment  noncompliance.  1. Schizophrenia. We discontinued olanzapine as the patient refuses and continue Trilafon 8 mg 3 times a day. The patient was treated with Clozaril at [facility] earlier this year and he did very well. This treatment is no longer an option as he is homeless and uninsured.  2. Antisocial personality. There is history of violence. He had been been incarcerated for kidnaping and recently for assault.  3. H/O Malingering. Dr. Ardyth Harps felt that the patient might be malingering as in the past reported SI and psychosis even when stable in order to seek admission as he  has multiple social stressors. There were 6 ER visits over the last 6 months.  4. Prediabetes. HbA1c is 5.8. He refuses Metformin.  5. Agitation. Resolved.   6. Metabolic syndrome monitoring. Lipid panel and TSH are normal.  7. EKG. Normal sinus rhythm. QT 350.  8. Disposition. TBE. He will likely be discharged to the homeless shelter. We will attempt to connect him to ACT team.    Kristine Linea, MD 02/01/2016, 5:40 PM

## 2016-02-01 NOTE — [facility] Group Notes (Signed)
[facility] Group Notes:  (Nursing/MHT/Case Management/Adjunct)  Date:  02/01/2016  Time:  4:07 AM  Type of Therapy:  Psychoeducational Skills  Participation Level:  Active  Participation Quality:  Attentive  Affect:  Appropriate  Cognitive:  Appropriate  Insight:  Appropriate and Limited  Engagement in Group:  Limited  Modes of Intervention:  Socialization and Support  Summary of Progress/Problems:  Kenneth [name] 02/01/2016, 4:07 AM

## 2016-02-01 NOTE — Progress Notes (Signed)
D: Patient has minimal interaction with staff and peers. Flat affect. Very delusional and states he's fighting off the luminati. He denies SI/HI/AVH. Went to group and ate snack.  A: No HS medication scheduled. Encouragement provided.  R: Patient has been calm and cooperative. Safety maintained with 15 min checks.

## 2016-02-01 NOTE — [facility] Group Notes (Signed)
[facility] LCSW Group Therapy   02/01/2016 9:30 AM   Type of Therapy: Group Therapy   Participation Level: Pt invited but did not attend.  Participation Quality: Pt invited but did not attend.  Hampton AbbotKadijah Rabia Argote, MSW, LCSWA 02/01/2016, 10:47AM

## 2016-02-01 NOTE — Plan of Care (Signed)
Problem: Coping: Goal: Ability to verbalize frustrations and anger appropriately will improve Outcome: Not Progressing Continues to be irritable at times and delusional.

## 2016-02-01 NOTE — Progress Notes (Signed)
Recreation Therapy Notes  Date: 09.22.17 Time: 1:00 pm Location: Craft Room  Group Topic: Self-expression/Coping skills  Goal Area(s) Addresses:  Patient will effectively use art as a means of self-expression. Patient will recognize positive benefit of self-expression. Patient will be able to identify one emotion experienced during group session. Patient will identify use of art as a coping skill.  Behavioral Response: Did not attend  Intervention: Two Faces of Me  Activity: Patients were given a blank face worksheet and instructed to draw a line down the middle. On one side of the face, they were instructed to draw or write how they felt when they were admitted to the hospital. On the other side, they were instructed to draw or write how they want to feel when they are d/c from the hospital.  Education: LRT educated group on other forms of self-expression.  Education Outcome: Patient did not attend group.   Clinical Observations/Feedback: Patient did not attend group.  Emerie Vanderkolk M, LRT/CTRS 02/01/2016 3:02 PM 

## 2016-02-01 NOTE — Plan of Care (Signed)
Problem: Safety: Goal: Ability to remain free from injury will improve Outcome: Progressing Patient has remained free from injury during this shift.   

## 2016-02-01 NOTE — Tx Team (Signed)
Interdisciplinary Treatment and Diagnostic Plan Update  02/01/2016 Time of Session: 11:00am GRIFFON HERBERG MRN: [id number]  Principal Diagnosis: Schizophrenia, paranoid [facility])  Secondary Diagnoses: Principal Problem:   Schizophrenia, paranoid (HCC) Active Problems:   Antisocial personality disorder   Malingering   Noncompliance   Current Medications:  Current Facility-Administered Medications  Medication Dose Route Frequency Provider Last Rate Last Dose  . acetaminophen (TYLENOL) tablet 650 mg  650 mg Oral Q6H PRN Audery Amel, MD      . alum & mag hydroxide-simeth (MAALOX/MYLANTA) 200-200-20 MG/5ML suspension 30 mL  30 mL Oral Q4H PRN Audery Amel, MD      . LORazepam (ATIVAN) tablet 2 mg  2 mg Oral Q6H PRN Jimmy Footman, MD       Or  . LORazepam (ATIVAN) injection 2 mg  2 mg Intramuscular Q6H PRN Jimmy Footman, MD      . magnesium hydroxide (MILK OF MAGNESIA) suspension 30 mL  30 mL Oral Daily PRN Audery Amel, MD      . perphenazine (TRILAFON) tablet 8 mg  8 mg Oral TID Jimmy Footman, MD   8 mg at 02/01/16 0827   PTA Medications: Prescriptions Prior to Admission  Medication Sig Dispense Refill Last Dose  . OLANZapine (ZYPREXA) 10 MG tablet Take 3 tablets (30 mg total) by mouth at bedtime. 90 tablet 0 Past Week at Unknown time  . OLANZapine (ZYPREXA) 15 MG tablet Take 2 tablets (30 mg total) by mouth at bedtime. 30 tablet 0   . OLANZapine (ZYPREXA) 15 MG tablet Take 2 tablets (30 mg total) by mouth at bedtime. 14 tablet 0     Treatment Modalities: Medication Management, Group therapy, Case management,  1 to 1 session with clinician, Psychoeducation, Recreational therapy.   Physician Treatment Plan for Primary Diagnosis: Schizophrenia, paranoid (HCC) Long Term Goal(s): Improvement in symptoms so as ready for discharge   Short Term Goals: Ability to verbalize feelings will improve, Ability to demonstrate self-control will improve,  Ability to identify and develop effective coping behaviors will improve, Compliance with prescribed medications will improve and Ability to identify triggers associated with substance abuse/mental health issues will improve  Medication Management: Evaluate patient's response, side effects, and tolerance of medication regimen.  Therapeutic Interventions: 1 to 1 sessions, Unit Group sessions and Medication administration.  Evaluation of Outcomes: Progressing  Physician Treatment Plan for Secondary Diagnosis: Principal Problem:   Schizophrenia, paranoid (HCC) Active Problems:   Antisocial personality disorder   Malingering   Noncompliance  Long Term Goal(s): Improvement in symptoms so as ready for discharge  Short Term Goals: Ability to identify changes in lifestyle to reduce recurrence of condition will improve and Ability to identify triggers associated with substance abuse/mental health issues will improve  Medication Management: Evaluate patient's response, side effects, and tolerance of medication regimen.  Therapeutic Interventions: 1 to 1 sessions, Unit Group sessions and Medication administration.  Evaluation of Outcomes: Progressing   RN Treatment Plan for Primary Diagnosis: Schizophrenia, paranoid (HCC) Long Term Goal(s): Knowledge of disease and therapeutic regimen to maintain health will improve  Short Term Goals: Ability to demonstrate self-control, Ability to identify and develop effective coping behaviors will improve and Compliance with prescribed medications will improve  Medication Management: RN will administer medications as ordered by provider, will assess and evaluate patient's response and provide education to patient for prescribed medication. RN will report any adverse and/or side effects to prescribing provider.  Therapeutic Interventions: 1 on 1 counseling sessions, Psychoeducation, Medication administration,  Evaluate responses to treatment, Monitor vital  signs and CBGs as ordered, Perform/monitor CIWA, COWS, AIMS and Fall Risk screenings as ordered, Perform wound care treatments as ordered.  Evaluation of Outcomes: Progressing   LCSW Treatment Plan for Primary Diagnosis: Schizophrenia, paranoid (HCC) Long Term Goal(s): Safe transition to appropriate next level of care at discharge, Engage patient in therapeutic group addressing interpersonal concerns.  Short Term Goals: Engage patient in aftercare planning with referrals and resources, Increase social support, Identify triggers associated with mental health/substance abuse issues and Increase skills for wellness and recovery  Therapeutic Interventions: Assess for all discharge needs, 1 to 1 time with Social worker, Explore available resources and support systems, Assess for adequacy in community support network, Educate family and significant other(s) on suicide prevention, Complete Psychosocial Assessment, Interpersonal group therapy.  Evaluation of Outcomes: Progressing   Progress in Treatment: Attending groups: No. Participating in groups: No. Taking medication as prescribed: Yes. Toleration medication: Yes. Family/Significant other contact made: Yes, individual(s) contacted:  stepmother Patient understands diagnosis: Yes. Discussing patient identified problems/goals with staff: Yes. Medical problems stabilized or resolved: Yes. Denies suicidal/homicidal ideation: Yes. Issues/concerns per patient self-inventory: No. Other: n/a  New problem(s) identified: None identified at this time.  New Short Term/Long Term Goal(s): None identified at this time.  Discharge Plan or Barriers: [facility] and have follow-up with provider in that area. Patient has disability screening scheduled for 02/07/2016. CSW [facility] [place].  Reason for Continuation of Hospitalization: Anxiety Delusions  Medication stabilization  Estimated  Length of Stay: 3 to 5 days.  Attendees: Patient: Kenneth MasseBenjamin M Pottinger 02/01/2016 12:06 PM  Physician: Dr. Jennet MaduroPucilowska, MD 02/01/2016 12:06 PM  Nursing: Leonia ReaderPhyllis Cobb, RN 02/01/2016 12:06 PM  RN Care Manager: 02/01/2016 12:06 PM  Social Worker: Fredrich BirksAmaris G. Garnette CzechSampson MSW, LCSWA 02/01/2016 12:06 PM  Recreational Therapist: Jacquelynn CreeElizabeth M. Greene, LRT/CTRS 02/01/2016 12:06 PM  Other:  02/01/2016 12:06 PM  Other:  02/01/2016 12:06 PM  Other: 02/01/2016 12:06 PM    Scribe for Treatment Team: Fredrich BirksAmaris G. Garnette CzechSampson MSW, [facility] 02/01/2016 1:59 PM

## 2016-02-01 NOTE — [facility] Group Notes (Signed)
[facility] Group Notes:  (Nursing/MHT/Case Management/Adjunct)  Date:  02/01/2016  Time:  4:25 PM  Type of Therapy:  Psychoeducational Skills  Participation Level:  Active  Participation Quality:  Appropriate, Attentive and Supportive  Affect:  Appropriate  Cognitive:  Appropriate  Insight:  Appropriate  Engagement in Group:  Engaged and Supportive  Modes of Intervention:  Discussion and Education  Summary of Progress/Problems:  Kenneth [name] 02/01/2016, 4:25 PM

## 2016-02-01 NOTE — Progress Notes (Signed)
Patient is irritable at times & reluctant to answer questions.When called his name patient states "that is not my name."Denies suicidal or homicidal ideations and AV hallucinations.Did not attend all the groups.Compliant with medications.

## 2016-02-02 NOTE — Plan of Care (Signed)
Problem: Pain Managment: Goal: General experience of comfort will improve Outcome: Progressing Patient denies pain during this shift.

## 2016-02-02 NOTE — Progress Notes (Signed)
Pt alert, awake, up on unit today. Interacting in the dayroom with peers stating inappropriate things regarding "wooden dildos, kids, and vampire blood." Continues to talk about the "allumanti" and voices he feels some peers and staff are members. When asked if he is having thoughts to harm self or others today, pt reports "I don't think we need to be talking about that right now." Appears to be very suspicious. Voices concern regarding his feet. On exam, his toenails appear to be discolored, thick. MD informed. Medication compliant. Safety maintained.  Support and encouragement provided. Medications administered as ordered. Every 15 minute checks for safety. Will continue to monitor.

## 2016-02-02 NOTE — [facility] Group Notes (Signed)
[facility] Group Notes:  (Nursing/MHT/Case Management/Adjunct)  Date:  02/02/2016  Time:  12:26 AM  Type of Therapy:  Evening Wrap-up Group  Participation Level:  Active  Participation Quality:  Appropriate, Attentive and Supportive  Affect:  Appropriate  Cognitive:  Alert and Appropriate  Insight:  Appropriate, Good and Improving  Engagement in Group:  Developing/Improving and Engaged  Modes of Intervention:  Activity and Discussion  Summary of Progress/Problems:  Tomasita MorrowChelsea Nanta Cyndie Woodbeck 02/02/2016, 12:26 AM

## 2016-02-02 NOTE — Plan of Care (Signed)
Problem: Coping: Goal: Ability to interact with others will improve Outcome: Progressing Pt observed in the dayroom interacting with peers/staff more today than others, however his topics of discussion are inappropriate and include "wooden dildos, kids, and vampire blood." Pt redirected. Support and encouragement provided. Will continue to monitor.

## 2016-02-02 NOTE — [facility] Group Notes (Signed)
[facility] LCSW Group Therapy  02/02/2016 2:07 PM  Type of Therapy:  Group Therapy  Participation Level:  Patient did not attend group. CSW invited patient to group.   Summary of Progress/Problems: Communications: Patients identify how individuals communicate with one another appropriately and inappropriately. Patients will be guided to discuss their thoughts, feelings, and behaviors related to barriers when communicating. The group will process together ways to execute positive and appropriate communications.   Panfilo Ketchum G. Garnette CzechSampson MSW, LCSWA 02/02/2016, 2:15 PM

## 2016-02-02 NOTE — Progress Notes (Signed)
D: Observed pt in room lying in bed. Patient alert and oriented x4. Patient denies SI. Patient denied HI currently but says "it comes a goes" and denied HI towards anyone on the unit. Pt endorses AH, and when asked to elaborated stated "I can't really tell you." Pt affect is flat and irritable. Pt asked writer multiple times this evening to look up information on "[employer]" stating it was some sort of god. Pt stated his day was "so-so" but would not elaborate. Pt endorsed going to only 2 groups because "I don't like groups...they don't help me." Pt stated "I'm not depressed" but rated anxiety 8/10. When asked about anxiety pt stated "I got a real bad habit of getting mad." Pt is guarded and suspicious and forwards little. A: Offered active listening and support. Provided therapeutic communication. Encouraged pt to attend group and actively participate in care.Encouraged pt to share thoughts and feelings with staff. Reinforced the importance of safety on the unit, and advised pt to inform staff if he's having any thoughts of hurting others. R: Pt cooperative but irritable at times. Pt reiterated he was not having thoughts of hurting anyone right now. Pt had no medications due this evening. Will continue Q15 min. checks. Safety maintained.

## 2016-02-02 NOTE — Progress Notes (Signed)
[facility] MD Progress Note  02/02/2016 4:49 PM Kenneth [name]  MRN:  [id number]  Subjective: [name] has a history of schizophrenia and great violence. He spent 17 years in prison for abduction of the Child psychotherapist. He was hospitalized here 2 years ago following release from jail. He briefly stayed at the group home. He now is homeless and has no income or health insurance. He has appointment with a disability of his psychiatrist on Thursday. He was hospitalized at [facility] and responded well to clozapine. This option is no longer available due to lack of resources. In the past the patient did well on Trilafon and insists to continue. Dr. Ardyth Harps tried to put him on Zyprexa at the patient refused. I do remember that he did well on Trilafon 2 years ago.  The patient's thought processes remained disorganized and he is saying inappropriate comments in groups and in the day room such as "wooden dildos, vampire blood and kids". He remains suspicious of this Clinical research associate and was resistant to give his name, Social Security number or any identifying information. The patient adamantly denies any suicidal thoughts, auditory or visual hallucinations. He has very poor insight and judgment poor. The patient has not been attending groups on the unit. No agitation and he has been compliant with Trilafon. He continues to refuse any other psychotropic medications were offered for psychotropic medications. He refuses to take metformin as well.   Principal Problem: Schizophrenia, paranoid (HCC) Diagnosis:   Patient Active Problem List   Diagnosis Date Noted  . Schizophrenia, paranoid (HCC) [F20.0] 01/26/2016  . Antisocial personality disorder [F60.2] 01/23/2016  . Malingering [Z76.5] 01/23/2016  . Noncompliance [Z91.19] 01/23/2016   Total Time spent with patient: 30 minutes  Past Medical History:  Past Medical History:  Diagnosis Date  . Anxiety   . Hypercholesteremia   . Hypertension   . Schizophrenia [facility])      Past Surgical History:  Procedure Laterality Date  . CIRCUMCISION, NON-NEWBORN     Family History: History reviewed. No pertinent family history.    Social History:  History  Alcohol Use  . Yes    Comment: social drinker     History  Drug Use No    Social History   Social History  . Marital status: Legally Separated    Spouse name: N/A  . Number of children: N/A  . Years of education: N/A   Social History Main Topics  . Smoking status: Current Every Day Smoker    Packs/day: 2.00    Types: Cigarettes  . Smokeless tobacco: Never Used  . Alcohol use Yes     Comment: social drinker  . Drug use: No  . Sexual activity: Not Asked   Other Topics Concern  . None   Social History Narrative  . None    Current Medications: Current Facility-Administered Medications  Medication Dose Route Frequency Provider Last Rate Last Dose  . acetaminophen (TYLENOL) tablet 650 mg  650 mg Oral Q6H PRN Audery Amel, MD      . alum & mag hydroxide-simeth (MAALOX/MYLANTA) 200-200-20 MG/5ML suspension 30 mL  30 mL Oral Q4H PRN Audery Amel, MD      . magnesium hydroxide (MILK OF MAGNESIA) suspension 30 mL  30 mL Oral Daily PRN Audery Amel, MD      . perphenazine (TRILAFON) tablet 8 mg  8 mg Oral TID Jimmy Footman, MD   8 mg at 02/02/16 1147    Lab Results:  No results found  for this or any previous visit (from the past 48 hour(s)).  Blood Alcohol level:  Lab Results  Component Value Date   ETH <5 01/26/2016   ETH <5 01/23/2016    Metabolic Disorder Labs: Lab Results  Component Value Date   HGBA1C 5.8 (H) 01/27/2016   MPG 120 01/27/2016   Lab Results  Component Value Date   PROLACTIN 9.6 01/27/2016   Lab Results  Component Value Date   CHOL 199 01/27/2016   TRIG 118 01/27/2016   HDL 36 (L) 01/27/2016   CHOLHDL 5.5 01/27/2016   VLDL 24 01/27/2016   LDLCALC 139 (H) 01/27/2016    Musculoskeletal: Strength & Muscle Tone: within normal limits Gait &  Station: normal Patient leans: N/A  Psychiatric Specialty Exam: Physical Exam  Nursing note and vitals reviewed.   Review of Systems  Constitutional: Negative.  Negative for chills, diaphoresis, fever, malaise/fatigue and weight loss.  HENT: Negative.  Negative for congestion, ear discharge, ear pain, hearing loss, nosebleeds, sore throat and tinnitus.   Eyes: Negative.  Negative for blurred vision, double vision, photophobia, pain, discharge and redness.  Respiratory: Negative.  Negative for cough, hemoptysis, sputum production, shortness of breath and wheezing.   Cardiovascular: Negative.  Negative for chest pain, palpitations, orthopnea, claudication and leg swelling.  Gastrointestinal: Negative.  Negative for abdominal pain, constipation, diarrhea, heartburn, nausea and vomiting.  Genitourinary: Negative.  Negative for dysuria, flank pain, frequency, hematuria and urgency.  Musculoskeletal: Negative.  Negative for back pain, falls, joint pain, myalgias and neck pain.  Skin: Negative.  Negative for itching and rash.  Neurological: Negative.  Negative for dizziness, tingling, tremors, sensory change, speech change, focal weakness, seizures, weakness and headaches.  Endo/Heme/Allergies: Negative for environmental allergies. Does not bruise/bleed easily.  Psychiatric/Behavioral: Positive for depression and hallucinations. Negative for substance abuse and suicidal ideas. The patient is not nervous/anxious and does not have insomnia.   All other systems reviewed and are negative.   Blood pressure 134/77, pulse 64, temperature 97.5 F (36.4 C), resp. rate 18, height 5\' 11"  (1.803 m), weight 118.8 kg (262 lb), SpO2 97 %.Body mass index is 36.54 kg/m.  General Appearance: Well Groomed  Eye Contact:  Fair  Speech:  Clear and Coherent  Volume:  Normal  Mood:  "OK"  Affect:  Blunt  Thought Process:  Linear  Orientation:  Full (Time, Place, and Person)  Thought Content:  Delusions,  Hallucinations: Auditory and Paranoid Ideation  Suicidal Thoughts:  No  Homicidal Thoughts:  No  Memory:  Immediate;   Fair Recent;   Fair Remote;   Fair  Judgement:  Poor  Insight:  Shallow  Psychomotor Activity:  Normal  Concentration:  Concentration: Fair and Attention Span: Fair  Recall:  [employer] of Knowledge:  Fair  Language:  Fair  Akathisia:  No  Handed:    AIMS (if indicated):     Assets:  Manufacturing systems engineer Physical Health  ADL's:  Intact  Cognition:  WNL  Sleep:  Number of Hours: 6.75     Treatment Plan Summary:  Mr. Moquin has a history of schizophrenia admitted floridly psychotic in the context of treatment noncompliance.  1. Schizophrenia: Zyprexa was discontinued as he was unwilling to take the medications. He is willing to take Trilafon 8 mg 3 times a day. The patient was treated with Clozaril at [facility] earlier this year and he did very well. This treatment is no longer an option as he is homeless and uninsured.  2. Antisocial personality.  There is history of violence. He had been been incarcerated for kidnaping and recently for assault.  3. H/O Malingering. Dr. Ardyth HarpsHernandez felt that the patient might be malingering as in the past reported SI and psychosis even when stable in order to seek admission as he has multiple social stressors. There were 6 ER visits over the last 6 months.  4. Prediabetes. HbA1c is 5.8. He refuses Metformin.  5. Agitation. Resolved. No recent behavioral disturbances on the unit.  6. Metabolic syndrome monitoring. Lipid panel and TSH are normal.  7. EKG. Normal sinus rhythm. QT 350.  8. Disposition. TBD. He will likely be discharged to the homeless shelter. We will attempt to connect him to ACT team for psychotropic medication management.    Levora AngelKAPUR,Derren Suydam KAMAL, MD 02/02/2016, 4:49 PM

## 2016-02-02 NOTE — Plan of Care (Signed)
Problem: Coping: Goal: Ability to verbalize frustrations and anger appropriately will improve Outcome: Progressing Pt remains irritable at times, but has not acted out or been aggressive.

## 2016-02-02 NOTE — Progress Notes (Signed)
D: This Clinical research associatewriter had patient on Thursday and he was very suspicious. He is less suspicious and was waving to this writer during the evening and made conversation during assessment. Patient did not make mention of the Illuminati like previously. Still appears disorganized and flat. Denies SI/HI. Patient attended group. A: No HS medications given. Encouragement provided.  R: Patient has been calm and cooperative. Safety maintained with 15 min checks.

## 2016-02-03 NOTE — [facility] Group Notes (Signed)
[facility] LCSW Group Therapy  02/03/2016 2:01 PM  Type of Therapy:  Group Therapy  Participation Level:  Patient did not attend group. CSW invited patient to group.   Summary of Progress/Problems:Self esteem: Patients discussed self esteem and how it impacts them. They discussed what aspects in their lives has influenced their self esteem. They were challenged to identify changes that are needed in order to improve self esteem. Patients participated in activity where they had to identify positive adjectives they felt described their personality. Patients shared with the group on the following areas: Things I am good at, What I like about my appearance, I've helped others by, What I value the most, compliments I have received, challenges I have overcome, thing that make me unique, and Times I've made others happy.   Skyelar Swigart G. Garnette CzechSampson MSW, LCSWA 02/03/2016, 2:02 PM

## 2016-02-03 NOTE — [facility] Group Notes (Signed)
[facility] Group Notes:  (Nursing/MHT/Case Management/Adjunct)  Date:  02/03/2016  Time:  4:06 AM  Type of Therapy:  Psychoeducational Skills  Participation Level:  Active  Participation Quality:  Appropriate  Affect:  Appropriate  Cognitive:  Appropriate  Insight:  Appropriate  Engagement in Group:  Engaged  Modes of Intervention:  Discussion, Socialization and Support  Summary of Progress/Problems:  Kenneth MilroyLaquanda Y Lyris [name] 02/03/2016, 4:06 AM

## 2016-02-03 NOTE — Progress Notes (Signed)
Pt awake and alert today. Refuses to attend groups. Less suspicious of staff though he remains guarded with assessment. Again reports "I don't think we need to talk about that" when asked if he is having thoughts of hurting self or others. Reports "I do feel better though, and I'm about ready to get out of here." No inappropriate topics of conversation heard today per staff. Medication compliant. Safety maintained. Calm, cooperative with interactions.   Every 15 minute checks for safety. Medication administered as ordered. Support and encouragement provided. Encouraged to attend groups. Will continue to monitor.

## 2016-02-03 NOTE — Progress Notes (Signed)
[facility] MD Progress Note  02/03/2016 3:41 PM Kenneth [name]  MRN:  [id number]  Subjective: Kenneth [name] has a history of schizophrenia and great violence. He spent 17 years in prison for abduction of the Child psychotherapist. He was hospitalized here 2 years ago following release from jail. He briefly stayed at the group home. He now is homeless and has no income or health insurance. He has appointment with a disability of his psychiatrist on Thursday. He was hospitalized at [facility] and responded well to clozapine. This option is no longer available due to lack of resources. In the past the patient did well on Trilafon and insists to continue. Dr. Ardyth Harps tried to put him on Zyprexa at the patient refused. I do remember that he did well on Trilafon 2 years ago.  The patient's thought processes remained disorganized and he is saying inappropriate comments in groups and in the day room such as "wooden dildos, vampire blood and kids". He remains suspicious of this Clinical research associate and was resistant to give his name, Social Security number or any identifying information. The patient adamantly denies any suicidal thoughts, auditory or visual hallucinations. He has very poor insight and judgment poor. The patient has not been attending groups on the unit. No agitation and he has been compliant with Trilafon. He continues to refuse any other psychotropic medications were offered for psychotropic medications. He refuses to take metformin as well.   Principal Problem: Schizophrenia, paranoid (HCC) Diagnosis:   Patient Active Problem List   Diagnosis Date Noted  . Schizophrenia, paranoid (HCC) [F20.0] 01/26/2016  . Antisocial personality disorder [F60.2] 01/23/2016  . Malingering [Z76.5] 01/23/2016  . Noncompliance [Z91.19] 01/23/2016   Total Time spent with patient: 30 minutes  Past Medical History:  Past Medical History:  Diagnosis Date  . Anxiety   . Hypercholesteremia   . Hypertension   . Schizophrenia [facility])      Past Surgical History:  Procedure Laterality Date  . CIRCUMCISION, NON-NEWBORN     Family History: History reviewed. No pertinent family history.    Social History:  History  Alcohol Use  . Yes    Comment: social drinker     History  Drug Use No    Social History   Social History  . Marital status: Legally Separated    Spouse name: N/A  . Number of children: N/A  . Years of education: N/A   Social History Main Topics  . Smoking status: Current Every Day Smoker    Packs/day: 2.00    Types: Cigarettes  . Smokeless tobacco: Never Used  . Alcohol use Yes     Comment: social drinker  . Drug use: No  . Sexual activity: Not Asked   Other Topics Concern  . None   Social History Narrative  . None    Current Medications: Current Facility-Administered Medications  Medication Dose Route Frequency Provider Last Rate Last Dose  . acetaminophen (TYLENOL) tablet 650 mg  650 mg Oral Q6H PRN Audery Amel, MD      . alum & mag hydroxide-simeth (MAALOX/MYLANTA) 200-200-20 MG/5ML suspension 30 mL  30 mL Oral Q4H PRN Audery Amel, MD      . magnesium hydroxide (MILK OF MAGNESIA) suspension 30 mL  30 mL Oral Daily PRN Audery Amel, MD      . perphenazine (TRILAFON) tablet 8 mg  8 mg Oral TID Jimmy Footman, MD   8 mg at 02/03/16 1141    Lab Results:  No results found  for this or any previous visit (from the past 48 hour(s)).  Blood Alcohol level:  Lab Results  Component Value Date   ETH <5 01/26/2016   ETH <5 01/23/2016    Metabolic Disorder Labs: Lab Results  Component Value Date   HGBA1C 5.8 (H) 01/27/2016   MPG 120 01/27/2016   Lab Results  Component Value Date   PROLACTIN 9.6 01/27/2016   Lab Results  Component Value Date   CHOL 199 01/27/2016   TRIG 118 01/27/2016   HDL 36 (L) 01/27/2016   CHOLHDL 5.5 01/27/2016   VLDL 24 01/27/2016   LDLCALC 139 (H) 01/27/2016    Musculoskeletal: Strength & Muscle Tone: within normal limits Gait &  Station: normal Patient leans: N/A  Psychiatric Specialty Exam: Physical Exam  Nursing note and vitals reviewed.   Review of Systems  Constitutional: Negative.  Negative for chills, diaphoresis, fever, malaise/fatigue and weight loss.  HENT: Negative.  Negative for congestion, ear discharge, ear pain, hearing loss, nosebleeds, sore throat and tinnitus.   Eyes: Negative.  Negative for blurred vision, double vision, photophobia, pain, discharge and redness.  Respiratory: Negative.  Negative for cough, hemoptysis, sputum production, shortness of breath and wheezing.   Cardiovascular: Negative.  Negative for chest pain, palpitations, orthopnea, claudication and leg swelling.  Gastrointestinal: Negative.  Negative for abdominal pain, constipation, diarrhea, heartburn, nausea and vomiting.  Genitourinary: Negative.  Negative for dysuria, flank pain, frequency, hematuria and urgency.  Musculoskeletal: Negative.  Negative for back pain, falls, joint pain, myalgias and neck pain.  Skin: Negative.  Negative for itching and rash.  Neurological: Negative.  Negative for dizziness, tingling, tremors, sensory change, speech change, focal weakness, seizures, weakness and headaches.  Endo/Heme/Allergies: Negative for environmental allergies. Does not bruise/bleed easily.  Psychiatric/Behavioral: Positive for depression and hallucinations. Negative for substance abuse and suicidal ideas. The patient is not nervous/anxious and does not have insomnia.   All other systems reviewed and are negative.   Blood pressure (!) 151/71, pulse 68, temperature 97.2 F (36.2 C), resp. rate 18, height 5\' 11"  (1.803 m), weight 118.8 kg (262 lb), SpO2 97 %.Body mass index is 36.54 kg/m.  General Appearance: Well Groomed  Eye Contact:  Fair  Speech:  Clear and Coherent  Volume:  Normal  Mood:  "OK"  Affect:  Blunt  Thought Process:  Linear  Orientation:  Full (Time, Place, and Person)  Thought Content:  Delusions,  Hallucinations: Auditory and Paranoid Ideation  Suicidal Thoughts:  No  Homicidal Thoughts:  No  Memory:  Immediate;   Fair Recent;   Fair Remote;   Fair  Judgement:  Poor  Insight:  Shallow  Psychomotor Activity:  Normal  Concentration:  Concentration: Fair and Attention Span: Fair  Recall:  [employer] of Knowledge:  Fair  Language:  Fair  Akathisia:  No  Handed:    AIMS (if indicated):     Assets:  Manufacturing systems engineer Physical Health  ADL's:  Intact  Cognition:  WNL  Sleep:  Number of Hours: 5.25     Treatment Plan Summary:  Kenneth [name] has a history of schizophrenia admitted floridly psychotic in the context of treatment noncompliance.  1. Schizophrenia: Zyprexa was discontinued as he was unwilling to take the medications. He is willing to take Trilafon 8 mg 3 times a day. The patient was treated with Clozaril at [facility] earlier this year and he did very well. This treatment is no longer an option as he is homeless and uninsured.  2. Antisocial  personality. There is history of violence. He had been been incarcerated for kidnaping and recently for assault.  3. H/O Malingering. Dr. Ardyth HarpsHernandez felt that the patient might be malingering as in the past reported SI and psychosis even when stable in order to seek admission as he has multiple social stressors. There were 6 ER visits over the last 6 months.  4. Prediabetes. HbA1c is 5.8. He refuses Metformin.  5. Agitation. Resolved. No recent behavioral disturbances on the unit.  6. Metabolic syndrome monitoring. Lipid panel and TSH are normal.  7. EKG. Normal sinus rhythm. QT 350.  8. Disposition. TBD. He will likely be discharged to the homeless shelter. We will attempt to connect him to ACT team for psychotropic medication management.    Levora AngelKAPUR,AARTI KAMAL, MD 02/03/2016, 3:41 PM

## 2016-02-03 NOTE — Plan of Care (Signed)
Problem: Activity: Goal: Interest or engagement in activities will improve Outcome: Not Progressing Pt does not come out of room much today. Does not attend groups. Support and encouragement provided. Will continue to monitor.

## 2016-02-04 NOTE — Progress Notes (Signed)
Recreation Therapy Notes  Date: 09.25.17 Time: 1:00 pm Location: Craft Room  Group Topic: Wellness  Goal Area(s) Addresses:  Patient will identify at least one item per dimension of health. Patient will examine areas they are deficient in.  Behavioral Response: Did not attend  Intervention: 6 Dimensions of Health  Activity: Patients were definition sheets of the 6 dimensions of health and a worksheet with the dimensions on it. Patients were instructed to write 2-3 items in each category they were currently doing.  Education: LRT educated patients on ways to improve each dimension.  Education Outcome: Patient did not attend group.  Clinical Observations/Feedback: Patient did not attend group.  [name],Talecia Sherlin M, LRT/CTRS 02/04/2016 3:23 PM

## 2016-02-04 NOTE — [facility] Group Notes (Signed)
[facility] LCSW Group Therapy   02/04/2016 9:30 am Type of Therapy: Group Therapy   Participation Level: Pt invited but did not attend.   Participation Quality: Pt invited but did not attend.   Hampton AbbotKadijah Clotilda Hafer, MSW, LCSWA 02/04/2016, 11:00AM

## 2016-02-04 NOTE — Progress Notes (Signed)
D: Pt denies SI/HI/AVH. Pt is irritable towards staff, and unwilling to participate or cooperative in treatment plan. Pt  appears less anxious and he is interacting with peers  appropriately.  A: Pt was offered support and encouragement. Pt was given scheduled medications. Pt was encouraged to attend groups. Q 15 minute checks were done for safety.  R:Pt did not attend group. Pt has no complaints. Pt is mnoreceptive to treatment and safety maintained on unit.

## 2016-02-04 NOTE — Progress Notes (Signed)
D: Patient still paranoid and somewhat irritable. States he doesn't understand why we have to keep asking him questions. Denies SI/HI. Patient attended group and ate snack. A: No HS medications given. Encouragement provided.  R: Patient has been calm and cooperative. Safety maintained with 15 min checks.

## 2016-02-04 NOTE — Plan of Care (Signed)
Problem: Activity: Goal: Sleeping patterns will improve Outcome: Not Progressing Reports poor sleep last night because "the illuminati's were tripping me up." Support and encouragement provided.
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
## 2016-02-04 NOTE — Progress Notes (Signed)
On initial assessment this morning, pt states "I been up all damn night. I didn't sleep because the illuminati, they're tying to trip me up." Pt calm and cooperative, and reports he doesn't think I'm part of the "illuminati." Medication complaint. Safety maintained.   Support and encouragement provided. Safety maintained with every 15 minute rounds. Medications administered as ordered. Will continue to monitor.

## 2016-02-04 NOTE — Progress Notes (Signed)
Pt focused on discharge and talking to the doctor most of the day. Reports he thinks "the staff have been writing in my chart that I'm part of the illuminati, and I'm not part of such!" Support and encouragement provided. Continues to be suspicious. Is not as inappropriate with his topics of conversation as he was. Safety maintained. Medication compliant.   Safety maintained with every 15 minute checks. Medications administered as ordered. Will continue to monitor.

## 2016-02-05 ENCOUNTER — Encounter: Payer: Self-pay | Admitting: Psychiatry

## 2016-02-05 DIAGNOSIS — F17200 Nicotine dependence, unspecified, uncomplicated: Secondary | ICD-10-CM | POA: Diagnosis present

## 2016-02-05 MED ORDER — PERPHENAZINE 8 MG PO TABS: 8.0000 mg | ORAL_TABLET | Freq: Three times a day (TID) | ORAL | 3 refills | Status: DC

## 2016-02-05 NOTE — [facility] Group Notes (Signed)
[facility] LCSW Group Therapy   02/05/2016 1pm  Type of Therapy: Group Therapy   Participation Level: Pt invited but did not attend.  Participation Quality: Pt invited but did not attend.    Hampton AbbotKadijah Roxine Whittinghill, MSW, LCSWA 02/05/2016, 2:19PM

## 2016-02-05 NOTE — Plan of Care (Signed)
Problem: Physical Regulation: Goal: Will remain free from infection Outcome: Progressing Encourage good hand  Washing, patient progressing

## 2016-02-05 NOTE — Progress Notes (Signed)
Recreation Therapy Notes  Date: 09.26.17 Time: 9:30 am Location: Craft Room  Group Topic: Self-expression  Goal Area(s) Addresses:  Patient will be able to identify a color that represents each emotion. Patient will verbalize benefit of using art as a means of self-expression. Patient will verbalize one emotion experienced while participating in activity.  Behavioral Response: Did not attend  Intervention: The Colors Within Me  Activity: Patients were given a blank face worksheet and were instructed to pick a color for each emotion they were feeling and show on the worksheet how much of that emotion they were feeling.  Education: LRT educated patients on different forms of self-expression.  Education Outcome: Patient did not attend group.  Clinical Observations/Feedback: Patient did not attend group.  [name],Carren Blakley M, LRT/CTRS 02/05/2016 10:09 AM

## 2016-02-05 NOTE — Progress Notes (Signed)
Patient ID: Kenneth MasseBenjamin M Maclaughlin, male   DOB: 31-Jul-1965, 50 y.o.   MRN: [id number]  [facility] Staff SW sent requested referral info and they agreed to call back to notify if Pt approved.  CSW was told would be notified ASAP.  CSW prepared PT chart for D/c follow up appt at [facility] and [facility] Psych eval on Thursday at 10am documented.  Pt [place] 6[place] [facility] [place] [facility].  CSW attempted to notify Mother Jenell MillinerFaye Carter of D/C with no answer so left a voicemail.  CSW communicated plan with [facility] Hulan AmatoGwen Farrish who agreed to pass on to 3rd shift RN.  Jake SharkSara Magenta Schmiesing, LCSW

## 2016-02-05 NOTE — Progress Notes (Signed)
D: Patient stated slept good last night .Stated appetite is good and energy level  Is normal. Stated concentration is good . Stated on Depression scale 0 , hopeless  0 and anxiety0 .( low 0-10 high) Denies suicidal  homicidal ideations  .  No auditory hallucinations  No pain concerns . Appropriate ADL'S. Interacting with peers and staff.  A: Encourage patient participation with unit programming . Instruction  Given on  Medication , verbalize understanding. R: Voice no other concerns. Staff continue to monitor  

## 2016-02-05 NOTE — [facility] Suicide Risk Assessment (Signed)
[facility]   Principal Problem: Schizophrenia, paranoid [facility](HCC) Discharge Diagnoses:  Patient Active Problem List   Diagnosis Date Noted  . Tobacco use disorder [F17.200] 02/05/2016  . Schizophrenia, paranoid (HCC) [F20.0] 01/26/2016  . Antisocial personality disorder [F60.2] 01/23/2016  . Noncompliance [Z91.19] 01/23/2016    Total Time spent with patient: 30 minutes  Musculoskeletal: Strength & Muscle Tone: within normal limits Gait & Station: normal Patient leans: N/A  Psychiatric Specialty Exam: Review of Systems  All other systems reviewed and are negative.   Blood pressure (!) 149/85, pulse 67, temperature 98 F (36.7 C), temperature source Oral, resp. rate 18, height 5\' 11"  (1.803 m), weight 118.8 kg (262 lb), SpO2 97 %.Body mass index is 36.54 kg/m.  General Appearance: Casual  Eye Contact::  Good  Speech:  Clear and Coherent409  Volume:  Normal  Mood:  Euthymic  Affect:  Appropriate  Thought Process:  Goal Directed  Orientation:  Full (Time, Place, and Person)  Thought Content:  Delusions and Paranoid Ideation  Suicidal Thoughts:  No  Homicidal Thoughts:  No  Memory:  Immediate;   Fair Recent;   Fair Remote;   Fair  Judgement:  Impaired  Insight:  Lacking  Psychomotor Activity:  Normal  Concentration:  Fair  Recall:  [employer]  Fund of Knowledge:Fair  Language: Fair  Akathisia:  No  Handed:  Right  AIMS (if indicated):     Assets:  Communication Skills Desire for Improvement Physical Health Resilience Social Support  Sleep:  Number of Hours: 5.25  Cognition: WNL  ADL's:  Intact   Mental Status Per Nursing Assessment::   On Admission:  Thoughts of violence towards others  Demographic Factors:  Male, Caucasian, Low socioeconomic status and Unemployed  Loss Factors: Financial problems/change in socioeconomic status  Historical Factors: Prior suicide attempts, Family history of mental illness or substance abuse and  Impulsivity  Risk Reduction Factors:   Sense of responsibility to family and Positive social support  Continued Clinical Symptoms:  Schizophrenia:   Paranoid or undifferentiated type  Cognitive Features That Contribute To Risk:  None    Suicide Risk:  Minimal: No identifiable suicidal ideation.  Patients presenting with no risk factors but with morbid ruminations; may be classified as minimal risk based on the severity of the depressive symptoms  Follow-up Information    [facility]. Go in 3 day(s).   Why:  Go to [facility] for medication management and outpatient therapy within 7 days of discharge. Walk-in hours for new patients are M,W,F 8a-3p. Please arrive early to ensure prompt service. Bring discharge summary, any medications, and photo ID. Contact information: [address] [place] [address] [phone]           Plan Of Care/Follow-up recommendations:  Activity:  As tolerated. Diet:  Low sodium heart healthy. Other:  Keep follow-up appointments.  Kristine LineaJolanta Baeleigh Devincent, MD 02/05/2016, 2:16 PM

## 2016-02-05 NOTE — Progress Notes (Signed)
  [facility] :  Will you be returning to the same living situation after discharge:  No.[facility] [place] At discharge, do you have transportation home?: Yes,  [facility] provided PART bus fare Do you have the ability to pay for your medications: No.Provided 14 day supply  Release of information consent forms completed and in the chart;  Patient's signature needed at discharge.  Patient to Follow up at: Follow-up Information    [facility]. Go on 02/18/2016.   Why:  1:00pm, This was first available appointment.  Please call and reschedule if unable to keep appointment.  Take all Hospital discharge paperwork with you to this appointment Contact information: [address] [place] [id number] [phone] FAX [phone]        Shiahna Dye PHD. Go on 02/07/2016.   Why:  10:00am, Psychiatric Evaluation Contact information: [facility] [address] [place] [address] [phone])          Next level of care provider has access [facility] Link:no  Safety Planning and Suicide Prevention discussed: Yes,     Have you used any form of tobacco in the last 30 days? (Cigarettes, Smokeless Tobacco, Cigars, and/or Pipes): Yes  Has patient been referred to the Quitline?: Patient refused referral  Patient has been referred for addiction treatment: Yes  Glennon MacSara P Misha Antonini, MSW, LCSW 02/05/2016, 4:48 PM

## 2016-02-05 NOTE — Tx Team (Signed)
Interdisciplinary Treatment and Diagnostic Plan Update  02/05/2016 Time of Session:10:30am Athena MasseBenjamin M Dement MRN: [id number]  Principal Diagnosis: Schizophrenia, paranoid [facility](HCC)  Secondary Diagnoses: Principal Problem:   Schizophrenia, paranoid (HCC) Active Problems:   Antisocial personality disorder   Noncompliance   Tobacco use disorder   Current Medications:  Current Facility-Administered Medications  Medication Dose Route Frequency Provider Last Rate Last Dose  . acetaminophen (TYLENOL) tablet 650 mg  650 mg Oral Q6H PRN Audery AmelJohn T Clapacs, MD      . alum & mag hydroxide-simeth (MAALOX/MYLANTA) 200-200-20 MG/5ML suspension 30 mL  30 mL Oral Q4H PRN Audery AmelJohn T Clapacs, MD      . magnesium hydroxide (MILK OF MAGNESIA) suspension 30 mL  30 mL Oral Daily PRN Audery AmelJohn T Clapacs, MD      . perphenazine (TRILAFON) tablet 8 mg  8 mg Oral TID Jimmy FootmanAndrea Hernandez-Gonzalez, MD   8 mg at 02/05/16 1222   PTA Medications: Prescriptions Prior to Admission  Medication Sig Dispense Refill Last Dose  . OLANZapine (ZYPREXA) 10 MG tablet Take 3 tablets (30 mg total) by mouth at bedtime. 90 tablet 0 Past Week at Unknown time  . OLANZapine (ZYPREXA) 15 MG tablet Take 2 tablets (30 mg total) by mouth at bedtime. 30 tablet 0   . OLANZapine (ZYPREXA) 15 MG tablet Take 2 tablets (30 mg total) by mouth at bedtime. 14 tablet 0     Patient Stressors:  Homeless, Mental Health, Criminal History  Patient Strengths:  Resilient, Resourceful  Treatment Modalities: Medication Management, Group therapy, Case management,  1 to 1 session with clinician, Psychoeducation, Recreational therapy.   Physician Treatment Plan for Primary Diagnosis: Schizophrenia, paranoid (HCC) Long Term Goal(s): Improvement in symptoms so as ready for discharge   Short Term Goals: Ability to verbalize feelings will improve, Ability to demonstrate self-control will improve, Ability to identify and develop effective coping behaviors will improve,  Compliance with prescribed medications will improve and Ability to identify triggers associated with substance abuse/mental health issues will improve  Medication Management: Evaluate patient's response, side effects, and tolerance of medication regimen.  Therapeutic Interventions: 1 to 1 sessions, Unit Group sessions and Medication administration.  Evaluation of Outcomes: Adequate for Discharge per MD  Physician Treatment Plan for Secondary Diagnosis: Principal Problem:   Schizophrenia, paranoid (HCC) Active Problems:   Antisocial personality disorder   Malingering   Noncompliance  Long Term Goal(s): Improvement in symptoms so as ready for discharge  Short Term Goals: Ability to identify changes in lifestyle to reduce recurrence of condition will improve and Ability to identify triggers associated with substance abuse/mental health issues will improve  Medication Management: Evaluate patient's response, side effects, and tolerance of medication regimen.  Therapeutic Interventions: 1 to 1 sessions, Unit Group sessions and Medication administration.  Evaluation of Outcomes: Adequate for Discharge per MD   RN Treatment Plan for Primary Diagnosis: Schizophrenia, paranoid (HCC) Long Term Goal(s): Knowledge of disease and therapeutic regimen to maintain health will improve  Short Term Goals: Ability to demonstrate self-control, Ability to identify and develop effective coping behaviors will improve and Compliance with prescribed medications will improve  Medication Management: RN will administer medications as ordered by provider, will assess and evaluate patient's response and provide education to patient for prescribed medication. RN will report any adverse and/or side effects to prescribing provider.  Therapeutic Interventions: 1 on 1 counseling sessions, Psychoeducation, Medication administration, Evaluate responses to treatment, Monitor vital signs and CBGs as ordered,  Perform/monitor CIWA, COWS, AIMS and Fall Risk  screenings as ordered, Perform wound care treatments as ordered.  Evaluation of Outcomes: Adequate for Discharge per MD   LCSW Treatment Plan for Primary Diagnosis: Schizophrenia, paranoid (HCC) Long Term Goal(s): Safe transition to appropriate next level of care at discharge, Engage patient in therapeutic group addressing interpersonal concerns.  Short Term Goals: Engage patient in aftercare planning with referrals and resources, Increase social support, Identify triggers associated with mental health/substance abuse issues and Increase skills for wellness and recovery  Therapeutic Interventions: Assess for all discharge needs, 1 to 1 time with Social worker, Explore available resources and support systems, Assess for adequacy in community support network, Educate family and significant other(s) on suicide prevention, Complete Psychosocial Assessment, Interpersonal group therapy.  Evaluation of Outcomes: Adequate for Discharge per MD   Progress in Treatment: Attending groups: No. Participating in groups: No. Taking medication as prescribed: Yes. Toleration medication: Yes. Family/Significant other contact made: Yes, individual(s) contacted:  stepmother Patient understands diagnosis: Yes. Discussing patient identified problems/goals with staff: Yes. Medical problems stabilized or resolved: Yes. Denies suicidal/homicidal ideation: Yes. Issues/concerns per patient self-inventory: No. Other: n/a  New problem(s) identified: None identified at this time.  New Short Term/Long Term Goal(s): None identified at this time.  Discharge Plan or Barriers: Patient will discharge to a [facility] and have follow-up with [facility] in that area. Patient has disability screening scheduled for 02/07/2016. CSW providing PART bus fare to assist patient with transportation to [facility].   Reason for Continuation of  Hospitalization: Anxiety Delusions  Medication stabilization Coordination of after-care plan  Estimated Length of Stay:1 day Attendees: Patient:Kenneth [name] 02/05/2016 5:00 PM  Physician: Braulio Conte [name] 02/05/2016 5:00 PM  Nursing: Hulan Amato, RN 02/05/2016 5:00 PM  RN Care Manager: Maurice March, RN 02/05/2016 5:00 PM  Social Worker: Jake Shark, LCSW 02/05/2016 5:00 PM  Recreational Therapist: Hershal Coria, LRT 02/05/2016 5:00 PM  Other:  02/05/2016 5:00 PM  Other:  02/05/2016 5:00 PM  Other: 02/05/2016 5:00 PM    Scribe for Treatment Team: Glennon Mac, LCSW 02/05/2016 5:00 PM

## 2016-02-05 NOTE — [facility] Group Notes (Signed)
[facility] Group Notes:  (Nursing/MHT/Case Management/Adjunct)  Date:  02/05/2016  Time:  1:28 AM  Type of Therapy:  Group Therapy  Participation Level:  Active  Participation Quality:  Appropriate  Affect:  Appropriate  Cognitive:  Appropriate  Insight:  Improving  Engagement in Group:  Developing/Improving  Modes of Intervention:  n/a  Summary of Progress/Problems:  Veva Holesshley Imani Floretta Petro 02/05/2016, 1:28 AM
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
## 2016-02-06 NOTE — Progress Notes (Signed)
D: Patient still paranoid and somewhat irritable. States he doesn't understand why we have to keep asking him questions. Denies SI/HI. Patient did not attend group. Asked questions about discharge.  A: No HS medications given. Encouragement provided.  R: Patient has been calm and cooperative. Safety maintained with 15 min checks

## 2016-02-06 NOTE — Progress Notes (Signed)
Patient ID: Kenneth MasseBenjamin M Ramaswamy, male   DOB: 1965/05/18, 50 y.o.   MRN: [id number] Patient d/c to busstop with security officer as escort. No distress noted. VS stable. Denies SI/HI/AVH at this time. Discharge information reviewed with patient. Belongings returned. Patient confirmed belongings.

## 2016-02-07 ENCOUNTER — Encounter: Payer: Self-pay | Admitting: Emergency Medicine

## 2016-02-07 DIAGNOSIS — F17210 Nicotine dependence, cigarettes, uncomplicated: Secondary | ICD-10-CM | POA: Insufficient documentation

## 2016-02-07 DIAGNOSIS — Z79899 Other long term (current) drug therapy: Secondary | ICD-10-CM | POA: Insufficient documentation

## 2016-02-07 DIAGNOSIS — F209 Schizophrenia, unspecified: Secondary | ICD-10-CM | POA: Insufficient documentation

## 2016-02-07 DIAGNOSIS — I10 Essential (primary) hypertension: Secondary | ICD-10-CM | POA: Insufficient documentation

## 2016-02-07 LAB — COMPREHENSIVE METABOLIC PANEL
ALBUMIN: 4.6 g/dL (ref 3.5–5.0)
ALK PHOS: 101 U/L (ref 38–126)
ALT: 41 U/L (ref 17–63)
ANION GAP: 9 (ref 5–15)
AST: 37 U/L (ref 15–41)
BILIRUBIN TOTAL: 0.9 mg/dL (ref 0.3–1.2)
BUN: 18 mg/dL (ref 6–20)
CALCIUM: 9.1 mg/dL (ref 8.9–10.3)
CO2: 26 mmol/L (ref 22–32)
Chloride: 101 mmol/L (ref 101–111)
Creatinine, Ser: 1.18 mg/dL (ref 0.61–1.24)
GFR calc non Af Amer: >60 mL/min (ref >60)
GLUCOSE: 126 mg/dL — AB (ref 65–99)
POTASSIUM: 3.9 mmol/L (ref 3.5–5.1)
Sodium: 136 mmol/L (ref 135–145)
TOTAL PROTEIN: 7.9 g/dL (ref 6.5–8.1)

## 2016-02-07 LAB — CBC
HEMATOCRIT: 45.1 % (ref 40.0–52.0)
Hemoglobin: 15.5 g/dL (ref 13.0–18.0)
MCH: 30.4 pg (ref 26.0–34.0)
MCHC: 34.5 g/dL (ref 32.0–36.0)
MCV: 88.1 fL (ref 80.0–100.0)
PLATELETS: 327 10*3/uL (ref 150–440)
RBC: 5.12 MIL/uL (ref 4.40–5.90)
RDW: 14.3 % (ref 11.5–14.5)
WBC: 11.3 10*3/uL — AB (ref 3.8–10.6)

## 2016-02-07 LAB — ACETAMINOPHEN LEVEL

## 2016-02-07 LAB — SALICYLATE LEVEL

## 2016-02-07 LAB — ETHANOL

## 2016-02-07 NOTE — ED Triage Notes (Signed)
Patient ambulatory to triage with steady gait, without difficulty or distress noted; pt st "I done told ya'll one time! I'm hearing voices, I'm seeing thing, I'm homicidal and suicidal"

## 2016-02-08 ENCOUNTER — Emergency Department
Admission: EM | Admit: 2016-02-08 | Discharge: 2016-02-20 | Disposition: A | Discharge status: Other Acute Inpatient Hospital | Payer: Self-pay | Attending: Emergency Medicine | Admitting: Emergency Medicine

## 2016-02-08 DIAGNOSIS — Z9119 Patient's noncompliance with other medical treatment and regimen: Secondary | ICD-10-CM

## 2016-02-08 DIAGNOSIS — F200 Paranoid schizophrenia: Secondary | ICD-10-CM

## 2016-02-08 DIAGNOSIS — Z91199 Patient's noncompliance with other medical treatment and regimen due to unspecified reason: Secondary | ICD-10-CM

## 2016-02-08 DIAGNOSIS — F17200 Nicotine dependence, unspecified, uncomplicated: Secondary | ICD-10-CM | POA: Diagnosis present

## 2016-02-08 DIAGNOSIS — F209 Schizophrenia, unspecified: Secondary | ICD-10-CM

## 2016-02-08 LAB — URINE DRUG SCREEN, QUALITATIVE (ARMC ONLY)
AMPHETAMINES, UR SCREEN: NOT DETECTED
Barbiturates, Ur Screen: NOT DETECTED
Benzodiazepine, Ur Scrn: NOT DETECTED
Cannabinoid 50 Ng, Ur ~~LOC~~: NOT DETECTED
Cocaine Metabolite,Ur ~~LOC~~: NOT DETECTED
MDMA (ECSTASY) UR SCREEN: NOT DETECTED
METHADONE SCREEN, URINE: NOT DETECTED
Opiate, Ur Screen: NOT DETECTED
Phencyclidine (PCP) Ur S: NOT DETECTED
TRICYCLIC, UR SCREEN: NOT DETECTED

## 2016-02-08 MED ORDER — OLANZAPINE 10 MG PO TABS: 10.0000 mg | ORAL_TABLET | Freq: Every day | ORAL | Status: DC

## 2016-02-08 MED ORDER — PERPHENAZINE 4 MG PO TABS
8.0000 mg | ORAL_TABLET | Freq: Three times a day (TID) | ORAL | Status: DC
  Administered 2016-02-08 – 2016-02-13 (×16): 8 mg via ORAL
  Filled 2016-02-08 (×22): qty 2
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
## 2016-02-08 NOTE — ED Notes (Signed)
IVC/Consult completed/ Pending Disposition 

## 2016-02-08 NOTE — ED Notes (Signed)
Introduced self to pt and offered fluids. When asked, pt says he is having thoughts of harming others -- no one in particular -- by "blowing them up." He was polite when given a drink but asks not to be questioned when asked about AVH. He says he wants to be left alone. Will monitor for needs/safety.

## 2016-02-08 NOTE — ED Notes (Signed)
RN attempted to assess pt but pt is asleep at this time. Pt in NAD at this time.
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
## 2016-02-08 NOTE — BH Assessment (Signed)
Assessment Note  Kenneth [name] is an 50 y.o. male. Pt recently discharged on 02/06/2016 from [facility] after receiving inpatient treatment. Discharge plans were for pt to return to homeless shelter in [place], [place]. When this writer asked pt where did he ride the arranged bus ride to and he responded "don't worry about that". Pt reports "I'm hearing and seeing things"; however, pt would not share with this writer what he was hearing or seeing. Pt vaguely endorses SI/HI "today". Pt appears to be malingering with his reports of HI/SI. When asked how can we help him here at the hospital, pt reported "I know I need real help maybe I can go to [facility]". Pt appeared to be angry and irritable while speaking with TTS during assessment.  Diagnosis: Schizophrenia  Past Medical History:  Past Medical History:  Diagnosis Date  . Anxiety   . Hypercholesteremia   . Hypertension   . Schizophrenia [facility])     Past Surgical History:  Procedure Laterality Date  . CIRCUMCISION, NON-NEWBORN      Family History: No family history on file.  Social History:  reports that he has been smoking Cigarettes.  He has been smoking about 2.00 packs per day. He has never used smokeless tobacco. He reports that he drinks alcohol. He reports that he does not use drugs.  Additional Social History:  Alcohol / Drug Use Pain Medications: None Reported Prescriptions: None Reported Over the Counter: None Reported History of alcohol / drug use?: No history of alcohol / drug abuse  CIWA: CIWA-Ar BP: 121/61 Pulse Rate: 91 COWS:    Allergies:  Allergies  Allergen Reactions  . Haldol [Haloperidol] Other (See Comments)    "lock up"  . Loxapine     "Locks ups"  . Penicillins Other (See Comments)    Neck swells Has patient had a PCN reaction causing immediate rash, facial/tongue/throat swelling, SOB or lightheadedness with hypotension: yes Has patient had a PCN reaction causing severe rash involving  mucus membranes or skin necrosis: no Has patient had a PCN reaction that required hospitalization:  no Has patient had a PCN reaction occurring within the last 10 years: no If all of the above answers are "NO", then may proceed with Cephalosporin use.     Home Medications:  (Not in a hospital admission)  OB/GYN Status:  No LMP for male patient.  General Assessment Data Location of Assessment: [facility] ED TTS Assessment: In system Is this a Tele or Face-to-Face Assessment?: Face-to-Face Is this an Initial Assessment or a Re-assessment for this encounter?: Initial Assessment Marital status: Separated Maiden name: N/A Is patient pregnant?: No Pregnancy Status: No Living Arrangements:  (Homeless) Can pt return to current living arrangement?: No Admission Status: Voluntary Is patient capable of signing voluntary admission?: Yes Referral Source: Self/Family/Friend Insurance type: None  Medical Screening Exam [facility] ONLY) Medical Exam completed: Yes  Crisis Care Plan Living Arrangements:  (Homeless) Legal Guardian: Other: (Self) Name of Psychiatrist: None Name of Therapist: None  Education Status Is patient currently in school?: No Current Grade: N/A Highest grade of school patient has completed: HS Name of school: N/A Contact person: N/A  Risk to self with the past 6 months Suicidal Ideation: Yes-Currently Present Has patient been a risk to self within the past 6 months prior to admission? : No Suicidal Intent: No Has patient had any suicidal intent within the past 6 months prior to admission? : No Is patient at risk for suicide?: Yes Suicidal Plan?:  No Has patient had any suicidal plan within the past 6 months prior to admission? : No Access to Means: No What has been your use of drugs/alcohol within the last 12 months?: None Reported Previous Attempts/Gestures: Yes How many times?: 1 (2010) Other Self Harm Risks: None Reported Triggers for Past Attempts: None  known Intentional Self Injurious Behavior: None Family Suicide History: No Recent stressful life event(s): Other (Comment) (Homelessness) Persecutory voices/beliefs?: No Depression: Yes Depression Symptoms: Feeling angry/irritable Substance abuse history and/or treatment for substance abuse?: No Suicide prevention information given to non-admitted patients: Not applicable  Risk to Others within the past 6 months Homicidal Ideation: Yes-Currently Present Does patient have any lifetime risk of violence toward others beyond the six months prior to admission? : No Thoughts of Harm to Others: No Comment - Thoughts of Harm to Others:  (Patient makes vague statements of HI towards others.) Current Homicidal Intent: No Current Homicidal Plan: No Access to Homicidal Means: No Identified Victim: None Reported History of harm to others?: No Assessment of Violence: None Noted Violent Behavior Description: None Reported Does patient have access to weapons?: No Criminal Charges Pending?: No Does patient have a court date: No Is patient on probation?: No  Psychosis Hallucinations: Auditory, Visual (Pt reports "I'm hearing and seeing things") Delusions: None noted  Mental Status Report Appearance/Hygiene: In scrubs, In hospital gown Eye Contact: Fair Motor Activity: Freedom of movement Speech: Logical/coherent Level of Consciousness: Irritable, Alert Mood: Irritable Affect: Irritable Anxiety Level: None Thought Processes: Coherent, Relevant Judgement: Unimpaired Orientation: Person, Place, Time, Situation Obsessive Compulsive Thoughts/Behaviors: None  Cognitive Functioning Concentration: Good Memory: Remote Intact, Recent Intact IQ: Average Insight: Fair Impulse Control: Fair Appetite: Good Weight Loss: 0 Weight Gain: 0 Sleep: No Change Total Hours of Sleep: 6 Vegetative Symptoms: None  ADLScreening [facility] Assessment Services) Patient's cognitive ability adequate to safely  complete daily activities?: Yes Patient able to express need for assistance with ADLs?: Yes Independently performs ADLs?: Yes (appropriate for developmental age)  Prior Inpatient Therapy Prior Inpatient Therapy: Yes Prior Therapy Dates: 01/2016 [facility] Reason for Treatment: Schizoohrenia   Prior Outpatient Therapy Prior Outpatient Therapy: No Prior Therapy Dates: N/A Prior Therapy Facilty/Provider(s): N/A Reason for Treatment: N/A Does patient have an ACCT team?: No Does patient have Intensive In-House Services?  : No Does patient have [facility] services? : No Does patient have P4CC services?: No  ADL Screening (condition at time of admission) Patient's cognitive ability adequate to safely complete daily activities?: Yes Patient able to express need for assistance with ADLs?: Yes Independently performs ADLs?: Yes (appropriate for developmental age)       Abuse/Neglect Assessment (Assessment to be complete while patient is alone) Physical Abuse: Denies Verbal Abuse: Denies Sexual Abuse: Denies Exploitation of patient/patient's resources: Denies Self-Neglect: Denies Values / Beliefs Cultural Requests During Hospitalization: None Spiritual Requests During Hospitalization: None Consults Spiritual Care Consult Needed: No Social Work Consult Needed: No Merchant navy officerAdvance Directives (For Healthcare) Does patient have an advance directive?: No Would patient like information on creating an advanced directive?: No - patient declined information    Additional Information 1:1 In Past 12 Months?: No CIRT Risk: No Elopement Risk: No Does patient have medical clearance?: Yes  Child/Adolescent Assessment Running Away Risk: Denies Bed-Wetting: Denies Destruction of Property: Denies Cruelty to Animals: Denies Stealing: Denies Rebellious/Defies Authority: Denies Satanic Involvement: Denies Archivistire Setting: Denies Problems at [employer] Energy[employer]: Denies Gang Involvement:  Denies  Disposition:  Disposition Initial Assessment Completed for this Encounter: Yes Disposition of  Patient: Referred to (Psych MD to see) Other disposition(s): Other (Comment) (Psych MD to see) Patient referred to: Other (Comment)  On Site Evaluation by:   Reviewed with Physician:    Wilmon Arms 02/08/2016 5:20 AM

## 2016-02-08 NOTE — ED Notes (Signed)
BEHAVIORAL HEALTH ROUNDING  Patient sleeping: Yes Patient alert and oriented: Sleeping Behavior appropriate: Yes. ; If no, describe:  Nutrition and fluids offered: No, sleeping  Toileting and hygiene offered: No, sleeping  Sitter present: q15 minute observations and security monitoring  Law enforcement present: Yes ODS 

## 2016-02-08 NOTE — ED Notes (Signed)
Patient is IVC and is pending disposition.

## 2016-02-08 NOTE — Progress Notes (Signed)
Faxed [facility] [employer] [facility] and  received authorization # [id number]  [facility]-  And spoke to Erskine SquibbJane at [facility] and answered verbal referral questions she confirmed receipt of authorization number and verbal referral is complete.  She will call in early am to verify where [facility].  Marni Franzoni LCSW

## 2016-02-08 NOTE — ED Notes (Signed)
Pt in shower.  

## 2016-02-08 NOTE — ED Notes (Signed)
ED BHU PLACEMENT JUSTIFICATION Is the patient under IVC or is there intent for IVC: Yes.   Is the patient medically cleared: Yes.   Is there vacancy in the ED BHU: Yes.   Is the population mix appropriate for patient: Yes.   Is the patient awaiting placement in inpatient or outpatient setting: Yes.   Has the patient had a psychiatric consult: Yes.   Survey of unit performed for contraband, proper placement and condition of furniture, tampering with fixtures in bathroom, shower, and each patient room: Yes.   APPEARANCE/BEHAVIOR adequate rapport can be established NEURO ASSESSMENT Orientation: time, place and person Hallucinations: Yes.  Auditory Hallucinations Speech: Normal Gait: normal RESPIRATORY ASSESSMENT Normal expansion.  Clear to auscultation.  No rales, rhonchi, or wheezing. CARDIOVASCULAR ASSESSMENT regular rate and rhythm, S1, S2 normal, no murmur, click, rub or gallop GASTROINTESTINAL ASSESSMENT soft, nontender, BS WNL, no r/g EXTREMITIES normal strength, tone, and muscle mass PLAN OF CARE Provide calm/safe environment. Vital signs assessed twice daily. ED BHU Assessment once each 12-hour shift. Collaborate with intake RN daily or as condition indicates. Assure the ED provider has rounded once each shift. Provide and encourage hygiene. Provide redirection as needed. Assess for escalating behavior; address immediately and inform ED provider.  Assess family dynamic and appropriateness for visitation as needed: Yes.   Educate the patient/family about BHU procedures/visitation: Yes.

## 2016-02-08 NOTE — Clinical Social Work Note (Signed)
Clinical Social Work Assessment  Patient Details  Name: Kenneth [name] MRN: [id number] Date of Birth: Oct 13, 1965  Date of referral:  02/08/16               Reason for consult:  Housing Concerns/Homelessness                Permission sought to share information with:  Family Supports, Customer service manager Permission granted to share information::  Yes, Verbal Permission Granted  Name::     [phone] [place]::     Relationship::     Contact Information:  ALF/FCH  Housing/Transportation Living arrangements for the past 2 months:  Homeless Source of Information:  Patient Patient Interpreter Needed:  None Criminal Activity/Legal Involvement Pertinent to Current Situation/Hospitalization:  No - Comment as needed Significant Relationships:   (None anti social) Lives with:  Self Do you feel safe going back to the place where you live?  No Need for family participation in patient care:  No (Coment)  Care giving concerns: Biagio Quint was called and left a message awaiting a call back    Social Worker assessment / plan:  LCSW met with patient who is adamant that he went to [facility] and was turned away because he is white. LCSW offerred to call on his behalf but he said that wouldn't help. He was unable to describe the person who turned him away. He reports that he will be OK in [facility] but "no deals" when he is returned to the community ( his preference now is to go to [place]) He was not able to discuss any plans of harming himself or others but only that he would defend himself against demons. These concerns were reviewed with several staff EDP, nurses in [facility] and Dr Weber Cooks. He will be assessed. LCSW obtained verbal consent and will send to a ALF in [place] for potential for placement ( its unlikely but they will review and get back to this worker.)   Employment status:  Disabled (Comment on whether or not currently receiving Disability)  (Schizophrenic) Insurance information:  [place]icaid [place] PT Recommendations:    Information / Referral to community resources:     Patient/Family's Response to care:  I need help with getting my stuff in order  Patient/Family's Understanding of and Emotional Response to Diagnosis, Current Treatment, and Prognosis: I know I get sick and some medications help.  Emotional Assessment Appearance:  Appears stated age Attitude/Demeanor/Rapport:  Avoidant, Inconsistent, Suspicious Affect (typically observed):  Agitated, Irritable, Overwhelmed Orientation:  Oriented to Self, Oriented to Place, Oriented to  Time, Oriented to Situation Alcohol / Substance use:  Tobacco Use, Alcohol Use Psych involvement (Current and /or in the community):  Yes (Comment), Outpatient Provider (Several resources provided to patient [facility]/ and other mental health clinics in [place])  Discharge Needs  Concerns to be addressed:  Decision making concerns, Homelessness, Discharge Planning Concerns Readmission within the last 30 days:  Yes Current discharge risk:  Psychiatric Illness, Homeless Barriers to Discharge:  Other (Patient is unable to keep scheduled appointment or take his medication as prescribed, plans are laid out and he does not follow through. He is dellusional at times )   Joana Reamer, LCSW 02/08/2016, 10:48 AM

## 2016-02-08 NOTE — NC FL2 (Signed)
  [place] MEDICAID FL2 LEVEL OF CARE SCREENING TOOL     IDENTIFICATION  Patient Name: Kenneth [name] Birthdate: Jun 25, 1965 Sex: male Admission Date (Current Location): 02/08/2016  [place]   Facility and Address:  [facility], [address], [place], [place] [address]      Provider Number: [id number]  Attending Physician Name and Address:  Arnaldo NatalPaul F Malinda, MD  Relative Name and Phone Number:       Current Level of Care: Hospital Recommended Level of Care: Assisted Living Facility, Family Care Home Prior Approval Number:    Date Approved/Denied:   PASRR Number:    Discharge Plan: Domiciliary (Rest home)    Current Diagnoses: Patient Active Problem List   Diagnosis Date Noted  . Tobacco use disorder 02/05/2016  . Schizophrenia, paranoid (HCC) 01/26/2016  . Antisocial personality disorder 01/23/2016  . Noncompliance 01/23/2016    Orientation RESPIRATION BLADDER Height & Weight     Self, Time, Situation, Place  Normal Continent Weight: 262 lb (118.8 kg) Height:  5' 11.5" (181.6 cm)  BEHAVIORAL SYMPTOMS/MOOD NEUROLOGICAL BOWEL NUTRITION STATUS      Continent (S) Diet (Normal)  AMBULATORY STATUS COMMUNICATION OF NEEDS Skin   Independent Verbally Normal                       Personal Care Assistance Level of Assistance  Bathing, Feeding, Dressing, Total care Bathing Assistance: Independent Feeding assistance: Independent Dressing Assistance: Independent Total Care Assistance: Independent   Functional Limitations Info  Sight, Hearing, Speech Sight Info: Adequate Hearing Info: Adequate Speech Info: Adequate    SPECIAL CARE FACTORS FREQUENCY                       Contractures      Additional Factors Info  Code Status, Allergies, Psychotropic   Allergies Info: Haldol,( Haloperidol) Loxapine, Penicillians Psychotropic Info: Trilafon ( 8 mg) 1 tablet 3x day         Current  Medications (02/08/2016):  This is the current hospital active medication list Current Facility-Administered Medications  Medication Dose Route Frequency Provider Last Rate Last Dose  . OLANZapine (ZYPREXA) tablet 10 mg  10 mg Oral QHS Arnaldo NatalPaul F Malinda, MD      . perphenazine (TRILAFON) tablet 8 mg  8 mg Oral TID Arnaldo NatalPaul F Malinda, MD   8 mg at 02/08/16 0945   Current Outpatient Prescriptions  Medication Sig Dispense Refill  . OLANZapine (ZYPREXA) 10 MG tablet Take 30 mg by mouth at bedtime.    Marland Kitchen. perphenazine (TRILAFON) 8 MG tablet Take 1 tablet (8 mg total) by mouth 3 (three) times daily. 90 tablet 3     Discharge Medications: Please see discharge summary for a list of discharge medications.  Relevant Imaging Results:  Relevant Lab Results:   Additional Information SSN  [employer], [place] M, [place]

## 2016-02-08 NOTE — ED Notes (Signed)
Report was received KS, RN; Pt. Verbalizes no complaints or distress; denies S.I./Hi. Pt. Noted in his room talking to himself; when asked by this writer about S.I./H.I.; A/V hallucinations or delusions; Pt. States, "I'm feeling; I ain't talking about it; I mean that; I mean that." Continue to monitor with 15 min. Monitoring.

## 2016-02-08 NOTE — ED Notes (Signed)
Pt is alert and oriented on admission. Pt endorses passive SI/HI and AH with thoughts of harming self and others, but states that he feels safe at this time and is "just tired." Pt forwards little and his affect is blunted but he is cooperative with staff. Writer oriented pt to the BHU, provided fluids and 15 minute checks are ongoing for safety. Pt went to sleep soon after arriving on the unit.

## 2016-02-08 NOTE — ED Provider Notes (Signed)
[facility] Emergency Department Provider Note   ____________________________________________   First MD Initiated Contact with Patient 02/08/16 [phone]     (approximate)  I have reviewed the triage vital signs and the nursing notes.   HISTORY  Chief Complaint Mental Health Problem   HPI Kenneth [name] is a 50 y.o. male who complains of hearing voices or having homicidal and suicidal ideation. He really does not want to say much. He does say that he's allergic to Haldol and makes him stiff and locks up. He cannot get him loosened after you give him the Haldol. Patient is calm and will answer questions for me. He does not tell me how or how long he's been having voices. He does have a history of schizophrenia.  Past Medical History:  Diagnosis Date  . Anxiety   . Hypercholesteremia   . Hypertension   . Schizophrenia [facility])     Patient Active Problem List   Diagnosis Date Noted  . Tobacco use disorder 02/05/2016  . Schizophrenia, paranoid (HCC) 01/26/2016  . Antisocial personality disorder 01/23/2016  . Noncompliance 01/23/2016    Past Surgical History:  Procedure Laterality Date  . CIRCUMCISION, NON-NEWBORN      Prior to Admission medications   Medication Sig Start Date End Date Taking? Authorizing Provider  OLANZapine (ZYPREXA) 10 MG tablet Take 30 mg by mouth at bedtime.   Yes Historical Provider, MD  perphenazine (TRILAFON) 8 MG tablet Take 1 tablet (8 mg total) by mouth 3 (three) times daily. 02/05/16  Yes Shari Prows, MD    Allergies Haldol [haloperidol]; Loxapine; and Penicillins  No family history on file.  Social History Social History  Substance Use Topics  . Smoking status: Current Every Day Smoker    Packs/day: 2.00    Types: Cigarettes  . Smokeless tobacco: Never Used  . Alcohol use Yes     Comment: social drinker    Review of Systems Constitutional: No fever/chills Eyes: No visual changes. ENT: No sore  throat. Cardiovascular: Denies chest pain. Respiratory: Denies shortness of breath. Gastrointestinal: No abdominal pain.  No nausea, no vomiting.  No diarrhea.  No constipation. Genitourinary: Negative for dysuria. Musculoskeletal: Negative for back pain. } 10-point ROS otherwise negative.  ____________________________________________   PHYSICAL EXAM:  VITAL SIGNS: ED Triage Vitals  Enc Vitals Group     BP 02/07/16 2300 (!) 152/126     Pulse Rate 02/07/16 2300 (!) 103     Resp 02/07/16 2300 18     Temp 02/07/16 2300 98.3 F (36.8 C)     Temp Source 02/07/16 2300 Oral     SpO2 02/07/16 2300 96 %     Weight 02/07/16 2301 262 lb (118.8 kg)     Height 02/07/16 2301 5' 11.5" (1.816 m)     Head Circumference --      Peak Flow --      Pain Score --      Pain Loc --      Pain Edu? --      Excl. in GC? --     Constitutional: Alert and oriented. Well appearing and in no acute distress. Eyes: Conjunctivae are normal. PERRL. EOMI. Head: Atraumatic. Nose: No congestion/rhinnorhea. Mouth/Throat: Mucous membranes are moist.  Oropharynx non-erythematous. Neck: No stridor. Cardiovascular: Normal rate, regular rhythm. Grossly normal heart sounds.  Good peripheral circulation. Respiratory: Normal respiratory effort.  No retractions. Lungs CTAB. Gastrointestinal: Soft and nontender. No distention. No abdominal bruits. No CVA tenderness. Musculoskeletal: No  lower extremity tenderness nor edema.  No joint effusions. Neurologic:  Normal speech and language. No gross focal neurologic deficits are appreciated. . Skin:  Skin is warm, dry and intact. No rash noted. Psychiatric: Patient laying in bed with his arms crossed and an angry expression on his face. He is however cooperative with me.  ____________________________________________   LABS (all labs ordered are listed, but only abnormal results are displayed)  Labs Reviewed  COMPREHENSIVE METABOLIC PANEL - Abnormal; Notable for the  following:       Result Value   Glucose, Bld 126 (*)    All other components within normal limits  ACETAMINOPHEN LEVEL - Abnormal; Notable for the following:    Acetaminophen (Tylenol), Serum <10 (*)    All other components within normal limits  CBC - Abnormal; Notable for the following:    WBC 11.3 (*)    All other components within normal limits  ETHANOL  SALICYLATE LEVEL  URINE DRUG SCREEN, QUALITATIVE ([facility] ONLY)   ____________________________________________  EKG   ____________________________________________  RADIOLOGY   ____________________________________________   PROCEDURES  Procedure(s) performed:   Procedures  Critical Care performed:  ____________________________________________   INITIAL IMPRESSION / ASSESSMENT AND PLAN / ED COURSE  Pertinent labs & imaging results that were available during my care of the patient were reviewed by me and considered in my medical decision making (see chart for details).    Clinical Course     ____________________________________________   FINAL CLINICAL IMPRESSION(S) / ED DIAGNOSES  Final diagnoses:  Schizophrenia, unspecified type (HCC)      NEW MEDICATIONS STARTED DURING THIS VISIT:  New Prescriptions   No medications on file     Note:  This document was prepared using Dragon voice recognition software and may include unintentional dictation errors.    Arnaldo NatalPaul F Nikelle Malatesta, MD 02/08/16 [phone]

## 2016-02-09 LAB — URINALYSIS COMPLETE WITH MICROSCOPIC (ARMC ONLY)
BILIRUBIN URINE: NEGATIVE
Bacteria, UA: NONE SEEN
GLUCOSE, UA: NEGATIVE mg/dL
Hgb urine dipstick: NEGATIVE
Leukocytes, UA: NEGATIVE
Nitrite: NEGATIVE
PROTEIN: NEGATIVE mg/dL
SQUAMOUS EPITHELIAL / LPF: NONE SEEN
Specific Gravity, Urine: 1.02 (ref 1.005–1.030)
pH: 5 (ref 5.0–8.0)

## 2016-02-09 NOTE — ED Notes (Signed)
Report was received from Amy H., RN; Pt. Verbalizes no complaints or distress; denies S.I./Hi. [facility]; to stay there a long time.  Continue to monitor with 15 min. Monitoring.

## 2016-02-09 NOTE — ED Notes (Signed)
Patient resting quietly in room. No noted distress or abnormal behaviors noted. Will continue 15 minute checks and observation by security camera for safety. 
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
## 2016-02-09 NOTE — ED Notes (Signed)
Patient received breakfast tray. Patient is alert and oriented. Cooperative with all nursing interventions. Mood is irritable with congruent affect. Speech is clear and coherent, thoughts are organized and goal directed. No evidence of any gross psychosis.  Maintained on 15 minute checks and observation by security camera for safety.

## 2016-02-09 NOTE — Progress Notes (Signed)
LCSW faxed over urinalysis and EDP documentation. Spoke with Junious Dresseronnie [facility] and LCSW will be advised of wait list shortly.   Delta Air LinesClaudine Cledith Kamiya LCSW [phone]

## 2016-02-09 NOTE — ED Notes (Signed)
Patient awake, alert, and oriented. Requested and received beverage. Maintained on 15 minute checks and observation by security camera for safety.

## 2016-02-09 NOTE — ED Provider Notes (Signed)
-----------------------------------------   7:34 AM on 02/09/2016 -----------------------------------------   Blood pressure 121/68, pulse 70, temperature 98.4 F (36.9 C), temperature source Oral, resp. rate 18, height 5' 11.5" (1.816 m), weight 262 lb (118.8 kg), SpO2 96 %.  The patient had no acute events since last update.  Calm and cooperative at this time.  Patient with marked any history of schizophrenia, psychotic, requires inpatient psychiatric stabilization, [facility] as [facility] by Dr. Toni Amendlapacs.     Gayla DossEryka A Dyshon Philbin, MD 02/09/16 [phone]

## 2016-02-09 NOTE — ED Notes (Signed)
Pt. Alert and oriented, warm and dry, in no distress. Pt. Denies SI, and AVH. When asked if pt was having any HI. Patient states I don' won't to talk about it. Patient was rude and very irritable during assessment. Pt. Encouraged to let nursing staff know of any concerns or needs.

## 2016-02-09 NOTE — ED Notes (Signed)

## 2016-02-09 NOTE — ED Notes (Signed)
Patient used restroom and returned to room.

## 2016-02-09 NOTE — ED Notes (Signed)
Patient received meal tray 

## 2016-02-09 NOTE — Progress Notes (Signed)
At 9 05pm [facility] patient have a urinalysis and quick note from edp that they are in agreance this [facility].  Consulted EDP [facility]  LCSW will fax over these requested documents [facility]   Mayvillelaudine Ariyannah Pauling LCSW [phone]

## 2016-02-09 NOTE — ED Notes (Signed)

## 2016-02-09 NOTE — ED Notes (Signed)
Patient received lunch

## 2016-02-09 NOTE — ED Notes (Signed)
Sandwich and soft drink given. Pt complained of banana being rotten. This Clinical research associatewriter gave fresh banana.

## 2016-02-10 NOTE — ED Notes (Signed)
Patient received meal tray and beverage. 

## 2016-02-10 NOTE — ED Notes (Signed)
Patient asleep in room. No noted distress or abnormal behavior. Will continue 15 minute checks and observation by security cameras for safety. 

## 2016-02-10 NOTE — ED Notes (Signed)
Report was received from Amy H., RN; Pt. Verbalizes no complaints or distress; denies S.I./Hi. Continue to monitor with 15 min. Monitoring. 

## 2016-02-10 NOTE — ED Notes (Signed)
Patient resting quietly in room. No noted distress or abnormal behaviors noted. Will continue 15 minute checks and observation by security camera for safety. 

## 2016-02-10 NOTE — ED Notes (Signed)
Pt IVC/ [facility].

## 2016-02-10 NOTE — ED Provider Notes (Signed)
-----------------------------------------   6:09 AM on 02/10/2016 -----------------------------------------   Blood pressure 124/90, pulse 81, temperature 98.3 F (36.8 C), temperature source Oral, resp. rate 18, height 5' 11.5" (1.816 m), weight 118.8 kg, SpO2 98 %.  The patient had no acute events since last update.  Calm and cooperative at this time.  Disposition is pending Psychiatry/Behavioral Medicine team recommendations; [facility]    Loleta Roseory Jenene Kauffmann, MD 02/10/16 [phone]

## 2016-02-10 NOTE — ED Notes (Signed)
Patient given snack and beverage.  

## 2016-02-10 NOTE — ED Notes (Signed)
Patient received lunch tray 

## 2016-02-10 NOTE — ED Notes (Signed)

## 2016-02-10 NOTE — ED Notes (Signed)
Patient verbally challenging with nurse but redirectable. Primarily staying in his room watching television. In no apparent distress. Maintained on 15 minute checks and observation by security camera for safety.

## 2016-02-10 NOTE — ED Notes (Signed)

## 2016-02-10 NOTE — ED Notes (Signed)
Patient awake, alert, and oriented. States he is "doing well."  Patient received breakfast tray.

## 2016-02-11 NOTE — ED Notes (Signed)
Report was received from Karena T., RN; Pt. Verbalizes no complaints or distress; denies S.I./Hi. Continue to monitor with 15 min. Monitoring. 

## 2016-02-11 NOTE — ED Notes (Signed)
PT IVC/ PENDING PLACEMENT  

## 2016-02-11 NOTE — ED Provider Notes (Signed)
-----------------------------------------   7:45 AM on 02/11/2016 -----------------------------------------   Blood pressure 127/73, pulse 71, temperature 98.2 F (36.8 C), temperature source Oral, resp. rate 18, height 5' 11.5" (1.816 m), weight 262 lb (118.8 kg), SpO2 96 %.  The patient had no acute events since last update.  Calm and cooperative at this time.  [facility]     Rebecka ApleyAllison P Rileigh Kawashima, MD 02/11/16 [phone]

## 2016-02-11 NOTE — ED Notes (Signed)
Patient resting quietly in room. No noted distress or abnormal behaviors noted. Will continue 15 minute checks and observation by security camera for safety. 

## 2016-02-11 NOTE — ED Notes (Signed)
Patient resting quietly in room. Patient took scheduled medications without issue. No noted distress or abnormal behaviors noted. Will continue 15 minute checks and observation by security camera for safety..Marland Kitchen

## 2016-02-11 NOTE — ED Notes (Signed)
Patient currently in room resting. No signs of acute distress noted at this time. Maintained on 15 minute checks and observation by security camera for safety.  

## 2016-02-11 NOTE — ED Notes (Signed)
ENVIRONMENTAL ASSESSMENT Potentially harmful objects out of patient reach: Yes Personal belongings secured: Yes Patient dressed in hospital provided attire only: Yes Plastic bags out of patient reach: Yes Patient care equipment (cords, cables, call bells, lines, and drains) shortened, removed, or accounted for: Yes Equipment and supplies removed from bottom of stretcher: Yes Potentially toxic materials out of patient reach: Yes Sharps container removed or out of patient reach: Yes  Patient is currently in room resting. No signs of acute distress noted. Maintained on 15 minute checks and observation by security camera for safety.  

## 2016-02-11 NOTE — ED Notes (Signed)
Patient in room resting. No signs of distress noted. Maintained on 15 minute checks and observation by security camera for safety.  

## 2016-02-11 NOTE — ED Notes (Signed)
Patient currently in dayroom socializing with peers. No signs of acute distress noted at this time. Maintained on 15 minute checks and observation by security camera for safety.

## 2016-02-11 NOTE — Progress Notes (Signed)
CSW called [facility] [phone]). Pt is currently on the wait list.  Jonathon JordanLynn B Shaunice Levitan, MSW, [employer] IncLCSWA [phone]

## 2016-02-11 NOTE — ED Notes (Signed)
Patient currently denies SI but states that sometimes he has thoughts about hurting others with no plan. Patient did not name specific individuals to harm. Patient denies AVH and pain. Patient is irritable but cooperative with nursing interventions. No signs of acute distress noted at this time. Maintained on 15 minute checks and observation by security camera for safety.

## 2016-02-11 NOTE — ED Notes (Signed)
Patient in room watching television. No signs of distress noted. Maintained on 15 minute checks and observation by security camera for safety.  

## 2016-02-11 NOTE — ED Notes (Signed)

## 2016-02-11 NOTE — ED Notes (Signed)
Patient currently in room resting. No signs of acute distress noted. Maintained on 15 minute checks and observation by security camera for safety.  

## 2016-02-11 NOTE — ED Notes (Signed)
Patient in room resting and watching television. No signs of acute distress noted. Maintained on 15 minute checks and observation by security camera for safety.  

## 2016-02-12 NOTE — ED Provider Notes (Signed)
-----------------------------------------   6:42 AM on 02/12/2016 -----------------------------------------   Blood pressure (!) 100/53, pulse 67, temperature 97.7 F (36.5 C), temperature source Oral, resp. rate 16, height 5' 11.5" (1.816 m), weight 118.8 kg, SpO2 98 %.  The patient had no acute events since last update.  Calm and cooperative at this time.  [facility] wait list.    Loleta Roseory Kooper Chriswell, MD 02/12/16 [phone]

## 2016-02-12 NOTE — Progress Notes (Signed)
[phone]). Pt is still on the wait list.  Kenneth JordanLynn B Yurem [name], MSW, Kenneth MajorsLCSWA [phone]

## 2016-02-12 NOTE — ED Notes (Signed)
This writer went to check on patient and asked if patient was okay. Patient responded, "I don't want to talk about it". Patient advised that if he becomes more comfortable speaking about his concerns during the day nursing staff would be willing to listen. Will continue to monitor. Maintained on 15 minute checks and observation by security camera for safety.

## 2016-02-12 NOTE — ED Notes (Signed)
Mood improving but he still is answering no questions

## 2016-02-12 NOTE — ED Notes (Signed)
Report was received from Ruth B., RN; Pt. Verbalizes no complaints or distress; denies S.I./Hi. Continue to monitor with 15 min. Monitoring. 

## 2016-02-12 NOTE — ED Notes (Signed)

## 2016-02-12 NOTE — ED Notes (Signed)
Pt oob with no problems ambulating and asked to take a shower

## 2016-02-13 DIAGNOSIS — F200 Paranoid schizophrenia: Secondary | ICD-10-CM | POA: Diagnosis not present

## 2016-02-13 MED ORDER — PERPHENAZINE 4 MG PO TABS
12.0000 mg | ORAL_TABLET | Freq: Three times a day (TID) | ORAL | Status: DC
  Administered 2016-02-13 – 2016-02-20 (×21): 12 mg via ORAL
  Filled 2016-02-13 (×24): qty 3

## 2016-02-13 NOTE — Consult Note (Signed)
[facility] Face-to-Face Psychiatry Consult   Reason for Consult:  Consult follow-up for 50 year old man with schizophrenia currently in the emergency room Referring Physician:  Inocencio Homes Patient Identification: Kenneth [name] MRN:  [id number] Principal Diagnosis: Schizophrenia, paranoid [facility]) Diagnosis:   Patient Active Problem List   Diagnosis Date Noted  . Tobacco use disorder [F17.200] 02/05/2016  . Schizophrenia, paranoid (HCC) [F20.0] 01/26/2016  . Antisocial personality disorder [F60.2] 01/23/2016  . Noncompliance [Z91.19] 01/23/2016    Total Time spent with patient: 45 minutes  Subjective:   Kenneth [name] is a 50 y.o. male patient admitted with "no I only killed illuminati".  HPI:  Patient interviewed. Chart reviewed. This 50 year old man has been here in the emergency room for some time waiting for appropriate disposition. He had been determined to be unlikely to benefit from further treatment on the inpatient unit here and we are awaiting referral to [facility]. Patient requested to speak to me at length today. The conversation was largely based around multiple delusions of his. He was calm and discussing them but talked about various people that he either had killed in the pastor wanted to kill in the future. The content was grandiose and delusional. His request was that we release him but his plans for how he would take care of himself were unrealistic and probably delusionally based. Patient is not showing any sign of akathisia or stiffness. He has been eating and his vital signs are normal.  Past Psychiatric History: Long history of schizophrenia also history of antisocial behavior with multiple hospitalizations as well as multiple imprisonments. Has been on a wide range of medicines in the past. Reportedly did well on clozapine at one point but he states to me that he will not take it again because of side effects. Patient talks a lot about past suicidal and homicidal  behavior but it's hard to know how much of it is delusional.  Risk to Self: Suicidal Ideation: Yes-Currently Present Suicidal Intent: No Is patient at risk for suicide?: Yes Suicidal Plan?: No Access to Means: No What has been your use of drugs/alcohol within the last 12 months?: None Reported How many times?: 1 (2010) Other Self Harm Risks: None Reported Triggers for Past Attempts: None known Intentional Self Injurious Behavior: None Risk to Others: Homicidal Ideation: Yes-Currently Present Thoughts of Harm to Others: No Comment - Thoughts of Harm to Others:  (Patient makes vague statements of HI towards others.) Current Homicidal Intent: No Current Homicidal Plan: No Access to Homicidal Means: No Identified Victim: None Reported History of harm to others?: No Assessment of Violence: None Noted Violent Behavior Description: None Reported Does patient have access to weapons?: No Criminal Charges Pending?: No Does patient have a court date: No Prior Inpatient Therapy: Prior Inpatient Therapy: Yes Prior Therapy Dates: 01/2016 Prior Therapy Facilty/Provider(s): [facility] Reason for Treatment: Schizoohrenia  Prior Outpatient Therapy: Prior Outpatient Therapy: No Prior Therapy Dates: N/A Prior Therapy Facilty/Provider(s): N/A Reason for Treatment: N/A Does patient have an ACCT team?: No Does patient have Intensive In-House Services?  : No Does patient have [facility] services? : No Does patient have P4CC services?: No  Past Medical History:  Past Medical History:  Diagnosis Date  . Anxiety   . Hypercholesteremia   . Hypertension   . Schizophrenia [facility])     Past Surgical History:  Procedure Laterality Date  . CIRCUMCISION, NON-NEWBORN     Family History: No family history on file. Family Psychiatric  History: Positive for psychosis Social History:  History  Alcohol Use  . Yes    Comment: social drinker     History  Drug Use No    Social History   Social History  .  Marital status: Legally Separated    Spouse name: N/A  . Number of children: N/A  . Years of education: N/A   Social History Main Topics  . Smoking status: Current Every Day Smoker    Packs/day: 2.00    Types: Cigarettes  . Smokeless tobacco: Never Used  . Alcohol use Yes     Comment: social drinker  . Drug use: No  . Sexual activity: Not Asked   Other Topics Concern  . None   Social History Narrative  . None   Additional Social History:    Allergies:   Allergies  Allergen Reactions  . Haldol [Haloperidol] Other (See Comments)    "lock up"  . Loxapine     "Locks ups"  . Penicillins Other (See Comments)    Neck swells Has patient had a PCN reaction causing immediate rash, facial/tongue/throat swelling, SOB or lightheadedness with hypotension: yes Has patient had a PCN reaction causing severe rash involving mucus membranes or skin necrosis: no Has patient had a PCN reaction that required hospitalization:  no Has patient had a PCN reaction occurring within the last 10 years: no If all of the above answers are "NO", then may proceed with Cephalosporin use.     Labs: No results found for this or any previous visit (from the past 48 hour(s)).  Current Facility-Administered Medications  Medication Dose Route Frequency Provider Last Rate Last Dose  . perphenazine (TRILAFON) tablet 8 mg  8 mg Oral TID Arnaldo Natal, MD   8 mg at 02/13/16 1006   Current Outpatient Prescriptions  Medication Sig Dispense Refill  . OLANZapine (ZYPREXA) 10 MG tablet Take 30 mg by mouth at bedtime.    Marland Kitchen perphenazine (TRILAFON) 8 MG tablet Take 1 tablet (8 mg total) by mouth 3 (three) times daily. 90 tablet 3    Musculoskeletal: Strength & Muscle Tone: within normal limits Gait & Station: normal Patient leans: N/A  Psychiatric Specialty Exam: Physical Exam  Nursing note and vitals reviewed. Constitutional: He appears well-developed and well-nourished.  HENT:  Head: Normocephalic and  atraumatic.  Eyes: Conjunctivae are normal. Pupils are equal, round, and reactive to light.  Neck: Normal range of motion.  Cardiovascular: Regular rhythm and normal heart sounds.   Respiratory: Effort normal. No respiratory distress.  GI: Soft.  Musculoskeletal: Normal range of motion.  Neurological: He is alert.  Skin: Skin is warm and dry.  Psychiatric: His speech is normal and behavior is normal. His affect is blunt. Thought content is paranoid and delusional. Cognition and memory are impaired. He expresses impulsivity. He expresses homicidal ideation.    Review of Systems  Constitutional: Negative.   HENT: Negative.   Eyes: Negative.   Respiratory: Negative.   Cardiovascular: Negative.   Gastrointestinal: Negative.   Musculoskeletal: Negative.   Skin: Negative.   Neurological: Negative.   Psychiatric/Behavioral: Positive for hallucinations. Negative for depression, memory loss, substance abuse and suicidal ideas. The patient is nervous/anxious. The patient does not have insomnia.     Blood pressure 118/72, pulse 68, temperature 97.8 F (36.6 C), temperature source Oral, resp. rate 16, height 5' 11.5" (1.816 m), weight 118.8 kg (262 lb), SpO2 97 %.Body mass index is 36.03 kg/m.  General Appearance: Fairly Groomed  Eye Contact:  Fair  Speech:  Slow  Volume:  Decreased  Mood:  Dysphoric  Affect:  Constricted  Thought Process:  Disorganized and Irrelevant  Orientation:  Full (Time, Place, and Person)  Thought Content:  Delusions, Paranoid Ideation and Tangential  Suicidal Thoughts:  Yes.  without intent/plan  Homicidal Thoughts:  Yes.  with intent/plan  Memory:  Immediate;   Good Recent;   Fair Remote;   Fair  Judgement:  Impaired  Insight:  Shallow  Psychomotor Activity:  Normal  Concentration:  Concentration: Fair  Recall:  [employer]Fair  Fund of Knowledge:  Fair  Language:  Fair  Akathisia:  No  Handed:  Right  AIMS (if indicated):     Assets:  Communication  Skills Desire for Improvement Physical Health Resilience  ADL's:  Intact  Cognition:  Impaired,  Mild  Sleep:        Treatment Plan Summary: Daily contact with patient to assess and evaluate symptoms and progress in treatment, Medication management and Plan 50 year old man with schizophrenia. Continues to be psychotic although he has not been violent here in the emergency room. We have been treating him with Trilafon 8 mg 3 times a day. Tolerating it well. I am going to increase his dose to 12 mg 3 times a day. Patient was listened to respectfully and encouraged to be patient with our attempts to get him into appropriate placement to allow him to improve his situation. Ultimately he appeared to be agreeable to that. Continue monitoring and treatment while we await for appropriate disposition.  Disposition: Recommend psychiatric Inpatient admission when medically cleared.  Mordecai RasmussenJohn Clapacs, MD 02/13/2016 12:59 PM

## 2016-02-13 NOTE — BH Assessment (Signed)
Confirmed with [facility] ([phone]), patient remain on their Waitlist.

## 2016-02-13 NOTE — ED Notes (Signed)
Patient is currently in room resting. No signs of acute distress noted. Maintained on 15 minute checks and observation by security camera for safety.

## 2016-02-13 NOTE — ED Notes (Signed)
Patient resting quietly in room. No noted distress or abnormal behaviors noted. Will continue 15 minute checks and observation by security camera for safety. 

## 2016-02-13 NOTE — ED Provider Notes (Signed)
-----------------------------------------   7:30 AM on 02/13/2016 -----------------------------------------   Blood pressure 118/72, pulse 68, temperature 97.8 F (36.6 C), temperature source Oral, resp. rate 16, height 5' 11.5" (1.816 m), weight 262 lb (118.8 kg), SpO2 97 %.  The patient had no acute events since last update.  Calm and cooperative at this time.  Disposition is pending Psychiatry/Behavioral Medicine team recommendations.     Gayla DossEryka A Hilary Milks, MD 02/13/16 0730

## 2016-02-13 NOTE — ED Notes (Signed)
Patient received lunch and is now in room resting. No signs of acute distress noted. Maintained on 15 minute checks and observation by security camera for safety.

## 2016-02-13 NOTE — ED Notes (Signed)
Patient asked if he would be transferred to another hospital. Patient was told that he would be transferred but that a date of discharge was not scheduled yet. Patient voiced understanding. No signs of acute distress noted. Maintained on 15 minute checks and observation by security camera for safety.

## 2016-02-13 NOTE — ED Notes (Signed)
Pt asking to speak with Dr and Child psychotherapistsocial worker about his disposition plans

## 2016-02-13 NOTE — ED Notes (Signed)
Patient in room speaking with psychiatrist at this time. No signs of acute distress noted. Maintained on 15 minute checks and observation by security camera for safety.  

## 2016-02-14 NOTE — ED Notes (Signed)
[phone]). Pt remains on the wait list.  Kenneth [name], MSW, [employer] IncLCSWA [phone]

## 2016-02-14 NOTE — ED Provider Notes (Signed)
-----------------------------------------   6:01 AM on 02/14/2016 -----------------------------------------   Blood pressure 140/76, pulse 73, temperature 98.2 F (36.8 C), temperature source Oral, resp. rate 16, height 5' 11.5" (1.816 m), weight 118.8 kg, SpO2 97 %.  The patient had no acute events since last update.  Calm and cooperative at this time.  Disposition is pending Psychiatry/Behavioral Medicine team recommendations.     Loleta Roseory Homero Hyson, MD 02/14/16 [phone]

## 2016-02-14 NOTE — ED Notes (Signed)
IVC/Pending placement 

## 2016-02-14 NOTE — ED Notes (Addendum)
Pt is alert and oriented this evening. Pt mood is sad/depressed but he is pleasant and cooperative with staff. Food and drink provided. Writer discussed tx plan and 15 minute checks are ongoing for safety.

## 2016-02-14 NOTE — BH Assessment (Signed)
Confirmed with [facility] ([phone]), patient remain on their Waitlist.  

## 2016-02-14 NOTE — ED Notes (Signed)
Pt is alert and oriented this evening. Pt denies SI/HI but endorses occasional AH. Pt is somewhat irritable and demanding but is ultimately cooperative with staff. Writer provided food and drink, discussed tx plan and 15 minute checks are ongoing for safety.

## 2016-02-14 NOTE — Consult Note (Signed)
Psychiatry: Patient has no new complaints. Continues to be withdrawn most of the time. Paranoid and disorganized in his thinking when engaged in conversation. Making multiple threats to kill various people but not acting out violently here. Commitment paperwork has been renewed today as he is still not appropriate for discharge. Kenneth [name] did come by and check in with him. Continue current medicine as [facility]. Yesterday I increased his antipsychotic and he appears to be tolerating that without any difficulty.

## 2016-02-14 NOTE — ED Notes (Signed)
Pt  Under  IVC pending  Placement

## 2016-02-14 NOTE — ED Notes (Signed)
Patient is IVC and is pending placement. 

## 2016-02-15 NOTE — ED Notes (Signed)
Pt in shower, pt given towels and washclothes, clean change of clothes, toothbrush, toothpaste, deodorant, shampoo and comb.

## 2016-02-15 NOTE — ED Notes (Signed)
Pt done with shower, changed into clean purple scrubs. Pt returned all items listed in previous note by this RN. Pt given clean linen to change bed.

## 2016-02-15 NOTE — ED Notes (Signed)
Supper tray given to Patient.  

## 2016-02-15 NOTE — ED Notes (Signed)
Pt given water 

## 2016-02-15 NOTE — ED Provider Notes (Signed)
-----------------------------------------   3:01 AM on 02/15/2016 -----------------------------------------   Blood pressure (!) 150/82, pulse 73, temperature 97.7 F (36.5 C), temperature source Oral, resp. rate 18, height 5' 11.5" (1.816 m), weight 262 lb (118.8 kg), SpO2 98 %.  The patient had no acute events since last update.  Calm and cooperative at this time.  Patient has been evaluated by psychiatry, they have made medication changes and will continue to monitor the patient in the emergency department on a daily basis. The IVC was renewed yesterday, currently awaiting psychiatric disposition.     Minna AntisKevin Lailanie Hasley, MD 02/15/16 [phone]

## 2016-02-15 NOTE — Progress Notes (Signed)
LCSW called [facility] and patient remains on the wait list as per [place]. Admissions stated unlikely that there will be any movement at [facility] over weekend. Patient was informed and received information on their plan of care, some agitation and disappointment noted.  Bronwen Pendergraft LCSW [phone] 

## 2016-02-15 NOTE — ED Notes (Signed)
CSW at bedside.

## 2016-02-15 NOTE — ED Notes (Signed)
Patient took po medications without difficulty that was scheduled, Patient is pacing in dayroom, no signs of distress. Patient is cooperative.  q 15 min. Checks, and camera monitoring in progress.

## 2016-02-15 NOTE — ED Notes (Signed)
Pt  ivc  Pending  placement 

## 2016-02-15 NOTE — ED Notes (Signed)
Pt given breakfast tray and gingerale 

## 2016-02-15 NOTE — ED Notes (Signed)
Pt given lunch tray.

## 2016-02-15 NOTE — ED Notes (Signed)
Patient observed with no unusual behavior or acute distress. Patient with no verbalizes needs or c/o at this time. Will continue to monitor and follow up at needed. Pt monitored by security cameras at all times.   

## 2016-02-15 NOTE — Progress Notes (Signed)
After brief discussion Kenneth [name] disclosed he "Is the Kenneth [name] and he needs to eave so he can "conquer the [employer]" they interfered with his plans in [place] and he needs to get out to take care of business as he is the" Kenneth [name] of Kenneth [name]" It is apparent the patient remains delusional. As per patient he was informed of [facility] waitlist-LCSW asked patient to cooperate with nursing staff. He has agreed for this worker to contact DSS to have his benefits re-instated.   LCSW called Marylou FlesherJackie Davis [phone] left message  Arrie SenateClaudine Viraaj Vorndran LCSW [phone]

## 2016-02-16 NOTE — ED Notes (Signed)
Patient resting quietly in room. No noted distress or abnormal behaviors noted. Will continue 15 minute checks and observation by security camera for safety. 

## 2016-02-16 NOTE — ED Notes (Signed)
Patient in no acute distress. No evidence of psychosis. Patient's speech/thoughts goal directed and organized. No reports of hallucinations. Maintained on 15 minute checks and observation by security camera for safety.

## 2016-02-16 NOTE — ED Notes (Signed)
Patient received lunch tray 

## 2016-02-16 NOTE — ED Notes (Signed)
Patient getting asking questions [facility]. He was told that he was on the waiting list and we had no information about when he would be going. Patient accused staff of "lying" to him. Irritable and verbally hostile. He requested to speak to Dr. Toni Amendlapacs and was told that the doctor was not working this weekend. Patient walked back to his room.

## 2016-02-16 NOTE — ED Notes (Signed)
Patient noted in room. No complaints, stable, in no acute distress. Q15 minute rounds and monitoring via Security Cameras to continue.  

## 2016-02-16 NOTE — ED Notes (Signed)
Patient received supper tray. 

## 2016-02-16 NOTE — Progress Notes (Signed)
Late Submission: LCSW provided patient support and asked patient to remain in room due to high acuity of another patient. Patient was supported and calm.  Aashvi Rezabek LCSW [phone]  

## 2016-02-16 NOTE — ED Notes (Signed)

## 2016-02-16 NOTE — Progress Notes (Signed)
LCSW called [facility] and was informed by admissions clerk there are no current beds available and patient remains on the waitlist and [facility] will contact us when a bed is available.  Delta Air LinesClaudine Nicolaas Savo LCSW [phone]

## 2016-02-16 NOTE — ED Notes (Signed)
Patient took a shower. Patient resting quietly in room. No noted distress or abnormal behaviors noted. Will continue 15 minute checks and observation by security camera for safety. 

## 2016-02-16 NOTE — ED Notes (Signed)
Patient alert and oriented. Offers no complaints. Patient received breakfast tray and beverage. Maintained on 15 minute checks and observation by security camera for safety.

## 2016-02-16 NOTE — ED Provider Notes (Signed)
-----------------------------------------   7:45 AM on 02/16/2016 -----------------------------------------   Blood pressure 135/67, pulse (!) 58, temperature 98.3 F (36.8 C), temperature source Oral, resp. rate 18, height 5' 11.5" (1.816 m), weight 262 lb (118.8 kg), SpO2 97 %.  The patient had no acute events since last update.  Calm and cooperative at this time.  Disposition is pending Psychiatry/Behavioral Medicine team recommendations.     Willy EddyPatrick Kadedra Vanaken, MD 02/16/16 [phone]

## 2016-02-17 NOTE — ED Notes (Signed)
Patient took a shower. Patient frequently standing at the nursing station with numerous requests for food, drinks. Gets very irritated when requests are delayed when nurses are busy. Maintained on 15 minute checks and observation by security camera for safety.

## 2016-02-17 NOTE — ED Notes (Signed)
Patient noted in room. No complaints, stable, in no acute distress. Q15 minute rounds and monitoring via Security Cameras to continue.  

## 2016-02-17 NOTE — ED Notes (Signed)
ED BHU PLACEMENT JUSTIFICATION Is the patient under IVC or is there intent for IVC: Yes.   Is the patient medically cleared: Yes.   Is there vacancy in the ED BHU: Yes.   Is the population mix appropriate for patient: Yes.   Is the patient awaiting placement in inpatient or outpatient setting: Yes.   Has the patient had a psychiatric consult: Yes.   Survey of unit performed for contraband, proper placement and condition of furniture, tampering with fixtures in bathroom, shower, and each patient room: Yes.   APPEARANCE/BEHAVIOR calm, cooperative and adequate rapport can be established NEURO ASSESSMENT Orientation: time, place and person Hallucinations: Yes.  None noted (Hallucinations) Speech: Normal Gait: normal RESPIRATORY ASSESSMENT Normal expansion.  Clear to auscultation.  No rales, rhonchi, or wheezing. CARDIOVASCULAR ASSESSMENT regular rate and rhythm, S1, S2 normal, no murmur, click, rub or gallop GASTROINTESTINAL ASSESSMENT soft, nontender, BS WNL, no r/g EXTREMITIES normal strength, tone, and muscle mass PLAN OF CARE Provide calm/safe environment. Vital signs assessed twice daily. ED BHU Assessment once each 12-hour shift. Collaborate with intake RN daily or as condition indicates. Assure the ED provider has rounded once each shift. Provide and encourage hygiene. Provide redirection as needed. Assess for escalating behavior; address immediately and inform ED provider.  Assess family dynamic and appropriateness for visitation as needed: Yes.   Educate the patient/family about BHU procedures/visitation: Yes.

## 2016-02-17 NOTE — ED Notes (Signed)
Patient offers no complaints. Given lunch tray. Maintained on 15 minute checks and observation by security camera for safety.

## 2016-02-17 NOTE — ED Notes (Signed)
Patient resting quietly in room. No noted distress or abnormal behaviors noted. Will continue 15 minute checks and observation by security camera for safety. 

## 2016-02-17 NOTE — ED Notes (Signed)
Patient out in common area, although not noted to be talking to other patients.  Maintained on 15 minute checks and observation by security camera for safety.

## 2016-02-17 NOTE — ED Notes (Signed)
Patient awake, alert, and oriented. Received breakfast tray. Maintained on 15 minute checks and observation by security camera for safety. 

## 2016-02-17 NOTE — ED Provider Notes (Signed)
-----------------------------------------   5:59 AM on 02/17/2016 -----------------------------------------   Blood pressure 140/75, pulse 75, temperature 98 F (36.7 C), temperature source Oral, resp. rate 18, height 5' 11.5" (1.816 m), weight 262 lb (118.8 kg), SpO2 97 %.  The patient had no acute events since last update.  Calm and cooperative at this time.  Disposition is pending Psychiatry/Behavioral Medicine team recommendations.     Myrna Blazeravid Matthew Tkai Large, MD 02/17/16 [phone]

## 2016-02-17 NOTE — ED Notes (Signed)

## 2016-02-17 NOTE — ED Notes (Signed)
Report was received by; Pt. Verbalizes no complaints or distress; denies S.I./Hi. Continue to monitor with 15 min. Monitoring. 

## 2016-02-17 NOTE — Progress Notes (Signed)
LCSW called [facility] and patient is still on wait list as per [employer]Barb   Laquasia Pincus LCSW [phone]

## 2016-02-17 NOTE — ED Notes (Signed)
IVC/Pending Placement 

## 2016-02-17 NOTE — ED Notes (Signed)
Patient received supper tray. 

## 2016-02-18 DIAGNOSIS — F200 Paranoid schizophrenia: Secondary | ICD-10-CM | POA: Diagnosis not present

## 2016-02-18 NOTE — ED Provider Notes (Signed)
-----------------------------------------   7:20 AM on 02/18/2016 -----------------------------------------   Blood pressure 125/69, pulse 68, temperature 98.3 F (36.8 C), temperature source Oral, resp. rate 16, height 5' 11.5" (1.816 m), weight 262 lb (118.8 kg), SpO2 98 %.  The patient had no acute events since last update.  Calm and cooperative at this time.  Disposition is pending Psychiatry/Behavioral Medicine team recommendations.     Jeanmarie PlantJames A Tanysha Quant, MD 02/18/16 [phone]

## 2016-02-18 NOTE — Consult Note (Signed)
[facility] Face-to-Face Psychiatry Consult   Reason for Consult:  Consult follow-up for 50 year old man with schizophrenia currently in the emergency room Referring Physician:  Inocencio [name] Patient Identification: Kenneth [name] MRN:  [id number] Principal Diagnosis: Schizophrenia, paranoid [facility](HCC) Diagnosis:   Patient Active Problem List   Diagnosis Date Noted  . Tobacco use disorder [F17.200] 02/05/2016  . Schizophrenia, paranoid (HCC) [F20.0] 01/26/2016  . Antisocial personality disorder [F60.2] 01/23/2016  . Noncompliance [Z91.19] 01/23/2016    Total Time spent with patient: 20 minutes   Follow-up for Monday the ninth. On interview today the patient seems to be doing better. He doesn't launch into talking about gang membership or killing the illuminati. Instead he has what sounds like a potentially lucid plan to take care of himself in [place]. Denies any current suicidal or homicidal ideation. Behavior has mostly been calm for a few days and he appears to be compliant with medicine.  Subjective:   Kenneth [name] is a 50 y.o. male patient admitted with "no I only killed illuminati".  HPI:  Patient interviewed. Chart reviewed. This 50 year old man has been here in the emergency room for some time waiting for appropriate disposition. He had been determined to be unlikely to benefit from further treatment on the inpatient unit here and we are [facility]. Patient requested to speak to me at length today. The conversation was largely based around multiple delusions of his. He was calm and discussing them but talked about various people that he either had killed in the pastor wanted to kill in the future. The content was grandiose and delusional. His request was that we release him but his plans for how he would take care of himself were unrealistic and probably delusionally based. Patient is not showing any sign of akathisia or stiffness. He has been eating and his  vital signs are normal.  Past Psychiatric History: Long history of schizophrenia also history of antisocial behavior with multiple hospitalizations as well as multiple imprisonments. Has been on a wide range of medicines in the past. Reportedly did well on clozapine at one point but he states to me that he will not take it again because of side effects. Patient talks a lot about past suicidal and homicidal behavior but it's hard to know how much of it is delusional.  Risk to Self: Suicidal Ideation: Yes-Currently Present Suicidal Intent: No Is patient at risk for suicide?: Yes Suicidal Plan?: No Access to Means: No What has been your use of drugs/alcohol within the last 12 months?: None Reported How many times?: 1 (2010) Other Self Harm Risks: None Reported Triggers for Past Attempts: None known Intentional Self Injurious Behavior: None Risk to Others: Homicidal Ideation: Yes-Currently Present Thoughts of Harm to Others: No Comment - Thoughts of Harm to Others:  (Patient makes vague statements of HI towards others.) Current Homicidal Intent: No Current Homicidal Plan: No Access to Homicidal Means: No Identified Victim: None Reported History of harm to others?: No Assessment of Violence: None Noted Violent Behavior Description: None Reported Does patient have access to weapons?: No Criminal Charges Pending?: No Does patient have a court date: No Prior Inpatient Therapy: Prior Inpatient Therapy: Yes Prior Therapy Dates: 01/2016 [facility] Reason for Treatment: Schizoohrenia  Prior Outpatient Therapy: Prior Outpatient Therapy: No Prior Therapy Dates: N/A Prior Therapy Facilty/Provider(s): N/A Reason for Treatment: N/A Does patient have an ACCT team?: No Does patient have Intensive In-House Services?  : No Does patient have [facility] services? :  No Does patient have P4CC services?: No  Past Medical History:  Past Medical History:  Diagnosis Date  .  Anxiety   . Hypercholesteremia   . Hypertension   . Schizophrenia [facility])     Past Surgical History:  Procedure Laterality Date  . CIRCUMCISION, NON-NEWBORN     Family History: No family history on file. Family Psychiatric  History: Positive for psychosis Social History:  History  Alcohol Use  . Yes    Comment: social drinker     History  Drug Use No    Social History   Social History  . Marital status: Legally Separated    Spouse name: N/A  . Number of children: N/A  . Years of education: N/A   Social History Main Topics  . Smoking status: Current Every Day Smoker    Packs/day: 2.00    Types: Cigarettes  . Smokeless tobacco: Never Used  . Alcohol use Yes     Comment: social drinker  . Drug use: No  . Sexual activity: Not Asked   Other Topics Concern  . None   Social History Narrative  . None   Additional Social History:    Allergies:   Allergies  Allergen Reactions  . Haldol [Haloperidol] Other (See Comments)    "lock up"  . Loxapine     "Locks ups"  . Penicillins Other (See Comments)    Neck swells Has patient had a PCN reaction causing immediate rash, facial/tongue/throat swelling, SOB or lightheadedness with hypotension: yes Has patient had a PCN reaction causing severe rash involving mucus membranes or skin necrosis: no Has patient had a PCN reaction that required hospitalization:  no Has patient had a PCN reaction occurring within the last 10 years: no If all of the above answers are "NO", then may proceed with Cephalosporin use.     Labs: No results found for this or any previous visit (from the past 48 hour(s)).  Current Facility-Administered Medications  Medication Dose Route Frequency Provider Last Rate Last Dose  . perphenazine (TRILAFON) tablet 12 mg  12 mg Oral TID Audery Amel, MD   12 mg at 02/18/16 1623   Current Outpatient Prescriptions  Medication Sig Dispense Refill  . OLANZapine (ZYPREXA) 10 MG tablet Take 30 mg by mouth at  bedtime.    Marland Kitchen perphenazine (TRILAFON) 8 MG tablet Take 1 tablet (8 mg total) by mouth 3 (three) times daily. 90 tablet 3    Musculoskeletal: Strength & Muscle Tone: within normal limits Gait & Station: normal Patient leans: N/A  Psychiatric Specialty Exam: Physical Exam  Nursing note and vitals reviewed. Constitutional: He appears well-developed and well-nourished.  HENT:  Head: Normocephalic and atraumatic.  Eyes: Conjunctivae are normal. Pupils are equal, round, and reactive to light.  Neck: Normal range of motion.  Cardiovascular: Regular rhythm and normal heart sounds.   Respiratory: Effort normal. No respiratory distress.  GI: Soft.  Musculoskeletal: Normal range of motion.  Neurological: He is alert.  Skin: Skin is warm and dry.  Psychiatric: His speech is normal and behavior is normal. His affect is blunt. Thought content is not paranoid and not delusional. Cognition and memory are impaired. He expresses impulsivity. He expresses no homicidal ideation.    Review of Systems  Constitutional: Negative.   HENT: Negative.   Eyes: Negative.   Respiratory: Negative.   Cardiovascular: Negative.   Gastrointestinal: Negative.   Musculoskeletal: Negative.   Skin: Negative.   Neurological: Negative.   Psychiatric/Behavioral: Negative for depression, hallucinations,  memory loss, substance abuse and suicidal ideas. The patient is nervous/anxious. The patient does not have insomnia.     Blood pressure (!) 146/77, pulse 75, temperature 98.1 F (36.7 C), temperature source Oral, resp. rate 17, height 5' 11.5" (1.816 m), weight 118.8 kg (262 lb), SpO2 97 %.Body mass index is 36.03 kg/m.  General Appearance: Fairly Groomed  Eye Contact:  Fair  Speech:  Slow  Volume:  Decreased  Mood:  Dysphoric  Affect:  Constricted  Thought Process:  Goal Directed  Orientation:  Full (Time, Place, and Person)  Thought Content:  Tangential  Suicidal Thoughts:  No  Homicidal Thoughts:  No   Memory:  Immediate;   Good Recent;   Fair Remote;   Fair  Judgement:  Impaired  Insight:  Shallow  Psychomotor Activity:  Normal  Concentration:  Concentration: Fair  Recall:  [employer] of Knowledge:  Fair  Language:  Fair  Akathisia:  No  Handed:  Right  AIMS (if indicated):     Assets:  Communication Skills Desire for Improvement Physical Health Resilience  ADL's:  Intact  Cognition:  Impaired,  Mild  Sleep:        Treatment Plan Summary: Daily contact with patient to assess and evaluate symptoms and progress in treatment, Medication management and Plan Patient will possibly be considered for discharge tomorrow if we can make some kind of plan. I'm still very worried that he will come right back to the emergency room but right now at least he is lucid and probably responding to his medication. We will review it here in the emergency room with TTS and social work and reevaluate tomorrow. Patient advised of the plan and is appreciative. No change to medicine for today.  Disposition: Recommend psychiatric Inpatient admission when medically cleared.  Mordecai Rasmussen, MD 02/18/2016 4:28 PM

## 2016-02-18 NOTE — ED Notes (Signed)
Pt showered

## 2016-02-18 NOTE — ED Notes (Signed)
Pt has been cooperative this a.m., however he has not been receptive to questioning. "I don't want to talk about that," he says, when asked about if he's having thoughts of self harm. Will continue to monitor for needs/safety.

## 2016-02-18 NOTE — Progress Notes (Signed)
Per Junious Dresseronnie at [facility] pt remains on wait list. CSW will consult with MD tomorrow about pt's discharge plan. CSW will continue to follow pt and assist as needed.  Jonathon JordanLynn B Georgi Tuel, MSW, Theresia MajorsLCSWA [phone]

## 2016-02-18 NOTE — ED Notes (Signed)
Breakfast tray given with extra whole milk. Coke given earlier.

## 2016-02-18 NOTE — ED Notes (Signed)
Pt is alert and oriented this evening. Pt states that he is looking forward to going [place] shelter [place]. Pt mood is appropriate and he is pleasant and cooperative with staff. Pt denies SI/HI and AVH. Writer discussed tx plan and 15 minute checks are ongoing for safety.

## 2016-02-18 NOTE — ED Notes (Signed)
[facility] wait List

## 2016-02-18 NOTE — Progress Notes (Signed)
CSW met with pt about current discharge plan. CSW explained that pt is still on the wait list at [facility] and per Marlowe Kays [phone]) the wait could be well over a week. Pt expressed frustration and states that he would like to be discharged to a shelter in [place] (pt has been banned from local shelters). CSW can connect pt to [facility] or [facility]. Pt reports that he will be med compliant upon d/c and utilize oupt services because he would like to prevent another ED admission. Pt also denies SI/HI at this time. CSW will consult with psychiatrist and see if he is willing to reassess pt.   Georga Kaufmann, MSW, [facility] ED Clinical Social Worker [phone]

## 2016-02-19 NOTE — ED Notes (Signed)
Pt is alert and oriented this evening. Pt mood is sad/depressed but he is cooperative with staff. Writer provided food and drink, medications and 15 minute checks are ongoing for safety. Pt denies SI/HI and AVH at this time.

## 2016-02-19 NOTE — Consult Note (Signed)
[facility]   Reason for Consult:  Consult follow-up for 50 year old man with schizophrenia currently in the emergency room Referring Physician:  Inocencio [name] Patient Identification: Kenneth [name] MRN:  [id number] Principal Diagnosis: Schizophrenia, paranoid [facility](HCC) Diagnosis:   Patient Active Problem List   Diagnosis Date Noted  . Tobacco use disorder [F17.200] 02/05/2016  . Schizophrenia, paranoid (HCC) [F20.0] 01/26/2016  . Antisocial personality disorder [F60.2] 01/23/2016  . Noncompliance [Z91.19] 01/23/2016    Total Time spent with patient: 20 minutes   Follow-up for Tuesday, October 10. Patient is continuing to beg for discharge. He continues to seem like his psychosis is under better control. Gets a little agitated but has not been threatening or violent.  Subjective:   Kenneth [name] is a 50 y.o. male patient admitted with "no I only killed illuminati".  HPI:  Patient interviewed. Chart reviewed. This 50 year old man has been here in the emergency room for some time waiting for appropriate disposition. He had been determined to be unlikely to benefit from further treatment on the inpatient unit here and we are [facility]. Patient requested to speak to me at length today. The conversation was largely based around multiple delusions of his. He was calm and discussing them but talked about various people that he either had killed in the pastor wanted to kill in the future. The content was grandiose and delusional. His request was that we release him but his plans for [name] he would take care of himself were unrealistic and probably delusionally based. Patient is not showing any sign of akathisia or stiffness. He has been eating and his vital signs are normal.  Past Psychiatric History: Long history of schizophrenia also history of antisocial behavior with multiple hospitalizations as well as multiple imprisonments. Has been on a  wide range of medicines in the past. Reportedly did well on clozapine at one point but he states to me that he will not take it again because of side effects. Patient talks a lot about past suicidal and homicidal behavior but it's hard to know [name] much of it is delusional.  Risk to Self: Suicidal Ideation: Yes-Currently Present Suicidal Intent: No Is patient at risk for suicide?: Yes Suicidal Plan?: No Access to Means: No What has been your use of drugs/alcohol within the last 12 months?: None Reported [name] many times?: 1 (2010) Other Self Harm Risks: None Reported Triggers for Past Attempts: None known Intentional Self Injurious Behavior: None Risk to Others: Homicidal Ideation: Yes-Currently Present Thoughts of Harm to Others: No Comment - Thoughts of Harm to Others:  (Patient makes vague statements of HI towards others.) Current Homicidal Intent: No Current Homicidal Plan: No Access to Homicidal Means: No Identified Victim: None Reported History of harm to others?: No Assessment of Violence: None Noted Violent Behavior Description: None Reported Does patient have access to weapons?: No Criminal Charges Pending?: No Does patient have a court date: No Prior Inpatient Therapy: Prior Inpatient Therapy: Yes Prior Therapy Dates: 01/2016 [facility] Reason for Treatment: Schizoohrenia  Prior Outpatient Therapy: Prior Outpatient Therapy: No Prior Therapy Dates: N/A Prior Therapy Facilty/Provider(s): N/A Reason for Treatment: N/A Does patient have an ACCT team?: No Does patient have Intensive In-House Services?  : No Does patient have [facility] services? : No Does patient have P4CC services?: No  Past Medical History:  Past Medical History:  Diagnosis Date  . Anxiety   . Hypercholesteremia   . Hypertension   . Schizophrenia (  [facility])     Past Surgical History:  Procedure Laterality Date  . CIRCUMCISION, NON-NEWBORN     Family History: No family history  on file. Family Psychiatric  History: Positive for psychosis Social History:  History  Alcohol Use  . Yes    Comment: social drinker     History  Drug Use No    Social History   Social History  . Marital status: Legally Separated    Spouse name: N/A  . Number of children: N/A  . Years of education: N/A   Social History Main Topics  . Smoking status: Current Every Day Smoker    Packs/day: 2.00    Types: Cigarettes  . Smokeless tobacco: Never Used  . Alcohol use Yes     Comment: social drinker  . Drug use: No  . Sexual activity: Not Asked   Other Topics Concern  . None   Social History Narrative  . None   Additional Social History:    Allergies:   Allergies  Allergen Reactions  . Haldol [Haloperidol] Other (See Comments)    "lock up"  . Loxapine     "Locks ups"  . Penicillins Other (See Comments)    Neck swells Has patient had a PCN reaction causing immediate rash, facial/tongue/throat swelling, SOB or lightheadedness with hypotension: yes Has patient had a PCN reaction causing severe rash involving mucus membranes or skin necrosis: no Has patient had a PCN reaction that required hospitalization:  no Has patient had a PCN reaction occurring within the last 10 years: no If all of the above answers are "NO", then may proceed with Cephalosporin use.     Labs: No results found for this or any previous visit (from the past 48 hour(s)).  Current Facility-Administered Medications  Medication Dose Route Frequency Provider Last Rate Last Dose  . perphenazine (TRILAFON) tablet 12 mg  12 mg Oral TID Audery Amel, MD   12 mg at 02/19/16 1605   Current Outpatient Prescriptions  Medication Sig Dispense Refill  . OLANZapine (ZYPREXA) 10 MG tablet Take 30 mg by mouth at bedtime.    Marland Kitchen perphenazine (TRILAFON) 8 MG tablet Take 1 tablet (8 mg total) by mouth 3 (three) times daily. 90 tablet 3    Musculoskeletal: Strength & Muscle Tone: within normal limits Gait &  Station: normal Patient leans: N/A  Psychiatric Specialty Exam: Physical Exam  Nursing note and vitals reviewed. Constitutional: He appears well-developed and well-nourished.  HENT:  Head: Normocephalic and atraumatic.  Eyes: Conjunctivae are normal. Pupils are equal, round, and reactive to light.  Neck: Normal range of motion.  Cardiovascular: Regular rhythm and normal heart sounds.   Respiratory: Effort normal. No respiratory distress.  GI: Soft.  Musculoskeletal: Normal range of motion.  Neurological: He is alert.  Skin: Skin is warm and dry.  Psychiatric: His speech is normal and behavior is normal. His affect is blunt. Thought content is not paranoid and not delusional. Cognition and memory are impaired. He expresses impulsivity. He expresses no homicidal ideation.    Review of Systems  Constitutional: Negative.   HENT: Negative.   Eyes: Negative.   Respiratory: Negative.   Cardiovascular: Negative.   Gastrointestinal: Negative.   Musculoskeletal: Negative.   Skin: Negative.   Neurological: Negative.   Psychiatric/Behavioral: Negative for depression, hallucinations, memory loss, substance abuse and suicidal ideas. The patient is nervous/anxious. The patient does not have insomnia.     Blood pressure (!) 113/52, pulse 75, temperature 98.4 F (36.9 C), temperature  source Oral, resp. rate 20, height 5' 11.5" (1.816 m), weight 118.8 kg (262 lb), SpO2 97 %.Body mass index is 36.03 kg/m.  General Appearance: Fairly Groomed  Eye Contact:  Fair  Speech:  Slow  Volume:  Decreased  Mood:  Dysphoric  Affect:  Constricted  Thought Process:  Goal Directed  Orientation:  Full (Time, Place, and Person)  Thought Content:  Tangential  Suicidal Thoughts:  No  Homicidal Thoughts:  No  Memory:  Immediate;   Good Recent;   Fair Remote;   Fair  Judgement:  Impaired  Insight:  Shallow  Psychomotor Activity:  Normal  Concentration:  Concentration: Fair  Recall:  [employer] of  Knowledge:  Fair  Language:  Fair  Akathisia:  No  Handed:  Right  AIMS (if indicated):     Assets:  Communication Skills Desire for Improvement Physical Health Resilience  ADL's:  Intact  Cognition:  Impaired,  Mild  Sleep:        Treatment Plan Summary: Daily contact with patient to assess and evaluate symptoms and progress in treatment, Medication management and Plan I reviewed the situation with social work and TTS today. There is really no good way to ensure that we can transfer him to [facility] given the local transportation options and he has already proven himself to be irresponsible and it comes to doing it on his own. Until we can find a safer and more definite option for disposition I'm going to continue to suggest [facility]. We will continue to work with him daily and look into any other options that may arise.  Disposition: Recommend psychiatric Inpatient admission when medically cleared.  Mordecai Rasmussen, MD 02/19/2016 8:16 PM

## 2016-02-19 NOTE — Progress Notes (Signed)
[facility] [phone]). Pt remains on the wait list.  As a back-up CSW is currently working on a d/c plan for pt. Pt has expressed an interest in going to a shelter in [place]. CSW will explore options today and see if this is a plausible plan.  Kenneth JordanLynn B Amandine [name], MSW, Theresia MajorsLCSWA [phone]

## 2016-02-19 NOTE — ED Provider Notes (Signed)
-----------------------------------------   6:41 AM on 02/19/2016 -----------------------------------------   Blood pressure 124/78, pulse 75, temperature 98 F (36.7 C), temperature source Oral, resp. rate 16, height 5' 11.5" (1.816 m), weight 262 lb (118.8 kg), SpO2 97 %.  The patient had no acute events since last update.  Calm and cooperative at this time.  Disposition is pending Psychiatry/Behavioral Medicine team recommendations.     Irean HongJade J Sung, MD 02/19/16 [phone]

## 2016-02-19 NOTE — ED Notes (Signed)
PT  [facility]  / [facility] WAIT LIST   IVC  NEEDS  TO BE RENEWED  ON 02/21/16

## 2016-02-20 MED ORDER — PERPHENAZINE 8 MG PO TABS: 12.0000 mg | ORAL_TABLET | Freq: Three times a day (TID) | ORAL | 1 refills | Status: DC

## 2016-02-20 MED ORDER — PERPHENAZINE 8 MG PO TABS: 12.0000 mg | ORAL_TABLET | Freq: Three times a day (TID) | ORAL | 0 refills | Status: DC

## 2016-02-20 NOTE — Discharge Instructions (Signed)
Return to the emergency department for any altered mental status, violent behavior, thoughts of wanting to hurt herself or others, or any other symptoms concerning to you.

## 2016-02-20 NOTE — ED Provider Notes (Signed)
-----------------------------------------   7:33 AM on 02/20/2016 -----------------------------------------  Vitals:   02/19/16 2053 02/20/16 0635  BP: 117/77 121/73  Pulse: 89 70  Resp: 20 18  Temp: 98.5 F (36.9 C) 98.6 F (37 C)   No acute events reported overnight to me.  Patient is still under IVC, awaiting psychiatric disposition.   Governor Rooksebecca Narayan Scull, MD 02/20/16 [phone]

## 2016-02-20 NOTE — ED Provider Notes (Signed)
This patient has been reevaluated by Dr. Toni Amendlapacs who released this patient from involuntary commitment. He apparently does not have access to the homeless shelter [place] in [place], and requests to go to [facility] and stay at the shelter there. Social work will apparently help him obtain a bus pass in order to get there.  I filled out his discharge instructions.   Governor Rooksebecca Cricket Goodlin, MD 02/20/16 [phone]

## 2016-02-20 NOTE — Consult Note (Signed)
[facility] Face-to-Face Psychiatry Consult   Reason for Consult:  Consult follow-up for 50 year old man with schizophrenia currently in the emergency room Referring Physician:  Inocencio Homes Patient Identification: Kenneth [name] MRN:  [id number] Principal Diagnosis: Schizophrenia, paranoid [facility]) Diagnosis:   Patient Active Problem List   Diagnosis Date Noted  . Tobacco use disorder [F17.200] 02/05/2016  . Schizophrenia, paranoid (HCC) [F20.0] 01/26/2016  . Antisocial personality disorder [F60.2] 01/23/2016  . Noncompliance [Z91.19] 01/23/2016    Total Time spent with patient: 20 minutes   This is a follow-up on Wednesday the 11th for this 50 year old man with schizophrenia. Patient seen. Full emergency room treatment team including nursing and social work and TTS bed and discussed potential plans. Patient has requested to go back to [facility] where he plans to stay in the shelter and to follow-up with local mental health there. He has said that he will continue to take his antipsychotic as he has been in the emergency room. Patient has been counseled about the dangers of noncompliance and substance abuse and failure to follow through with the plan in that it is likely to bring him straight back to the hospital. At this point we seem to be little closer to getting him to [facility]. I have agreed to a plan that will involve social work or TTS or security escorting the patient directly to the bus and paying his fair which will allow him to travel all the way to [place]. He will be given a 7 day supply of his medicine plus a 30 days supply prescription.  Subjective:   Kenneth [name] is a 50 y.o. male patient admitted with "no I only killed illuminati".  HPI:  Patient interviewed. Chart reviewed. This 50 year old man has been here in the emergency room for some time waiting for appropriate disposition. He had been determined to be unlikely to benefit from further treatment on the inpatient  unit here and we are awaiting referral to [facility]. Patient requested to speak to me at length today. The conversation was largely based around multiple delusions of his. He was calm and discussing them but talked about various people that he either had killed in the pastor wanted to kill in the future. The content was grandiose and delusional. His request was that we release him but his plans for how he would take care of himself were unrealistic and probably delusionally based. Patient is not showing any sign of akathisia or stiffness. He has been eating and his vital signs are normal.  Past Psychiatric History: Long history of schizophrenia also history of antisocial behavior with multiple hospitalizations as well as multiple imprisonments. Has been on a wide range of medicines in the past. Reportedly did well on clozapine at one point but he states to me that he will not take it again because of side effects. Patient talks a lot about past suicidal and homicidal behavior but it's hard to know how much of it is delusional.  Risk to Self: Suicidal Ideation: Yes-Currently Present Suicidal Intent: No Is patient at risk for suicide?: Yes Suicidal Plan?: No Access to Means: No What has been your use of drugs/alcohol within the last 12 months?: None Reported How many times?: 1 (2010) Other Self Harm Risks: None Reported Triggers for Past Attempts: None known Intentional Self Injurious Behavior: None Risk to Others: Homicidal Ideation: Yes-Currently Present Thoughts of Harm to Others: No Comment - Thoughts of Harm to Others:  (Patient makes vague statements of HI towards others.)  Current Homicidal Intent: No Current Homicidal Plan: No Access to Homicidal Means: No Identified Victim: None Reported History of harm to others?: No Assessment of Violence: None Noted Violent Behavior Description: None Reported Does patient have access to weapons?: No Criminal Charges Pending?:  No Does patient have a court date: No Prior Inpatient Therapy: Prior Inpatient Therapy: Yes Prior Therapy Dates: 01/2016 Prior Therapy Facilty/Provider(s): [facility] Reason for Treatment: Schizoohrenia  Prior Outpatient Therapy: Prior Outpatient Therapy: No Prior Therapy Dates: N/A Prior Therapy Facilty/Provider(s): N/A Reason for Treatment: N/A Does patient have an ACCT team?: No Does patient have Intensive In-House Services?  : No Does patient have [facility] services? : No Does patient have P4CC services?: No  Past Medical History:  Past Medical History:  Diagnosis Date  . Anxiety   . Hypercholesteremia   . Hypertension   . Schizophrenia [facility])     Past Surgical History:  Procedure Laterality Date  . CIRCUMCISION, NON-NEWBORN     Family History: No family history on file. Family Psychiatric  History: Positive for psychosis Social History:  History  Alcohol Use  . Yes    Comment: social drinker     History  Drug Use No    Social History   Social History  . Marital status: Legally Separated    Spouse name: N/A  . Number of children: N/A  . Years of education: N/A   Social History Main Topics  . Smoking status: Current Every Day Smoker    Packs/day: 2.00    Types: Cigarettes  . Smokeless tobacco: Never Used  . Alcohol use Yes     Comment: social drinker  . Drug use: No  . Sexual activity: Not Asked   Other Topics Concern  . None   Social History Narrative  . None   Additional Social History:    Allergies:   Allergies  Allergen Reactions  . Haldol [Haloperidol] Other (See Comments)    "lock up"  . Loxapine     "Locks ups"  . Penicillins Other (See Comments)    Neck swells Has patient had a PCN reaction causing immediate rash, facial/tongue/throat swelling, SOB or lightheadedness with hypotension: yes Has patient had a PCN reaction causing severe rash involving mucus membranes or skin necrosis: no Has patient had a PCN reaction that required  hospitalization:  no Has patient had a PCN reaction occurring within the last 10 years: no If all of the above answers are "NO", then may proceed with Cephalosporin use.     Labs: No results found for this or any previous visit (from the past 48 hour(s)).  Current Facility-Administered Medications  Medication Dose Route Frequency Provider Last Rate Last Dose  . perphenazine (TRILAFON) tablet 12 mg  12 mg Oral TID Audery Amel, MD   12 mg at 02/20/16 1010   Current Outpatient Prescriptions  Medication Sig Dispense Refill  . OLANZapine (ZYPREXA) 10 MG tablet Take 30 mg by mouth at bedtime.    Marland Kitchen perphenazine (TRILAFON) 8 MG tablet Take 1 tablet (8 mg total) by mouth 3 (three) times daily. 90 tablet 3  . perphenazine (TRILAFON) 8 MG tablet Take 1.5 tablets (12 mg total) by mouth 3 (three) times daily. 135 tablet 1    Musculoskeletal: Strength & Muscle Tone: within normal limits Gait & Station: normal Patient leans: N/A  Psychiatric Specialty Exam: Physical Exam  Nursing note and vitals reviewed. Constitutional: He appears well-developed and well-nourished.  HENT:  Head: Normocephalic and atraumatic.  Eyes: Conjunctivae are normal.  Pupils are equal, round, and reactive to light.  Neck: Normal range of motion.  Cardiovascular: Regular rhythm and normal heart sounds.   Respiratory: Effort normal. No respiratory distress.  GI: Soft.  Musculoskeletal: Normal range of motion.  Neurological: He is alert.  Skin: Skin is warm and dry.  Psychiatric: His speech is normal and behavior is normal. His affect is blunt. Thought content is not paranoid and not delusional. Cognition and memory are impaired. He expresses impulsivity. He expresses no homicidal ideation.    Review of Systems  Constitutional: Negative.   HENT: Negative.   Eyes: Negative.   Respiratory: Negative.   Cardiovascular: Negative.   Gastrointestinal: Negative.   Musculoskeletal: Negative.   Skin: Negative.    Neurological: Negative.   Psychiatric/Behavioral: Negative for depression, hallucinations, memory loss, substance abuse and suicidal ideas. The patient is nervous/anxious. The patient does not have insomnia.     Blood pressure 121/73, pulse 70, temperature 98.6 F (37 C), temperature source Oral, resp. rate 18, height 5' 11.5" (1.816 m), weight 118.8 kg (262 lb), SpO2 96 %.Body mass index is 36.03 kg/m.  General Appearance: Fairly Groomed  Eye Contact:  Fair  Speech:  Slow  Volume:  Decreased  Mood:  Dysphoric  Affect:  Constricted  Thought Process:  Goal Directed  Orientation:  Full (Time, Place, and Person)  Thought Content:  Tangential  Suicidal Thoughts:  No  Homicidal Thoughts:  No  Memory:  Immediate;   Good Recent;   Fair Remote;   Fair  Judgement:  Impaired  Insight:  Shallow  Psychomotor Activity:  Normal  Concentration:  Concentration: Fair  Recall:  [employer]  Fund of Knowledge:  Fair  Language:  Fair  Akathisia:  No  Handed:  Right  AIMS (if indicated):     Assets:  Communication Skills Desire for Improvement Physical Health Resilience  ADL's:  Intact  Cognition:  Impaired,  Mild  Sleep:        Treatment Plan Summary: Daily contact with patient to assess and evaluate symptoms and progress in treatment, Medication management and Plan As noted above. Case reviewed with emergency room physician. Discontinue the involuntary commitment. Patient will be given 7 day supply of medicine plus a 30 day prescription with a refill. He will be escorted to the bus and is planning to [facility] and [facility].  Disposition: Recommend psychiatric Inpatient admission when medically cleared.  Mordecai RasmussenJohn Bronda Alfred, MD 02/20/2016 2:20 PM

## 2016-02-20 NOTE — ED Notes (Signed)
Pt reports that [facility] not [facility], feels as though he is "doing better", and reports that he would rather be discharged from here than go [facility], "[facility] ain't gonna do me no good", pt however is not able to articulate a discharge plan other than, "[facility]".

## 2016-02-20 NOTE — ED Notes (Signed)
Writer took patient 7 day supply prescription to pharmacy.

## 2016-02-20 NOTE — Progress Notes (Signed)
Pt will d/c today to [facility]. CSW called [facility] [phone]. There is a male bed available and they will be expecting pt's arrival today.   CSW provided pt with detailed instructions on bus route to [facility] and will also provide pt with $5.75 for the bus fare. CSW also provided pt with a list of outpt mental health providers that he can follow-up with in [place].  D/C plan has been discussed with pt and he is agreeable.   Jonathon JordanLynn B Chayson Charters, MSW, Theresia MajorsLCSWA [phone]

## 2016-09-16 ENCOUNTER — Encounter: Payer: Self-pay | Admitting: Emergency Medicine

## 2016-09-16 ENCOUNTER — Emergency Department
Admission: EM | Admit: 2016-09-16 | Discharge: 2016-09-16 | Disposition: A | Discharge status: Home / Self Care | Payer: No Typology Code available for payment source | Attending: Emergency Medicine | Admitting: Emergency Medicine

## 2016-09-16 DIAGNOSIS — F17210 Nicotine dependence, cigarettes, uncomplicated: Secondary | ICD-10-CM | POA: Diagnosis not present

## 2016-09-16 DIAGNOSIS — F918 Other conduct disorders: Secondary | ICD-10-CM | POA: Diagnosis present

## 2016-09-16 DIAGNOSIS — F22 Delusional disorders: Secondary | ICD-10-CM

## 2016-09-16 DIAGNOSIS — Z79899 Other long term (current) drug therapy: Secondary | ICD-10-CM | POA: Insufficient documentation

## 2016-09-16 DIAGNOSIS — I10 Essential (primary) hypertension: Secondary | ICD-10-CM | POA: Diagnosis not present

## 2016-09-16 DIAGNOSIS — F200 Paranoid schizophrenia: Secondary | ICD-10-CM

## 2016-09-16 DIAGNOSIS — F29 Unspecified psychosis not due to a substance or known physiological condition: Secondary | ICD-10-CM | POA: Diagnosis not present

## 2016-09-16 LAB — COMPREHENSIVE METABOLIC PANEL
ALBUMIN: 4.3 g/dL (ref 3.5–5.0)
ALT: 32 U/L (ref 17–63)
AST: 28 U/L (ref 15–41)
Alkaline Phosphatase: 108 U/L (ref 38–126)
Anion gap: 5 (ref 5–15)
BUN: 7 mg/dL (ref 6–20)
CHLORIDE: 99 mmol/L — AB (ref 101–111)
CO2: 30 mmol/L (ref 22–32)
CREATININE: 0.78 mg/dL (ref 0.61–1.24)
Calcium: 9.1 mg/dL (ref 8.9–10.3)
GFR calc Af Amer: >60 mL/min (ref >60)
GFR calc non Af Amer: >60 mL/min (ref >60)
GLUCOSE: 115 mg/dL — AB (ref 65–99)
POTASSIUM: 3.8 mmol/L (ref 3.5–5.1)
Sodium: 134 mmol/L — ABNORMAL LOW (ref 135–145)
Total Bilirubin: 0.7 mg/dL (ref 0.3–1.2)
Total Protein: 7.3 g/dL (ref 6.5–8.1)

## 2016-09-16 LAB — CK: Total CK: 130 U/L (ref 49–397)

## 2016-09-16 LAB — SALICYLATE LEVEL: Salicylate Lvl: <7 mg/dL (ref 2.8–30.0)

## 2016-09-16 LAB — URINE DRUG SCREEN, QUALITATIVE (ARMC ONLY)
AMPHETAMINES, UR SCREEN: NOT DETECTED
BARBITURATES, UR SCREEN: NOT DETECTED
BENZODIAZEPINE, UR SCRN: NOT DETECTED
COCAINE METABOLITE, UR ~~LOC~~: NOT DETECTED
Cannabinoid 50 Ng, Ur ~~LOC~~: NOT DETECTED
MDMA (Ecstasy)Ur Screen: NOT DETECTED
Methadone Scn, Ur: NOT DETECTED
Opiate, Ur Screen: NOT DETECTED
PHENCYCLIDINE (PCP) UR S: NOT DETECTED
Tricyclic, Ur Screen: NOT DETECTED

## 2016-09-16 LAB — ACETAMINOPHEN LEVEL: Acetaminophen (Tylenol), Serum: <10 ug/mL — ABNORMAL LOW (ref 10–30)

## 2016-09-16 LAB — CBC
HEMATOCRIT: 47.4 % (ref 40.0–52.0)
HEMOGLOBIN: 16.1 g/dL (ref 13.0–18.0)
MCH: 30.3 pg (ref 26.0–34.0)
MCHC: 34.1 g/dL (ref 32.0–36.0)
MCV: 88.8 fL (ref 80.0–100.0)
Platelets: 313 10*3/uL (ref 150–440)
RBC: 5.33 MIL/uL (ref 4.40–5.90)
RDW: 14.7 % — AB (ref 11.5–14.5)
WBC: 6.6 10*3/uL (ref 3.8–10.6)

## 2016-09-16 LAB — ETHANOL: Alcohol, Ethyl (B): <5 mg/dL (ref <5)

## 2016-09-16 NOTE — Discharge Instructions (Signed)
Today you were evaluated by our psychiatrist Dr. Toni Amendlapacs who believes it is safe for you to go home. Please continue taking all of your medications as prescribed and follow-up with your psychiatrist as scheduled.  It was a pleasure to take care of you today, and thank you for coming to our emergency department.  If you have any questions or concerns before leaving please ask the nurse to grab me and I'm more than happy to go through your aftercare instructions again.  If you were prescribed any opioid pain medication today such as Norco, Vicodin, Percocet, morphine, hydrocodone, or oxycodone please make sure you do not drive when you are taking this medication as it can alter your ability to drive safely.  If you have any concerns once you are home that you are not improving or are in fact getting worse before you can make it to your follow-up appointment, please do not hesitate to call [phone] and come back for further evaluation.  Merrily BrittleNeil Mauro Arps MD  Results for orders placed or performed during the hospital encounter of 09/16/16  Comprehensive metabolic panel  Result Value Ref Range   Sodium 134 (L) 135 - 145 mmol/L   Potassium 3.8 3.5 - 5.1 mmol/L   Chloride 99 (L) 101 - 111 mmol/L   CO2 30 22 - 32 mmol/L   Glucose, Bld 115 (H) 65 - 99 mg/dL   BUN 7 6 - 20 mg/dL   Creatinine, Ser 1.610.78 0.61 - 1.24 mg/dL   Calcium 9.1 8.9 - 09.610.3 mg/dL   Total Protein 7.3 6.5 - 8.1 g/dL   Albumin 4.3 3.5 - 5.0 g/dL   AST 28 15 - 41 U/L   ALT 32 17 - 63 U/L   Alkaline Phosphatase 108 38 - 126 U/L   Total Bilirubin 0.7 0.3 - 1.2 mg/dL   GFR calc non Af Amer >60 >60 mL/min   GFR calc Af Amer >60 >60 mL/min   Anion gap 5 5 - 15  Ethanol  Result Value Ref Range   Alcohol, Ethyl (B) <5 <5 mg/dL  Salicylate level  Result Value Ref Range   Salicylate Lvl <7.0 2.8 - 30.0 mg/dL  Acetaminophen level  Result Value Ref Range   Acetaminophen (Tylenol), Serum <10 (L) 10 - 30 ug/mL  cbc  Result Value Ref Range   WBC 6.6 3.8 - 10.6 K/uL   RBC 5.33 4.40 - 5.90 MIL/uL   Hemoglobin 16.1 13.0 - 18.0 g/dL   HCT 04.547.4 40.940.0 - 81.152.0 %   MCV 88.8 80.0 - 100.0 fL   MCH 30.3 26.0 - 34.0 pg   MCHC 34.1 32.0 - 36.0 g/dL   RDW 91.414.7 (H) 78.211.5 - 95.614.5 %   Platelets 313 150 - 440 K/uL  Urine Drug Screen, Qualitative  Result Value Ref Range   Tricyclic, Ur Screen NONE DETECTED NONE DETECTED   Amphetamines, Ur Screen NONE DETECTED NONE DETECTED   MDMA (Ecstasy)Ur Screen NONE DETECTED NONE DETECTED   Cocaine Metabolite,Ur [place] NONE DETECTED NONE DETECTED   Opiate, Ur Screen NONE DETECTED NONE DETECTED   Phencyclidine (PCP) Ur S NONE DETECTED NONE DETECTED   Cannabinoid 50 Ng, Ur [place] NONE DETECTED NONE DETECTED   Barbiturates, Ur Screen NONE DETECTED NONE DETECTED   Benzodiazepine, Ur Scrn NONE DETECTED NONE DETECTED   Methadone Scn, Ur NONE DETECTED NONE DETECTED  CK  Result Value Ref Range   Total CK 130 49 - 397 U/L

## 2016-09-16 NOTE — ED Notes (Signed)

## 2016-09-16 NOTE — Consult Note (Signed)
[facility] Consult   Reason for Consult:  Consult for 51 year old man with a history of schizophrenia brought in under involuntary commitment filed by his act team Referring Physician:  Rifenbark Patient Identification: Kenneth [name] MRN:  [id number] Principal Diagnosis: Schizophrenia, paranoid [facility]) Diagnosis:   Patient Active Problem List   Diagnosis Date Noted  . Tobacco use disorder [F17.200] 02/05/2016  . Schizophrenia, paranoid ([place]) [F20.0] 01/26/2016  . Antisocial personality disorder [F60.2] 01/23/2016  . Noncompliance [Z91.19] 01/23/2016    Total Time spent with patient: 1 hour  Subjective:   Kenneth [name] is a 51 y.o. male patient admitted with "I just wanted to know why I can't get my money".  HPI:  Patient interviewed. Chart reviewed. Commitment paper reviewed. Patient familiar from previous encounters. This is a 51 year old man with long-standing schizophrenia. He was petitioned for commitment by a member of his act team with reports that he had been talking about being a Haematologist and had been making vaguely threatening statements. No report that he had specifically threatened or done anything violent to himself or anyone else. On interview today the patient admits that his act team had come by for a scheduled appointment and he got into a conversation with him about his money. Patient believes that he is the owner of the duplex in which she lives and therefore they should be giving him all of his money every month. They are his payees and appeared to be probably distributing his money correctly. However, he denies having any thoughts of actually doing anything to harm anyone and denies any suicidal ideation. He does not refer to himself as a Haematologist. He does occasionally talk about things being "gangster style" which is a common theme in his conversation I have noticed in the past. However that he has been compliant with his medicine and is able to quote  me the correct doses of Trilafon 16 mg twice a day as well as Benadryl and clonazepam. Patient denies any alcohol or drug abuse and he does not have any history of any substance use problems. He was polite and cooperative and remained calm throughout the time I was talking with him and overall seems to be relatively doing quite well especially compared to how he was the last time we saw him in the emergency room.  Social history: I don't believe that he has any family that he remains in contact with. He used to stay in contact with his mother I'm not sure if she is still living or if he is in regular contact with her. Right now it seems like he is living in a duplex in town. Patient claims that he owns 2 properties. This is not true but is a pretty typical delusion for him.  Medical history: Patient is in fairly good health for someone who smokes as much as he does. Doesn't really have any other significant medical problems.  Substance abuse history: Fortunately he does not drink or abuse any drugs and has no past significant substance use issues other than tobacco.  Past Psychiatric History: Patient has schizophrenia and has had a long history of multiple hospitalizations. When he is particularly psychotic he can get pretty agitated. A lot of his delusions often revolve around believes that he is some kind of gang liter or very rich. None of that is true. He has apparently recently been back to [facility]. He says he got out of there in March and now is living in this  duplex in town. He does not have a history of suicide attempts. He can get verbally worked up by don't think he is been terribly violent in the past although he probably has gotten into some altercations.  Risk to Self: Is patient at risk for suicide?: Yes Risk to Others:   Prior Inpatient Therapy:   Prior Outpatient Therapy:    Past Medical History:  Past Medical History:  Diagnosis Date  . Anxiety   .  Hypercholesteremia   . Hypertension   . Schizophrenia [facility])     Past Surgical History:  Procedure Laterality Date  . CIRCUMCISION, NON-NEWBORN     Family History: No family history on file. Family Psychiatric  History: Unknown. Not clear if he has any family history Social History:  History  Alcohol Use  . Yes    Comment: social drinker     History  Drug Use No    Social History   Social History  . Marital status: Legally Separated    Spouse name: N/A  . Number of children: N/A  . Years of education: N/A   Social History Main Topics  . Smoking status: Current Every Day Smoker    Packs/day: 2.00    Types: Cigarettes  . Smokeless tobacco: Never Used  . Alcohol use Yes     Comment: social drinker  . Drug use: No  . Sexual activity: Not Asked   Other Topics Concern  . None   Social History Narrative  . None   Additional Social History:    Allergies:   Allergies  Allergen Reactions  . Haldol [Haloperidol] Other (See Comments)    "lock up"  . Loxapine     "Locks ups"  . Penicillins Other (See Comments)    Neck swells Has patient had a PCN reaction causing immediate rash, facial/tongue/throat swelling, SOB or lightheadedness with hypotension: yes Has patient had a PCN reaction causing severe rash involving mucus membranes or skin necrosis: no Has patient had a PCN reaction that required hospitalization:  no Has patient had a PCN reaction occurring within the last 10 years: no If all of the above answers are "NO", then may proceed with Cephalosporin use.     Labs:  Results for orders placed or performed during the hospital encounter of 09/16/16 (from the past 48 hour(s))  Comprehensive metabolic panel     Status: Abnormal   Collection Time: 09/16/16  2:45 PM  Result Value Ref Range   Sodium 134 (L) 135 - 145 mmol/L   Potassium 3.8 3.5 - 5.1 mmol/L   Chloride 99 (L) 101 - 111 mmol/L   CO2 30 22 - 32 mmol/L   Glucose, Bld 115 (H) 65 - 99 mg/dL   BUN 7 6  - 20 mg/dL   Creatinine, Ser 0.78 0.61 - 1.24 mg/dL   Calcium 9.1 8.9 - 10.3 mg/dL   Total Protein 7.3 6.5 - 8.1 g/dL   Albumin 4.3 3.5 - 5.0 g/dL   AST 28 15 - 41 U/L   ALT 32 17 - 63 U/L   Alkaline Phosphatase 108 38 - 126 U/L   Total Bilirubin 0.7 0.3 - 1.2 mg/dL   GFR calc non Af Amer >60 >60 mL/min   GFR calc Af Amer >60 >60 mL/min    Comment: (NOTE) The eGFR has been calculated using the CKD EPI equation. This calculation has not been validated in all clinical situations. eGFR's persistently <60 mL/min signify possible Chronic Kidney Disease.    Anion gap  5 5 - 15  Ethanol     Status: None   Collection Time: 09/16/16  2:45 PM  Result Value Ref Range   Alcohol, Ethyl (B) <5 <5 mg/dL    Comment:        LOWEST DETECTABLE LIMIT FOR SERUM ALCOHOL IS 5 mg/dL FOR MEDICAL PURPOSES ONLY   Salicylate level     Status: None   Collection Time: 09/16/16  2:45 PM  Result Value Ref Range   Salicylate Lvl <1.6 2.8 - 30.0 mg/dL  Acetaminophen level     Status: Abnormal   Collection Time: 09/16/16  2:45 PM  Result Value Ref Range   Acetaminophen (Tylenol), Serum <10 (L) 10 - 30 ug/mL    Comment:        THERAPEUTIC CONCENTRATIONS VARY SIGNIFICANTLY. A RANGE OF 10-30 ug/mL MAY BE AN EFFECTIVE CONCENTRATION FOR MANY PATIENTS. HOWEVER, SOME ARE BEST TREATED AT CONCENTRATIONS OUTSIDE THIS RANGE. ACETAMINOPHEN CONCENTRATIONS >150 ug/mL AT 4 HOURS AFTER INGESTION AND >50 ug/mL AT 12 HOURS AFTER INGESTION ARE OFTEN ASSOCIATED WITH TOXIC REACTIONS.   cbc     Status: Abnormal   Collection Time: 09/16/16  2:45 PM  Result Value Ref Range   WBC 6.6 3.8 - 10.6 K/uL   RBC 5.33 4.40 - 5.90 MIL/uL   Hemoglobin 16.1 13.0 - 18.0 g/dL   HCT 47.4 40.0 - 52.0 %   MCV 88.8 80.0 - 100.0 fL   MCH 30.3 26.0 - 34.0 pg   MCHC 34.1 32.0 - 36.0 g/dL   RDW 14.7 (H) 11.5 - 14.5 %   Platelets 313 150 - 440 K/uL  Urine Drug Screen, Qualitative     Status: None   Collection Time: 09/16/16  2:45 PM   Result Value Ref Range   Tricyclic, Ur Screen NONE DETECTED NONE DETECTED   Amphetamines, Ur Screen NONE DETECTED NONE DETECTED   MDMA (Ecstasy)Ur Screen NONE DETECTED NONE DETECTED   Cocaine Metabolite,Ur [place] NONE DETECTED NONE DETECTED   Opiate, Ur Screen NONE DETECTED NONE DETECTED   Phencyclidine (PCP) Ur S NONE DETECTED NONE DETECTED   Cannabinoid 50 Ng, Ur [place] NONE DETECTED NONE DETECTED   Barbiturates, Ur Screen NONE DETECTED NONE DETECTED   Benzodiazepine, Ur Scrn NONE DETECTED NONE DETECTED   Methadone Scn, Ur NONE DETECTED NONE DETECTED    Comment: (NOTE) 109  Tricyclics, urine               Cutoff 1000 ng/mL 200  Amphetamines, urine             Cutoff 1000 ng/mL 300  MDMA (Ecstasy), urine           Cutoff 500 ng/mL 400  Cocaine Metabolite, urine       Cutoff 300 ng/mL 500  Opiate, urine                   Cutoff 300 ng/mL 600  Phencyclidine (PCP), urine      Cutoff 25 ng/mL 700  Cannabinoid, urine              Cutoff 50 ng/mL 800  Barbiturates, urine             Cutoff 200 ng/mL 900  Benzodiazepine, urine           Cutoff 200 ng/mL 1000 Methadone, urine                Cutoff 300 ng/mL 1100 1200 The urine drug screen provides only a preliminary, unconfirmed 1300  analytical test result and should not be used for non-medical 1400 purposes. Clinical consideration and professional judgment should 1500 be applied to any positive drug screen result due to possible 1600 interfering substances. A more specific alternate chemical method 1700 must be used in order to obtain a confirmed analytical result.  1800 Gas chromato graphy / mass spectrometry (GC/MS) is the preferred 1900 confirmatory method.   CK     Status: None   Collection Time: 09/16/16  2:45 PM  Result Value Ref Range   Total CK 130 49 - 397 U/L    No current facility-administered medications for this encounter.    Current Outpatient Prescriptions  Medication Sig Dispense Refill  . OLANZapine (ZYPREXA) 10 MG  tablet Take 30 mg by mouth at bedtime.    Marland Kitchen perphenazine (TRILAFON) 8 MG tablet Take 1 tablet (8 mg total) by mouth 3 (three) times daily. 90 tablet 3  . perphenazine (TRILAFON) 8 MG tablet Take 1.5 tablets (12 mg total) by mouth 3 (three) times daily. 135 tablet 1    Musculoskeletal: Strength & Muscle Tone: within normal limits Gait & Station: normal Patient leans: N/A  Psychiatric Specialty Exam: Physical Exam  Nursing note and vitals reviewed. Constitutional: He appears well-developed and well-nourished.  HENT:  Head: Normocephalic and atraumatic.  Eyes: Conjunctivae are normal. Pupils are equal, round, and reactive to light.  Neck: Normal range of motion.  Cardiovascular: Regular rhythm and normal heart sounds.   Respiratory: Effort normal. No respiratory distress.  GI: Soft.  Musculoskeletal: Normal range of motion.  Neurological: He is alert.  Skin: Skin is warm and dry.  Psychiatric: He has a normal mood and affect. His speech is normal and behavior is normal. He is not agitated. Thought content is paranoid. Cognition and memory are normal. He expresses impulsivity.    Review of Systems  Constitutional: Negative.   HENT: Negative.   Eyes: Negative.   Respiratory: Negative.   Cardiovascular: Negative.   Gastrointestinal: Negative.   Musculoskeletal: Negative.   Skin: Negative.   Neurological: Negative.   Psychiatric/Behavioral: Negative.     Blood pressure (!) 160/98, pulse 86, temperature 98.7 F (37.1 C), temperature source Oral, resp. rate 16, height _0  (1.803 m), weight 108 kg (238 lb), SpO2 97 %.Body mass index is 33.19 kg/m.  General Appearance: Fairly Groomed  Eye Contact:  Good  Speech:  Clear and Coherent  Volume:  Normal  Mood:  Euthymic  Affect:  Appropriate  Thought Process:  Goal Directed  Orientation:  Full (Time, Place, and Person)  Thought Content:  Delusions, Paranoid Ideation, Rumination and Tangential  Suicidal Thoughts:  No  Homicidal  Thoughts:  No  Memory:  Immediate;   Good Recent;   Fair Remote;   Fair  Judgement:  Fair  Insight:  Shallow  Psychomotor Activity:  Normal  Concentration:  Concentration: Fair  Recall:  [employer] of Knowledge:  Fair  Language:  Fair  Akathisia:  No  Handed:  Right  AIMS (if indicated):     Assets:  Communication Skills Desire for Improvement Financial Resources/Insurance Housing Physical Health Resilience Social Support  ADL's:  Intact  Cognition:  WNL  Sleep:        Treatment Plan Summary: Plan This is a 51 year old man with schizophrenia. I have worked with him several times over the years. When he is truly psychotic and potentially dangerous he is very chaotic and rageful. Right now he actually seems to be in pretty good shape despite  still having some delusions. There is no evidence really that he meets commitment criteria. No evidence of any dangerous intention or plan and no evidence of any suicidality. I talked with him about the option of hospitalization just as a way of temporizing his situation. He was able to acknowledge this but states that he would prefer to go home. Agrees to continue his antipsychotic medications. Totally denies any thoughts of hurting anyone on his act team. I have discontinued his commitment and counseled him briefly. Case reviewed with TTS and emergency room physician. Patient can be released from the emergency room to outpatient care in the community.  Disposition: Patient does not meet criteria for psychiatric inpatient admission. Supportive therapy provided about ongoing stressors.  Alethia Berthold, MD 09/16/2016 4:32 PM

## 2016-09-16 NOTE — ED Provider Notes (Signed)
[facility] Emergency Department Provider Note  ____________________________________________   First MD Initiated Contact with Patient 09/16/16 1500     (approximate)  I have reviewed the triage vital signs and the nursing notes.   HISTORY  Chief Complaint Aggressive Behavior  Level V exemption history Limited by the patient's psychiatric illness  HPI Kenneth [name] is a 51 y.o. male who comes to the emergency department on an involuntary commitment from [place] police. Apparently police were notified that the patient has been increasingly paranoid and aggressive and threatening to "go gangster on them". Unclear what the patient means. History is challenging to obtain as he is clearly paranoid and is concerned that people are out to get him and take his livelihood from him.   Past Medical History:  Diagnosis Date  . Anxiety   . Hypercholesteremia   . Hypertension   . Schizophrenia [facility])     Patient Active Problem List   Diagnosis Date Noted  . Tobacco use disorder 02/05/2016  . Schizophrenia, paranoid ([facility]) 01/26/2016  . Antisocial personality disorder 01/23/2016  . Noncompliance 01/23/2016    Past Surgical History:  Procedure Laterality Date  . CIRCUMCISION, NON-NEWBORN      Prior to Admission medications   Medication Sig Start Date End Date Taking? Authorizing Provider  OLANZapine (ZYPREXA) 10 MG tablet Take 30 mg by mouth at bedtime.    [provider]  perphenazine (TRILAFON) 8 MG tablet Take 1 tablet (8 mg total) by mouth 3 (three) times daily. 02/05/16   [name], Braulio Conte B, MD  perphenazine (TRILAFON) 8 MG tablet Take 1.5 tablets (12 mg total) by mouth 3 (three) times daily. 02/20/16   Clapacs, Jackquline Denmark, MD    Allergies Haldol [haloperidol]; Loxapine; and Penicillins  No family history on file.  Social History Social History  Substance Use Topics  . Smoking status: Current Every Day Smoker    Packs/day: 2.00    Types: Cigarettes  . Smokeless tobacco: Never Used  . Alcohol use Yes     Comment: social drinker    Review of Systems Level V exemption history Limited by the patient's clinical condition 10-point ROS otherwise negative.  ____________________________________________   PHYSICAL EXAM:  VITAL SIGNS: ED Triage Vitals  Enc Vitals Group     BP 09/16/16 1446 (!) 160/98     Pulse Rate 09/16/16 1446 86     Resp 09/16/16 1446 16     Temp 09/16/16 1446 98.7 F (37.1 C)     Temp Source 09/16/16 1446 Oral     SpO2 09/16/16 1446 97 %     Weight 09/16/16 1443 238 lb (108 kg)     Height 09/16/16 1443 5\' 11"  (1.803 m)     Head Circumference --      Peak Flow --      Pain Score --      Pain Loc --      Pain Edu? --      Excl. in GC? --     Constitutional: No acute distress paranoid pressured speech Eyes: PERRL EOMI. Head: Atraumatic. Nose: No congestion/rhinnorhea. Mouth/Throat: No trismus Neck: No stridor.   Cardiovascular: Normal rate, regular rhythm. Grossly normal heart sounds.  Good peripheral circulation. Respiratory: Normal respiratory effort.  No retractions. Lungs CTAB and moving good air Gastrointestinal: Soft nontender Musculoskeletal: No lower extremity edema   Neurologic:  Normal speech and language. No gross focal neurologic deficits are appreciated. Skin:  Skin is warm, dry and intact. No rash  noted. Psychiatric: Paranoid and somewhat aggressive.    ____________________________________________   ____________________________________________   LABS (all labs ordered are listed, but only abnormal results are displayed)  Labs Reviewed  COMPREHENSIVE METABOLIC PANEL - Abnormal; Notable for the following:       Result Value   Sodium 134 (*)    Chloride 99 (*)    Glucose, Bld 115 (*)    All other components within normal limits  ACETAMINOPHEN LEVEL - Abnormal; Notable for the following:    Acetaminophen (Tylenol), Serum <10 (*)    All other components within  normal limits  CBC - Abnormal; Notable for the following:    RDW 14.7 (*)    All other components within normal limits  ETHANOL  SALICYLATE LEVEL  URINE DRUG SCREEN, QUALITATIVE ([facility] ONLY)  CK     __________________________________________  EKG   ____________________________________________  RADIOLOGY   ____________________________________________   PROCEDURES  Procedure(s) performed: o  Procedures  Critical Care performed: no  Observation: no ____________________________________________   INITIAL IMPRESSION / ASSESSMENT AND PLAN / ED COURSE  Pertinent labs & imaging results that were available during my care of the patient were reviewed by me and considered in my medical decision making (see chart for details).  The patient is clearly disorganized paranoid and aggressive. I will continue his involuntary commitment and consult psychiatry for further recommendations. He denies ingestion and I see no objective signs of trauma on his body.     I discussed the case with Dr. Toni Amendlapacs from psychiatry who is quite familiar with the patient. He feels the patient is actually improved from his baseline and he is comfortable having him discharged home with outpatient management. At this point I see no reason to continue his involuntary commitment and he is discharged home in improved condition. ____________________________________________   FINAL CLINICAL IMPRESSION(S) / ED DIAGNOSES  Final diagnoses:  Paranoia (HCC)  Psychosis, unspecified psychosis type      NEW MEDICATIONS STARTED DURING THIS VISIT:  Discharge Medication List as of 09/16/2016  4:16 PM       Note:  This document was prepared using Dragon voice recognition software and may include unintentional dictation errors.     Merrily Brittleifenbark, Tanaysha Alkins, MD 09/19/16 2212

## 2016-09-16 NOTE — ED Notes (Signed)
Psych consult completed    BEHAVIORAL HEALTH ROUNDING Patient sleeping: No. Patient alert and oriented: yes Behavior appropriate: Yes.  ; If no, describe:  Nutrition and fluids offered: yes Toileting and hygiene offered: Yes  Sitter present: q15 minute observations and security monitoring Law enforcement present: Yes  ODS

## 2016-09-16 NOTE — ED Triage Notes (Signed)
Patient states "they do not give me my money" and "they are trying to get me property".  States the They is [employer].   Patient is agitated but cooperative.  Denies being delusional.  Arrives under IVC papers with [employer]. Denies SI/ HI.

## 2016-09-16 NOTE — ED Notes (Signed)
The patient was dressed out into the required purple scrubs. His belongs were placed into a white patient belongings bag and labeled properly. One bag total. He was then walked to hallway bed 20 with [employer] officer by his side. No incidents during this process.

## 2016-10-10 ENCOUNTER — Emergency Department
Admission: EM | Admit: 2016-10-10 | Discharge: 2016-10-12 | Disposition: A | Discharge status: Other Acute Inpatient Hospital | Payer: No Typology Code available for payment source | Attending: Emergency Medicine | Admitting: Emergency Medicine

## 2016-10-10 ENCOUNTER — Encounter: Payer: Self-pay | Admitting: Emergency Medicine

## 2016-10-10 DIAGNOSIS — F17210 Nicotine dependence, cigarettes, uncomplicated: Secondary | ICD-10-CM | POA: Insufficient documentation

## 2016-10-10 DIAGNOSIS — F209 Schizophrenia, unspecified: Secondary | ICD-10-CM | POA: Insufficient documentation

## 2016-10-10 DIAGNOSIS — F22 Delusional disorders: Secondary | ICD-10-CM | POA: Insufficient documentation

## 2016-10-10 DIAGNOSIS — Z79899 Other long term (current) drug therapy: Secondary | ICD-10-CM | POA: Insufficient documentation

## 2016-10-10 LAB — COMPREHENSIVE METABOLIC PANEL
ALK PHOS: 112 U/L (ref 38–126)
ALT: 21 U/L (ref 17–63)
AST: 27 U/L (ref 15–41)
Albumin: 4.5 g/dL (ref 3.5–5.0)
Anion gap: 10 (ref 5–15)
CO2: 26 mmol/L (ref 22–32)
CREATININE: 0.88 mg/dL (ref 0.61–1.24)
Calcium: 9.4 mg/dL (ref 8.9–10.3)
Chloride: 100 mmol/L — ABNORMAL LOW (ref 101–111)
GFR calc Af Amer: >60 mL/min (ref >60)
GLUCOSE: 102 mg/dL — AB (ref 65–99)
POTASSIUM: 3.6 mmol/L (ref 3.5–5.1)
Sodium: 136 mmol/L (ref 135–145)
TOTAL PROTEIN: 7.4 g/dL (ref 6.5–8.1)
Total Bilirubin: 0.5 mg/dL (ref 0.3–1.2)

## 2016-10-10 LAB — URINE DRUG SCREEN, QUALITATIVE (ARMC ONLY)
AMPHETAMINES, UR SCREEN: NOT DETECTED
BENZODIAZEPINE, UR SCRN: NOT DETECTED
Barbiturates, Ur Screen: NOT DETECTED
CANNABINOID 50 NG, UR ~~LOC~~: NOT DETECTED
Cocaine Metabolite,Ur ~~LOC~~: NOT DETECTED
MDMA (ECSTASY) UR SCREEN: NOT DETECTED
Methadone Scn, Ur: NOT DETECTED
OPIATE, UR SCREEN: NOT DETECTED
PHENCYCLIDINE (PCP) UR S: NOT DETECTED
Tricyclic, Ur Screen: NOT DETECTED

## 2016-10-10 LAB — CBC
HEMATOCRIT: 44.1 % (ref 40.0–52.0)
Hemoglobin: 15.1 g/dL (ref 13.0–18.0)
MCH: 30.5 pg (ref 26.0–34.0)
MCHC: 34.2 g/dL (ref 32.0–36.0)
MCV: 89.2 fL (ref 80.0–100.0)
Platelets: 374 10*3/uL (ref 150–440)
RBC: 4.94 MIL/uL (ref 4.40–5.90)
RDW: 15.3 % — ABNORMAL HIGH (ref 11.5–14.5)
WBC: 10.4 10*3/uL (ref 3.8–10.6)

## 2016-10-10 LAB — SALICYLATE LEVEL: Salicylate Lvl: <7 mg/dL (ref 2.8–30.0)

## 2016-10-10 LAB — ACETAMINOPHEN LEVEL: Acetaminophen (Tylenol), Serum: <10 ug/mL — ABNORMAL LOW (ref 10–30)

## 2016-10-10 LAB — ETHANOL: Alcohol, Ethyl (B): <5 mg/dL (ref <5)

## 2016-10-10 MED ORDER — PERPHENAZINE 4 MG PO TABS
12.0000 mg | ORAL_TABLET | Freq: Two times a day (BID) | ORAL | Status: DC
  Administered 2016-10-10 – 2016-10-12 (×4): 12 mg via ORAL
  Filled 2016-10-10 (×6): qty 3

## 2016-10-10 MED ORDER — RISPERIDONE 1 MG PO TABS
2.0000 mg | ORAL_TABLET | Freq: Every day | ORAL | Status: DC
  Filled 2016-10-10 (×2): qty 2

## 2016-10-10 NOTE — ED Notes (Signed)
PT IVC/ PENDING PLACEMENT  

## 2016-10-10 NOTE — ED Notes (Signed)
PT  IVC  SOC  CALLED 

## 2016-10-10 NOTE — ED Notes (Signed)
Clothing consist of shoes, jeans, t-shirt, socks, belt,
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
## 2016-10-10 NOTE — ED Notes (Signed)
2210 - 2230 pt refusing meds, requesting trilafon for anxiety and denying schizophrenia, then reporting hx of paranoid schizophrenia, pt reports "I'm am Barbarius"  Dr Cyril LoosenKinner notified, meds ordered,   Pharmacy called

## 2016-10-10 NOTE — ED Notes (Signed)
When asked what brought patient to ED today, patient states "Nothing. Mind your own business". Patient educated that we are here to help him. Patient states, "No. You all are trying to control me. Leave me alone".

## 2016-10-11 NOTE — ED Notes (Signed)
Pt received from previous shift.  Pt frequently out of room with requests.  Tells this nurse upon meeting that he is Dr Sharlet SalinaBenjamin.  Irritable with security.
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
## 2016-10-11 NOTE — ED Notes (Signed)
Provided with snack and fluids.

## 2016-10-11 NOTE — ED Notes (Signed)
[facility] Radiographer, therapeutictech and officer.

## 2016-10-11 NOTE — ED Notes (Signed)
Pt apologizing for refusing meds and "If I was rude, I didn't mean to be, I just know what medications I need."  Positive rapport established. Pt took meds without problem.  Pt still responding to internal stimuli, reporting that the pt organized the bands AC/DC and Pink Adela LankFloyd

## 2016-10-11 NOTE — ED Notes (Signed)
Took Trilafon refused Risperdal.  States "I have already told you I do not take that med.  Don't ever bring it to me again."

## 2016-10-11 NOTE — ED Notes (Signed)
Pt ivc/ pending placement  

## 2016-10-11 NOTE — ED Notes (Signed)
BEHAVIORAL HEALTH ROUNDING Patient sleeping: No. Patient alert and oriented: not applicable Behavior appropriate: Yes.  ; If no, describe:  Nutrition and fluids offered: Yes  Toileting and hygiene offered: Yes  Sitter present: not applicable Law enforcement present: Yes  

## 2016-10-11 NOTE — ED Notes (Signed)
PT IVC/ PENDING PLACEMENT  
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
## 2016-10-11 NOTE — ED Notes (Signed)
[facility] Await their readiness to take patient.

## 2016-10-11 NOTE — ED Notes (Signed)

## 2016-10-11 NOTE — ED Notes (Signed)
Has taken breakfast, requesting something to drink and TV remote in appropriate manner. Alternately walking in room and resting on bed. Noted to be speaking to wall at times.

## 2016-10-11 NOTE — BH Assessment (Signed)
Referral information for Psychiatric Hospitalization faxed to;    [facility] [phone])   [facility] [phone]    [facility] [phone]),    [facility] [phone])   [facility] [phone]),    [facility] [phone]),    [facility] [phone]),    [facility] [phone]).   [facility]

## 2016-10-12 ENCOUNTER — Inpatient Hospital Stay
Admission: AD | Admit: 2016-10-12 | Discharge: 2016-12-03 | DRG: 885 | Disposition: A | Discharge status: Home / Self Care | Payer: No Typology Code available for payment source | Attending: Psychiatry | Admitting: Psychiatry

## 2016-10-12 DIAGNOSIS — Z9119 Patient's noncompliance with other medical treatment and regimen: Secondary | ICD-10-CM | POA: Diagnosis not present

## 2016-10-12 DIAGNOSIS — Z9114 Patient's other noncompliance with medication regimen: Secondary | ICD-10-CM

## 2016-10-12 DIAGNOSIS — F17210 Nicotine dependence, cigarettes, uncomplicated: Secondary | ICD-10-CM | POA: Diagnosis present

## 2016-10-12 DIAGNOSIS — I10 Essential (primary) hypertension: Secondary | ICD-10-CM | POA: Diagnosis present

## 2016-10-12 DIAGNOSIS — G4700 Insomnia, unspecified: Secondary | ICD-10-CM | POA: Diagnosis not present

## 2016-10-12 DIAGNOSIS — Z91199 Patient's noncompliance with other medical treatment and regimen due to unspecified reason: Secondary | ICD-10-CM

## 2016-10-12 DIAGNOSIS — F203 Undifferentiated schizophrenia: Principal | ICD-10-CM | POA: Diagnosis present

## 2016-10-12 DIAGNOSIS — Z5320 Procedure and treatment not carried out because of patient's decision for unspecified reasons: Secondary | ICD-10-CM | POA: Diagnosis not present

## 2016-10-12 DIAGNOSIS — F23 Brief psychotic disorder: Secondary | ICD-10-CM | POA: Diagnosis present

## 2016-10-12 DIAGNOSIS — F17200 Nicotine dependence, unspecified, uncomplicated: Secondary | ICD-10-CM | POA: Diagnosis not present

## 2016-10-12 DIAGNOSIS — R443 Hallucinations, unspecified: Secondary | ICD-10-CM | POA: Diagnosis not present

## 2016-10-12 MED ORDER — HYDROXYZINE HCL 50 MG PO TABS: 50.0000 mg | ORAL_TABLET | Freq: Three times a day (TID) | ORAL | Status: DC | PRN

## 2016-10-12 MED ORDER — ALUM & MAG HYDROXIDE-SIMETH 200-200-20 MG/5ML PO SUSP: 30.0000 mL | ORAL | Status: DC | PRN

## 2016-10-12 MED ORDER — OLANZAPINE 10 MG PO TABS
30.0000 mg | ORAL_TABLET | Freq: Every day | ORAL | Status: DC
  Filled 2016-10-12 (×2): qty 3

## 2016-10-12 MED ORDER — PERPHENAZINE 8 MG PO TABS
12.0000 mg | ORAL_TABLET | Freq: Three times a day (TID) | ORAL | Status: DC
  Administered 2016-10-12 – 2016-10-13 (×2): 12 mg via ORAL
  Filled 2016-10-12 (×4): qty 1

## 2016-10-12 MED ORDER — MAGNESIUM HYDROXIDE 400 MG/5ML PO SUSP: 30.0000 mL | Freq: Every day | ORAL | Status: DC | PRN

## 2016-10-12 MED ORDER — TRAZODONE HCL 50 MG PO TABS: 50.0000 mg | ORAL_TABLET | Freq: Every evening | ORAL | Status: DC | PRN

## 2016-10-12 MED ORDER — ACETAMINOPHEN 325 MG PO TABS: 650.0000 mg | ORAL_TABLET | Freq: Four times a day (QID) | ORAL | Status: DC | PRN

## 2016-10-12 NOTE — ED Provider Notes (Signed)
-----------------------------------------   7:26 AM on 10/12/2016 -----------------------------------------   Blood pressure 139/84, pulse 66, temperature 98.4 F (36.9 C), temperature source Oral, resp. rate 16, height 6' (1.829 m), weight 108.9 kg (240 lb), SpO2 97 %.  The patient had no acute events since last update.  Calm and cooperative at this time.  Disposition is pending Psychiatry/Behavioral Medicine team recommendations.     Arnaldo NatalMalinda, Maci Eickholt F, MD 10/12/16 [phone]
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
## 2016-10-12 NOTE — ED Notes (Signed)
Pt medication Trilafon was not available. Spoke with Barbara CowerJason in pharmacy and it will be tube up.  Annia Friendlysie, RN-BSN

## 2016-10-12 NOTE — ED Notes (Signed)
Pt frequently at the desk asking for food and etc.  Given several snack and fluids.  Tells staff he is a doctor and "Kenneth [name]".  Pt was not able to be reoriented and gets upset with reality orientation.  Pt labile, irritable often self dialoging.  Poor hygiene.  Displays a flat affect, Appears apathetic.  Denies s/i or h/i.  Does not feel he is delusional or hallucinating but he displays both to staff. Monitored on 15 minute safety checks.

## 2016-10-12 NOTE — ED Notes (Signed)
Pt ambulated to the bathroom to void and returned to his room without difficulty.  
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
## 2016-10-12 NOTE — ED Notes (Signed)
Pt refused medication from Clinical research associatewriter, another nurse will attempt to give pt medication. Will cont to monitor pt.

## 2016-10-12 NOTE — ED Notes (Signed)
Report called and given to Toniann FailWendy, RN in BMU. Vitals are stable. Pt is IVC'd. (parphenazine, Trilafon) medication will be transferred with patient to BMU unit.

## 2016-10-12 NOTE — ED Notes (Signed)
Kenneth CooterHenry RN gave the medication to the Pt. Pt was paranoid and it took explaining and  convincing to get him to take the medication.

## 2016-10-12 NOTE — Progress Notes (Signed)
Patient arrived on to the unit at 4:30pm. As soon as staff placed his belongings bag in the nursing station, he began demanding to hold his belongings. He was assured that he would be given his belongings as soon as [place] inventoried and searched it. He went on to say, "if you touch that book, you will die." "Don't touch my book." He was fixated on the book even during skin checks and assessment. BP was elevated(he was agitated), he refused a manual recheck but will keep prompting/trying to obtain a manual one.  Patient also kept saying, "am dead" to most assessment questions. Disorganized, irritable, delusional and paranoid. He entered his emergency contact number as [phone]. Patient required lots of encouragement to take his 5pm medication (Perphenazine). He asked to first see the medication and the milligrams on it. Patient was oriented to the unit, given his belongings and he was visible in the dayroom. Will continue to monitor.

## 2016-10-12 NOTE — ED Notes (Signed)
Pt ambulate to bathroom to void without difficulty and return back to room.
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
## 2016-10-12 NOTE — ED Notes (Signed)
Pt ambulate to toilet and voided. Pt ambulate to room without difficulty.

## 2016-10-12 NOTE — BH Assessment (Signed)
[facility]. [facility] BMU rounding Physician will put in admission orders. Attending Physician will be Dr. Jennet MaduroPucilowska.   Patient [facility] room 314[facility] ElkaderMandy.   Intake Paper Work has been signed and placed on patient chart.  ER staff is aware of the admission Salley Scarlet(Nitchia, ER Sect.; Dr. Lenard LancePaduchowski, ER MD; Amy, Patient's Nurse & Marylene LandAngela, Patient Access).

## 2016-10-12 NOTE — ED Notes (Signed)
Pt appeared to sleep while monitored on 15 minute checks.

## 2016-10-12 NOTE — ED Notes (Signed)
ED BHU PLACEMENT JUSTIFICATION Is the patient under IVC or is there intent for IVC: Yes.   Is the patient medically cleared: Yes.   Is there vacancy in the ED BHU: Yes.   Is the population mix appropriate for patient: Yes.   Is the patient awaiting placement in inpatient or outpatient setting: Yes.   Has the patient had a psychiatric consult: Yes.   Survey of unit performed for contraband, proper placement and condition of furniture, tampering with fixtures in bathroom, shower, and each patient room: Yes.  ; Findings: environment is safe APPEARANCE/BEHAVIOR calm and cooperative NEURO ASSESSMENT Orientation: time, place and person Hallucinations: No.None noted (Hallucinations) Speech: Normal Gait: normal RESPIRATORY ASSESSMENT Normal expansion.  Clear to auscultation.  No rales, rhonchi, or wheezing. CARDIOVASCULAR ASSESSMENT regular rate and rhythm, S1, S2 normal, no murmur, click, rub or gallop GASTROINTESTINAL ASSESSMENT soft, nontender, BS WNL, no r/g EXTREMITIES normal strength, tone, and muscle mass PLAN OF CARE Provide calm/safe environment. Vital signs assessed twice daily. ED BHU Assessment once each 12-hour shift. Collaborate with intake RN daily or as condition indicates. Assure the ED provider has rounded once each shift. Provide and encourage hygiene. Provide redirection as needed. Assess for escalating behavior; address immediately and inform ED provider.  Assess family dynamic and appropriateness for visitation as needed: No.; If necessary, describe findings: Family not present unable to assess family dynamic. Educate the patient/family about BHU procedures/visitation: Yes.  ; If necessary, describe findings: Patient given rules of visitation of unit.
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
## 2016-10-12 NOTE — ED Notes (Signed)
Pt requested to take a shower. Amy RN gave toiletries and change of paper scrubs to the Pt. Pt went into the shower.

## 2016-10-13 MED ORDER — PERPHENAZINE 4 MG PO TABS
12.0000 mg | ORAL_TABLET | Freq: Three times a day (TID) | ORAL | Status: DC
  Administered 2016-10-13 – 2016-10-22 (×21): 12 mg via ORAL
  Filled 2016-10-13 (×25): qty 3

## 2016-10-13 NOTE — [facility] Group Notes (Signed)
[facility] Group Notes:  (Nursing/MHT/Case Management/Adjunct)  Date:  10/13/2016  Time:  11:23 PM  Type of Therapy:  Psychoeducational Skills  Participation Level:  Did Not Attend  Kenneth [name] R [name] 10/13/2016, 11:23 PM 
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
## 2016-10-13 NOTE — Progress Notes (Signed)
Recreation Therapy Notes  Date: 06.04.18 Time: 9:30 am Location: Craft Room  Group Topic: Self-expression  Goal Area(s) Addresses:  Patient will be able to identify a color that represents each emotion. Patient will verbalize benefit of using art as a means of self-expression. Patient will verbalize one emotion experienced while participating in activity.  Behavioral Response: Did not attend  Intervention: The Colors Within Me  Activity: Patients were given a blank face worksheet and were instructed to pick a color for each emotion they were feeling and show on the worksheet how much of that emotion they were feeling.  Education: LRT educated patients on other forms of self-expression.  Education Outcome: Patient did not attend group.   Clinical Observations/Feedback: Patient did not attend group.  Magaby Rumberger M, LRT/CTRS 10/13/2016 10:15 AM 

## 2016-10-13 NOTE — [facility] Group Notes (Signed)
[facility] LCSW Group Therapy   10/13/2016 1:00 pm Type of Therapy: Group Therapy   Participation Level: Patient invited but did not attend.   Hampton AbbotKadijah Landry Lookingbill, MSW, LCSW-A 10/13/2016, 2:43PM
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
## 2016-10-13 NOTE — [facility] Group Notes (Signed)
[facility] Group Notes:  (Nursing/MHT/Case Management/Adjunct)  Date:  10/13/2016  Time:  4:30 AM  Type of Therapy:  Group Therapy  Participation Level:  Active  Participation Quality:  Appropriate  Affect:  Appropriate  Cognitive:  Alert  Insight:  Appropriate  Engagement in Group:  Engaged  Modes of Intervention:  n/a  Summary of Progress/Problems:  Kenneth [name] 10/13/2016, 4:30 AM
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
## 2016-10-13 NOTE — Plan of Care (Signed)
Problem: Safety: Goal: Ability to remain free from injury will improve Outcome: Progressing Patient with steady gait, no injuries reported or observed.   

## 2016-10-14 NOTE — Progress Notes (Signed)
Recreation Therapy Notes  Date: 06.05.18 Time: 9:30 am Location: Craft Room  Group Topic: Coping Skills  Goal Area(s) Addresses:  Patient will write healthy coping skills. Patient will verbalize benefit of using healthy coping skills.  Behavioral Response: Did not attend  Intervention: Coping Skills Alphabet  Activity: Patients were given a Coping Skills Alphabet worksheet and were instructed to write healthy coping skills for each letter of the alphabet.  Education: LRT educated patients on healthy coping skills.  Education Outcome: Patient did not attend group.   Clinical Observations/Feedback: Patient did not attend group.  [name],Teila Skalsky M, LRT/CTRS 10/14/2016 10:15 AM

## 2016-10-14 NOTE — Plan of Care (Signed)
Problem: Coping: Goal: Ability to verbalize frustrations and anger appropriately will improve Outcome: Not Progressing Patient continues to exhibit frustrations when communicating with staff. Refused to have blood drawn by lab this morning stating, " I have the right to refuse getting my blood drawn, you are not going to make me either." Encouraged patient to cooperate with the physicians order, patient continued to refuse.
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
## 2016-10-15 MED ORDER — TEMAZEPAM 15 MG PO CAPS
30.0000 mg | ORAL_CAPSULE | Freq: Every day | ORAL | Status: DC
  Filled 2016-10-15 (×3): qty 2

## 2016-10-15 NOTE — Progress Notes (Signed)
D: Angry affect, Up for meals . Came out of from  ,room Yelling and cursing  At staff  want to be discharged   Today.  Periods of cursing and swinging  To " I like you " .  No ADL's or personal chores  No Interaction   With peers. Patient   Refused all medication this shift . Appetite good . Patient remains  hostile . Denies suicidal  ideations A: Encourage patient participation with unit programming . Instruction  Given on  Medication , verbalize understanding. R: Voice no other concerns. Staff continue to monitor

## 2016-10-15 NOTE — Plan of Care (Signed)
Problem: Coping: Goal: Ability to verbalize frustrations and anger appropriately will improve Outcome: Not Progressing Patient is guarded not interacting with peers and staff.

## 2016-10-15 NOTE — Progress Notes (Signed)
D: Pt denies SI/HI/AVH. Pt is irritable and angry towards staff, affect is blunted and mood is hostile. Pt is not interacting with peers and staff appropriately.  A: Pt was offered support and encouragement. Pt was given scheduled medications. Pt was encouraged to attend groups. Q 15 minute checks were done for safety.  R:Pt attends groups and interacts well with peers and staff. Pt is taking medication. Pt has no complaints.Pt receptive to treatment and safety maintained on unit.
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
## 2016-10-15 NOTE — Plan of Care (Signed)
Problem: Coping: Goal: Ability to verbalize frustrations and anger appropriately will improve  List of Coping  Skills given to patient

## 2016-10-15 NOTE — Progress Notes (Signed)
Recreation Therapy Notes  This afternoon, patient was walked by LRT stating he needed to be discharged or else he would find a way to leave. Patient walked by LRT again and stated he meant what he said and to just watch. Patient is not appropriate for assessment at this time.  [name],Kenneth [name], LRT/CTRS 10/15/2016 4:08 PM

## 2016-10-15 NOTE — Progress Notes (Signed)
Recreation Therapy Notes  Date: 06.06.18 Time: 9:30 am Location: Craft Room  Group Topic: Self-esteem  Goal Area(s) Addresses: Patient will write at least one positive trait about self. Patient will verbalize benefit of having a healthy self-esteem.   Behavioral Response: Did not attend  Intervention: I Am   Activity: Patients were given a worksheet with the letter I on it and were instructed to write as many positive traits about themselves inside the letter.  Education: LRT educated patients on ways to increase their self-esteem.   Education Outcome: Patient did not attend group.   Clinical Observations/Feedback: Patient did not attend group.  [name],Brodin Gelpi M, LRT/CTRS 10/15/2016 9:57 AM

## 2016-10-16 MED ORDER — OLANZAPINE 5 MG PO TBDP
20.0000 mg | ORAL_TABLET | Freq: Every day | ORAL | Status: DC
  Administered 2016-10-17 – 2016-10-19 (×3): 20 mg via ORAL
  Filled 2016-10-16 (×3): qty 4

## 2016-10-16 MED ORDER — OLANZAPINE 10 MG IM SOLR
10.0000 mg | Freq: Every day | INTRAMUSCULAR | Status: DC
  Administered 2016-10-16: 10 mg via INTRAMUSCULAR
  Filled 2016-10-16 (×5): qty 10

## 2016-10-16 MED ORDER — CLONAZEPAM 0.5 MG PO TABS
1.0000 mg | ORAL_TABLET | Freq: Three times a day (TID) | ORAL | Status: DC
  Administered 2016-10-17 – 2016-10-18 (×4): 1 mg via ORAL
  Filled 2016-10-16 (×4): qty 2

## 2016-10-16 NOTE — Progress Notes (Signed)
Pt very irritable seen talking to self in his room. Pt refused am po trilafon stated, " I don't want any meds now get out of here". Pt withdrawn to room. Pt remains delusional and refuses care physician notified will start forced med process if pt refuses po medications.

## 2016-10-16 NOTE — [facility] Group Notes (Signed)
[facility] LCSW Group Therapy  10/16/2016 2:15 PM  Type of Therapy:  Group Therapy  Participation Level:  Did Not Attend  Summary of Progress/Problems: Balance in life: Patients will discuss the concept of balance and how it looks and feels to be unbalanced. Pt will identify areas in their life that is unbalanced and ways to become more balanced. They discussed what aspects in their lives has influenced their self care. Patients also discussed self care in the areas of self regulation/control, hygiene/appearance, sleep/relaxation, healthy leisure, healthy eating habits, exercise, inner peace/spirituality, self improvement, sobriety, and health management. They were challenged to identify changes that are needed in order to improve self care.  Dannielle Baskins G. Garnette CzechSampson MSW, LCSWA 10/16/2016, 2:15 PM

## 2016-10-16 NOTE — Plan of Care (Signed)
Problem: Safety: Goal: Ability to remain free from injury will improve Outcome: Progressing Pt remains free from injury and safe.
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
## 2016-10-16 NOTE — [facility] Group Notes (Signed)
[facility] LCSW Group Therapy Note  Type of Therapy and Topic:  Group Therapy:  Goals Group: SMART Goals  Participation Level:  Patient did not attend group. CSW invited patient to group.   Description of Group:   The purpose of a daily goals group is to assist and guide patients in setting recovery/wellness-related goals.  The objective is to set goals as they relate to the crisis in which they were admitted. Patients will be using SMART goal modalities to set measurable goals.  Characteristics of realistic goals will be discussed and patients will be assisted in setting and processing how one will reach their goal. Facilitator will also assist patients in applying interventions and coping skills learned in psycho-education groups to the SMART goal and process how one will achieve defined goal.  Therapeutic Goals: -Patients will develop and document one goal related to or their crisis in which brought them into treatment. -Patients will be guided by LCSW using SMART goal setting modality in how to set a measurable, attainable, realistic and time sensitive goal.  -Patients will process barriers in reaching goal. -Patients will process interventions in how to overcome and successful in reaching goal.   Summary of Patient Progress:  Patient Goal: Patient did not attend group. CSW invited patient to group.   Therapeutic Modalities:   Motivational Interviewing Engineer, manufacturing systemsCognitive Behavioral Therapy Crisis Intervention Model SMART goals setting  Ivet Guerrieri G. Garnette CzechSampson MSW, [facility] LLCCSWA 10/16/2016 10:56 AM
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
## 2016-10-16 NOTE — [facility] Group Notes (Signed)
  [facility] LCSW Group Therapy Note  Date/Time: 10/15/16, 1300  Late entry note.  Type of Therapy/Topic:  Group Therapy:  Emotion Regulation  Participation Level:  Did Not Attend   Mood:  Description of Group:    The purpose of this group is to assist patients in learning to regulate negative emotions and experience positive emotions. Patients will be guided to discuss ways in which they have been vulnerable to their negative emotions. These vulnerabilities will be juxtaposed with experiences of positive emotions or situations, and patients challenged to use positive emotions to combat negative ones. Special emphasis will be placed on coping with negative emotions in conflict situations, and patients will process healthy conflict resolution skills.  Therapeutic Goals: 1. Patient will identify two positive emotions or experiences to reflect on in order to balance out negative emotions:  2. Patient will label two or more emotions that they find the most difficult to experience:  3. Patient will be able to demonstrate positive conflict resolution skills through discussion or role plays:   Summary of Patient Progress:       Therapeutic Modalities:   Cognitive Behavioral Therapy Feelings Identification Dialectical Behavioral Therapy  Daleen SquibbGreg Katniss Weedman, LCSW
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
## 2016-10-16 NOTE — Progress Notes (Signed)
Recreation Therapy Notes  Date: 06.07.18 Time: 9:30 am Location: Craft Room  Group Topic: Leisure Education  Goal Area(s) Addresses:  Patient will identify things they are grateful for. Patient will identify how being grateful can influence decision making.  Behavioral Response: Did not attend  Intervention: Grateful Wheel  Activity: Patients were given an I Am Grateful For worksheet and were instructed to write things they are grateful for under each category.   Education: LRT educated patients on leisure.  Education Outcome: Patient did not attend group.   Clinical Observations/Feedback: Patient did not attend group.  [name],Barron Vanloan M, LRT/CTRS 10/16/2016 10:06 AM

## 2016-10-16 NOTE — Progress Notes (Addendum)
[facility] for Medication Administration to Non-consenting Patients (For Involuntarily Committed Patients)  Patient: Kenneth MasseBenjamin M [name] Date of Birth: [id number] 14, 2067 MRN: [id number]  Reason for the Medication: The patient, without the benefit of the specific treatment measure, is incapable of participating in any available treatment plan that will give the patient a realistic opportunity of improving the patient's condition. There is, without the benefit of the specific treatment measure, a significant possibility that the patient will harm self or others before improvement of the patient's condition is realized.  Consideration of Side Effects: Consideration of the side effects related to the medication plan has been given. Medications being considered are ones that the patient has taken in the past and is known to tolerate well. Every consideration will be given to minimizing side effects and negative consequences of any medication.  Rationale for Medication Administration: This is a patient with known chronic psychotic disorder as well as a known past history of aggression related to psychosis. His illness is very unlikely to improve without medication and is in fact very likely to worsen putting himself and others around him in danger. Patient is currently extremely delusional and psychotic and incapable of making any kind of informed or reasonable decision about his treatment. He is actively resisting recommended medication.    Kenneth RasmussenJohn Clapacs, MD 10/16/16  12:24 PM   This documentation is good for (7) seven days from the date of the MD signature. New documentation must be completed every seven (7) days with detailed justification in the medical record if the patient requires continued non-emergent administration of psychotropic medications.

## 2016-10-16 NOTE — Progress Notes (Signed)
DAR  Pt was very confused, loud, delusional and rude; states, "This is my hospital; all the people you have killed are coming after you; I can see them; get out of my room; you. Pt would not answer any assessment question. Pt refused med and did not go for group.

## 2016-10-17 NOTE — Progress Notes (Signed)
D: "Do not bring any medication in here. Go away now."  Pt observed to be self dialoging frequently throughout the shift.  Upon approach yells at staff.  Refused HS Restoril with above statement. Would not allow staff to further assess him.  A: Pt encouraged to have proper behavior and take medications as prescribed.  Maintained on 15 minute safety checks.  R: Agitated with staff upon approach.  Labile, psychotic, delusional and hallucinating.
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
## 2016-10-17 NOTE — Progress Notes (Signed)
Pt appeared to sleep most of the night while monitored on 15 minute safety checks.  Over heard self dialoging this am.
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
## 2016-10-17 NOTE — [facility] Group Notes (Signed)
[facility] Group Notes:  (Nursing/MHT/Case Management/Adjunct)  Date:  10/17/2016  Time:  12:38 AM  Type of Therapy:  Group Therapy  Participation Level:  Did Not Attend  Participation Quality:Summary of Progress/Problems:  [name] 10/17/2016, 12:38 AM

## 2016-10-18 NOTE — [facility] Group Notes (Signed)
[facility] Group Notes:  (Nursing/MHT/Case Management/Adjunct)  Date:  10/18/2016  Time:  1:06 AM  Type of Therapy:  Evening Wrap-up Group  Participation Level:  Did Not Attend  Participation Quality:  N/A  Affect:  N/A  Cognitive:  N/A  Insight:  None  Engagement in Group:  Did Not Attend  Modes of Intervention:  Activity  Summary of Progress/Problems:  Kenneth MorrowChelsea [name] [name] 10/18/2016, 1:06 AM

## 2016-10-18 NOTE — Plan of Care (Signed)
Problem: Safety: Goal: Ability to remain free from injury will improve Outcome: Progressing Pt safe on the unit at this time   

## 2016-10-18 NOTE — Progress Notes (Signed)
Pt spent the majority of the evening in the room, pt isolated to the room. Pt has been sleep most of the evening
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
## 2016-10-18 NOTE — Progress Notes (Signed)
[facility] LCSW Group Therapy   10/18/2016  1 PM  Type of Therapy: Group Therapy   Participation Level: Did Not Attend. Patient invited to participate but declined.    Casia Corti F. Danyell Awbrey, MSW, LCSWA, LCAS     
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
## 2016-10-18 NOTE — Plan of Care (Signed)
Problem: Safety: Goal: Ability to demonstrate self-control will improve Outcome: Not Progressing Patient was out of the room this evening and spent time in the dayroom with peers.  He was observed chatting with male patients and behavior was appropriate.  He seemed to start conversations with females versus males.  He went to his room after HS snack and was overheard speaking loudly and angerly at unseen others.  He refused to take Restoril for sleep tonight.  He displays paranoid behavior but is less hostile toward staff compared to admission period.

## 2016-10-18 NOTE — Plan of Care (Signed)
Problem: Activity: Goal: Interest or engagement in activities will improve Outcome: Not Progressing Patient keeps to self and stays in room majority of the time having conversations with self and the demons. Patient will come out of room for meals.

## 2016-10-19 MED ORDER — OLANZAPINE 5 MG PO TBDP
30.0000 mg | ORAL_TABLET | Freq: Every day | ORAL | Status: DC
  Administered 2016-10-20: 15 mg via ORAL
  Administered 2016-10-21 – 2016-12-03 (×44): 30 mg via ORAL
  Filled 2016-10-19 (×46): qty 6

## 2016-10-19 NOTE — Plan of Care (Signed)
Problem: Coping: Goal: Ability to verbalize frustrations and anger appropriately will improve Outcome: Progressing Patient requested to shave this evening and was assisted with this.  He showered and changed clothes.  He spent some time in the dayroom with peers and engaged in conversation with a male patient.  He refused Restoril after agreeing to take it earlier.  He was overheard responding to unseen others when alone in his room.  Irritability is slightly diminished although he continues to speak to staff in a suspicious manner and with an aggressive tone.
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
## 2016-10-19 NOTE — [facility] Group Notes (Signed)
[facility] Group Notes:  (Nursing/MHT/Case Management/Adjunct)  Date:  10/19/2016  Time:  11:25 PM  Type of Therapy:  Psychoeducational Skills  Participation Level:  Did Not Attend  Foy GuadalajaraJasmine R Ameya Kutz 10/19/2016, 11:25 PM

## 2016-10-19 NOTE — Progress Notes (Signed)
Pt isolative to room for much of shift.  Reports he "likes keeping to myself."  Pt overheard several times talking to self in his room alone.  Unable to understand what he is saying.  Pt was compliant with medications without encouragement - reported to med room as instructed and accepted medications without incident or resistance.  No behavioral issues.

## 2016-10-19 NOTE — [facility] Group Notes (Signed)
[facility] Group Notes: (Clinical Social Work)   10/19/2016      Type of Therapy:  Group Therapy   Participation Level:  Did Not Attend, was invited   Ambrose MantleMareida Grossman-Orr, LCSW 10/19/2016, 2:46 PM

## 2016-10-19 NOTE — Plan of Care (Signed)
Problem: Health Behavior/Discharge Planning: Goal: Compliance with treatment plan for underlying cause of condition will improve Outcome: Progressing Pt complied with medication today with minimal encouragement

## 2016-10-20 NOTE — Plan of Care (Signed)
Problem: Coping: Goal: Ability to demonstrate self-control will improve Outcome: Not Progressing Patient was demonstrating temper tantrums unable to control his emotions.

## 2016-10-20 NOTE — Progress Notes (Signed)
Patient was little irritable with the medications this morning,took the rest of the meds without any issues.More visible in the milieu and interacting with peers especially females.Staff found patient going to a male patients room.When staff talked to patient about this patient states"OK  I will  not".Patient was hitting the wall and talking loud in hid room.Patient states "I am talking to my demons".Continues to have grandiose and delusional talks.Support & encouragement given.

## 2016-10-20 NOTE — Progress Notes (Signed)
D: Patient refused to answer whether he felt SI/HI/AVH, he was hostile towards staff sometimes using profanities. Pt is pleasant and cooperative. Pt appears anxious, agitated and irritable and he is not interacting with peers and staff appropriately.  A: Pt was offered support and encouragement. Pt was offered  scheduled medications. Pt was encouraged to attend groups. Q 15 minute checks were done for safety.  R:Pt did not attends group. Pt refused medication. Pt is not receptive to treatment, safety maintained on unit.

## 2016-10-20 NOTE — [facility] Group Notes (Signed)
[facility]  Date/Time: 10/20/2016, 1 pm  Type of Therapy and Topic:  Group Therapy:  Overcoming Obstacles  Participation Level:  DID NOT ATTEND   Kenneth [name] Kenneth [name] Kenneth [name] 10/20/2016, 2:24 PM
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
## 2016-10-21 ENCOUNTER — Encounter: Payer: Self-pay | Admitting: Psychiatry

## 2016-10-21 DIAGNOSIS — I10 Essential (primary) hypertension: Secondary | ICD-10-CM | POA: Diagnosis present

## 2016-10-21 NOTE — Plan of Care (Signed)
Problem: Coping: Goal: Ability to verbalize frustrations and anger appropriately will improve Outcome: Progressing Patient verbalized frustration and anger to staff.    
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
## 2016-10-21 NOTE — [facility] Group Notes (Signed)
[facility] Group Notes:  (Nursing/MHT/Case Management/Adjunct)  Date:  10/21/2016  Time:  1:36 PM  Type of Therapy:  Psychoeducational Skills  Participation Level:  Did Not Attend  Lynelle SmokeCara Travis [facility] BeachMadoni 10/21/2016, 1:36 PM

## 2016-10-21 NOTE — [facility] Group Notes (Signed)
[facility]  Date/Time: 10/21/16, 1pm  Type of Therapy/Topic:  Group Therapy:  Feelings about Diagnosis  Participation Level:  Did Not Attend   Kenneth [name] 10/21/2016, 5:08 PM

## 2016-10-21 NOTE — Progress Notes (Signed)
Recreation Therapy Notes  Date: 06.12.18 Time: 9:30 am Location: Craft Room  Group Topic: Self-expression  Goal Area(s) Addresses:  Patient will identify one color per emotion listed on wheel. Patient will verbalize benefit of using art as a means of self-expression. Patient will verbalize one emotion experienced during session. Patient will be educated on other forms of self-expression.  Behavioral Response: Did not attend  Intervention: Emotion Wheel  Activity: Patients were given an Emotion Wheel worksheet and were instructed to pick a color for each emotion listed on the wheel.  Education: LRT educated patients on other forms of self-expression.  Education Outcome: Patient did not attend group.  Clinical Observations/Feedback: Patient did not attend group.  [name],Abrar Koone M, LRT/CTRS 10/21/2016 10:24 AM

## 2016-10-21 NOTE — Progress Notes (Signed)
[facility] referral completed today.  Sharion Balloonardinal auth #[id number]. Garner NashGregory Willene Holian, MSW, LCSW Clinical Social Worker 10/21/2016 2:09 PM

## 2016-10-21 NOTE — Plan of Care (Signed)
Problem: Health Behavior/Discharge Planning: Goal: Ability to manage health-related needs will improve Outcome: Progressing Compliant with scheduled meds without encouragement
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
## 2016-10-21 NOTE — Progress Notes (Signed)
D: Pt will not respond when writer asked if he SI/HI/AVH, but noted responding to internal stimuli. Pt is hostile and irritable, using profanities and threatening staff. Patient also stated  that exactly at 0000 he will leave the facility because we're holding him against his will.Patient was redirected and explained to him that he should not try to elope he should wait till morning time to talk to MD. Pt  appears anxious and he is NOT  interacting with peers and staff appropriately.  A: Pt was offered support and encouragement. Pt was offered  scheduled medications. Pt was encouraged to attend groups. Q 15 minute checks were done for safety.  R:Pt did not attend group. Pt refused medication.Pt is not receptive to treatment, safety maintained on unit;15 minutes check will continue to monitor.

## 2016-10-22 MED ORDER — LORAZEPAM 1 MG PO TABS
1.0000 mg | ORAL_TABLET | Freq: Two times a day (BID) | ORAL | Status: DC
  Administered 2016-10-23 – 2016-10-25 (×3): 1 mg via ORAL
  Filled 2016-10-22 (×12): qty 1

## 2016-10-22 MED ORDER — PERPHENAZINE 4 MG PO TABS
16.0000 mg | ORAL_TABLET | Freq: Three times a day (TID) | ORAL | Status: DC
  Administered 2016-10-22 – 2016-12-03 (×123): 16 mg via ORAL
  Filled 2016-10-22 (×16): qty 4
  Filled 2016-10-22: qty 8
  Filled 2016-10-22 (×16): qty 4
  Filled 2016-10-22: qty 8
  Filled 2016-10-22 (×11): qty 4
  Filled 2016-10-22: qty 8
  Filled 2016-10-22 (×22): qty 4
  Filled 2016-10-22: qty 8
  Filled 2016-10-22 (×18): qty 4
  Filled 2016-10-22: qty 8
  Filled 2016-10-22 (×26): qty 4
  Filled 2016-10-22: qty 8
  Filled 2016-10-22 (×4): qty 4
  Filled 2016-10-22: qty 8
  Filled 2016-10-22 (×3): qty 4

## 2016-10-22 MED ORDER — DIPHENHYDRAMINE HCL 25 MG PO CAPS
50.0000 mg | ORAL_CAPSULE | Freq: Every day | ORAL | Status: DC
  Administered 2016-10-23 – 2016-11-14 (×5): 50 mg via ORAL
  Filled 2016-10-22 (×7): qty 2
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
## 2016-10-22 NOTE — Plan of Care (Signed)
Problem: Health Behavior/Discharge Planning: Goal: Compliance with treatment plan for underlying cause of condition will improve Outcome: Progressing Resistant to med administration as pt thought RN was giving him higher dose than ordered.  Reviewed meds and offered education, pt then compliant without issue.
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
## 2016-10-22 NOTE — Progress Notes (Signed)
[facility] and confirmed: pt is on the wait list at this time. Garner NashGregory Nyleah Mcginnis, MSW, LCSW Clinical Social Worker 10/22/2016 9:33 AM
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
## 2016-10-22 NOTE — [facility] Group Notes (Signed)
[facility] LCSW Group Therapy  10/22/2016 1:54 PM  Type of Therapy:  Group Therapy  Participation Level:  Did Not Attend  Summary of Progress/Problems: Emotional Regulation: Patients will identify both negative and positive emotions. They will discuss emotions they have difficulty regulating and how they impact their lives. Patients will be asked to identify healthy coping skills to combat unhealthy reactions to negative emotions.   Kenneth [name] G. Garnette CzechSampson MSW, LCSWA 10/22/2016, 1:55 PM

## 2016-10-22 NOTE — Progress Notes (Signed)
Recreation Therapy Notes  Date: 06.13.18 Time: 9:30 am Location: Craft Room  Group Topic: Self-esteem  Goal Area(s) Addresses:  Patient will write at least one positive trait about self. Patient will verbalize benefit of having a healthy self-esteem.  Behavioral Response: Did not attend  Intervention: I Am  Activity: Patients were given a worksheet with the letter I on it and were instructed to write as many positive traits inside the letter.  Education: LRT educated patients on ways to increase their self-esteem.  Education Outcome: Patient did not attend group.  Clinical Observations/Feedback: Patient's nurse reported patient should not be called for group therapy at this time.  [name],Caralyn Twining M, LRT/CTRS 10/22/2016 10:09 AM

## 2016-10-23 NOTE — Plan of Care (Signed)
Problem: Safety: Goal: Ability to demonstrate self-control will improve Outcome: Progressing Pt has been appropriate on the unit this evening , with no outburst.
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
## 2016-10-23 NOTE — Progress Notes (Signed)
D: Pt will not respond when writer asked if he was SI/HI/AVH, but noted responding to internal stimuli. Pt is hostile, angry, irritable,and verbally hostile to staff. Patient thoughts are disorganized, patient was noted in the room talking to self and yelling, he was frequently redirected. Pt appears less anxious and he is not interacting with peers and staff appropriately.  A: Pt was offered support and encouragement. Pt was offered  scheduled medications. Pt was encouraged to attend groups. Q 15 minute checks were done for safety.  R:Pt did not attend group. Pt refused medication.Pt is not receptive to treatment, safety maintained on unit;15 minutes check will continue to monitor.

## 2016-10-23 NOTE — [facility] Group Notes (Signed)
[facility] LCSW Group Therapy  10/23/2016 1:52 PM  Type of Therapy:  Group Therapy  Participation Level:  Did Not Attend  Summary of Progress/Problems: Balance in life: Patients will discuss the concept of balance and how it looks and feels to be unbalanced. Pt will identify areas in their life that is unbalanced and ways to become more balanced. They discussed what aspects in their lives has influenced their self care. Patients also discussed self care in the areas of self regulation/control, hygiene/appearance, sleep/relaxation, healthy leisure, healthy eating habits, exercise, inner peace/spirituality, self improvement, sobriety, and health management. They were challenged to identify changes that are needed in order to improve self care.  Rhyder Bratz G. Garnette CzechSampson MSW, LCSWA 10/23/2016, 1:52 PM

## 2016-10-23 NOTE — Progress Notes (Signed)
Patient refused am medications.  Went to patient on several different occasions to offered medication by mouth and patient refused.  Went to room with show of force and patient states "I will take my medicine by mouth"  Patient then asked "How can you force me to take medications when you are not a state hospital"  Informed patient that Dr. Requested for us to give IM if he refuseIvan Anchorsd to take medications by mouth.  Patient verbalized understanding.  Patient delusional.  Informed this Clinical research associatewriter that he is Retail bankerBarabas, king of hell and that he is lust.  Also stated that he needed some flesh.  Patient noted in his room talking loudly to unseen forces.  Support offered, safety checks maintained.
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
## 2016-10-23 NOTE — [facility] Group Notes (Signed)
[facility] Group Notes:  (Nursing/MHT/Case Management/Adjunct)  Date:  10/23/2016  Time:  10:06 PM  Type of Therapy:  Psychoeducational Skills  Participation Level:  Minimal  Participation Quality:  Sharing  Affect:  Labile  Cognitive:  Appropriate  Insight:  Limited  Engagement in Group:  Limited  Modes of Intervention:  Discussion  Summary of Progress/Problems:  Kenneth [name] 10/23/2016, 10:06 PM

## 2016-10-23 NOTE — Plan of Care (Signed)
Problem: Coping: Goal: Ability to verbalize frustrations and anger appropriately will improve Outcome: Progressing Patient verbalized some frustration to Clinical research associatewriter however the context of his conversation was delusional and untrue.

## 2016-10-23 NOTE — Progress Notes (Signed)
Recreation Therapy Notes  Date: 06.14.18 Time: 9:30 am Location: Craft Room  Group Topic: Leisure Education  Goal Area(s) Addresses:  Patient will identify things they are grateful for. Patient will identify how being grateful can influence decision making.  Behavioral Response: Did not attend  Intervention: Grateful Wheel  Activity: Patients were given an I Am Grateful For worksheet and were instructed to write things they are grateful for under each category.   Education: LRT educated patients on leisure.  Education Outcome: Patient did not attend group.   Clinical Observations/Feedback: Patient did not attend group.  [name],Ameka Krigbaum M, LRT/CTRS 10/23/2016 10:09 AM
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
## 2016-10-24 NOTE — Progress Notes (Signed)
Recreation Therapy Notes  Date: 06.15.18 Time: 9:30 am Location: Craft Room  Group Topic: Coping Skills  Goal Area(s) Addresses:  Patient will verbalize one emotion experienced in group. Patient will verbalize benefit of using art as a healthy coping skill.  Behavioral Response: Did not attend  Intervention: Coloring  Activity: Patients were given coloring sheets to color and were instructed to think about what emotions they were feeling and what their minds were focused on.  Education: LRT educated patients on healthy coping skills.  Education Outcome: Patient did not attend group.   Clinical Observations/Feedback: Patient did not attend group.  Almena Hokenson M, LRT/CTRS 10/24/2016 10:24 AM 

## 2016-10-24 NOTE — Progress Notes (Signed)
Patient is disorganized and delusional.  Continues to state that he owns the hospital.  Continues to believe that he is the king of hell.  Responding to internal stimuli.  Visible in the milieu.  Noted to interact with some peers intermittently.  Denies SI/HI/AVH.  Scheduled medications given. Support offered.

## 2016-10-24 NOTE — [facility] Group Notes (Signed)
[facility] Group Notes:  (Nursing/MHT/Case Management/Adjunct)  Date:  10/24/2016  Time:  3:59 PM  Type of Therapy:  Psychoeducational Skills  Participation Level:  Did Not Attend     Kenneth [name] 10/24/2016, 3:59 PM

## 2016-10-25 NOTE — [facility] Group Notes (Signed)
[facility] Group Notes:  (Nursing/MHT/Case Management/Adjunct)  Date:  10/25/2016  Time:  5:57 AM  Type of Therapy:  Psychoeducational Skills  Participation Level:  Active  Participation Quality:  Appropriate  Affect:  Appropriate  Cognitive:  Appropriate  Insight:  Good  Engagement in Group:  Engaged  Modes of Intervention:  Discussion, Socialization and Support  Summary of Progress/Problems:  Kenneth MilroyLaquanda Y Eunice [name] 10/25/2016, 5:57 AM

## 2016-10-25 NOTE — Plan of Care (Signed)
Problem: Health Behavior/Discharge Planning: Goal: Ability to implement measures to prevent violent behavior in the future will improve Outcome: Not Progressing Patient was approached on initial rounds and let writer know "I have had my medications for the day and I am not taking anything else".  Patient had appropriate behavior in the dayroom with peers but did not interact with staff.  He was overheard responding to unseen others when he was alone in his room.   He refused ativan and benadryl tonight.

## 2016-10-25 NOTE — [facility] Group Notes (Signed)
[facility] LCSW Group Therapy  10/25/2016 2:44 PM  Type of Therapy:  Group Therapy  Participation Level:  Patient did not attend group. CSW invited patient to group.   Summary of Progress/Problems: Coping Skills: Patients defined and discussed healthy coping skills. Patients identified healthy coping skills they would like to try during hospitalization and after discharge. CSW offered insight to varying coping skills that may have been new to patients such as practicing mindfulness.  Kenneth [name] MSW, LCSWA 10/25/2016, 2:45 PM

## 2016-10-25 NOTE — Plan of Care (Signed)
Problem: Safety: Goal: Ability to remain free from injury will improve Outcome: Progressing Pt remains free from injury.     
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
## 2016-10-25 NOTE — Plan of Care (Signed)
Problem: Safety: Goal: Ability to demonstrate self-control will improve Outcome: Progressing Patient was dressed and appearance was neat.  He continues to stay to himself in his room and is overheard, responding to unseen others.  He interacts minimally with peers.  He did join the group at snack time.  He continues to refuse Benadryl and ativan at bedtime.  He does not engage in superficial conversation with staff.

## 2016-10-25 NOTE — Progress Notes (Signed)
Pt stated to RN , " I know your real name its witch of death. You are on my side I am Lucifer". Pt is compliant with all am and noon medications. He remains paranoid and delusional. He is focused on discharge. No aggressive behaviors noted. Will continue to monitor for safety.

## 2016-10-26 DIAGNOSIS — F17200 Nicotine dependence, unspecified, uncomplicated: Secondary | ICD-10-CM
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
## 2016-10-26 NOTE — [facility] Group Notes (Signed)
[facility] Group Notes:  (Nursing/MHT/Case Management/Adjunct)  Date:  10/26/2016  Time:  11:05 PM  Type of Therapy:  Evening Wrap-up Group  Participation Level:  Did Not Attend  Participation Quality:  N/A  Affect:  N/A  Cognitive:  N/A  Insight:  None  Engagement in Group:  Did Not Attend  Modes of Intervention:  Activity and Discussion  Summary of Progress/Problems:  Kenneth MorrowChelsea [name] Kenneth [name] 10/26/2016, 11:05 PM

## 2016-10-26 NOTE — Progress Notes (Signed)
Denies depression.  Continue to state that he does not need to be here.  Presented to nurses station at one point requesting to sign himself out.  Informed that he could not because he is IVC.  Patient asked to see paperwork. IVC papers shown.   Patient became upset stated that he has to talk to his lawyers and then returned to his room.  No interaction with peers noted today.  Support offered.  Safety maintained.

## 2016-10-26 NOTE — [facility] Group Notes (Signed)
[facility] LCSW Group Therapy  10/26/2016 2:08 PM  Type of Therapy:  Group Therapy  Participation Level:  Minimal  Participation Quality:  Attentive  Affect:  Appropriate  Cognitive:  Alert  Insight:  None  Engagement in Therapy:  Limited  Modes of Intervention:  Discussion, Education, Problem-solving, Reality Testing, Socialization and Support  Summary of Progress/Problems: Goal Setting: The objective is to set goals as they relate to the crisis in which they were admitted. Patients will be using SMART goal modalities to set measurable goals. Characteristics of realistic goals will be discussed and patients will be assisted in setting and processing how one will reach their goal. Facilitator will also assist patients in applying interventions and coping skills learned in psycho-education groups to the SMART goal and process how one will achieve defined goal. Patient came to group briefly and stated he was feeling good today. Patient left group after about 5 minutes and did not return.   Daltin Crist G. Garnette CzechSampson MSW, LCSWA 10/26/2016, 2:11 PM

## 2016-10-27 NOTE — Progress Notes (Signed)
Continues to be delusional.  Medication compliant.  Preoccupied with what the doctor is saying about him.  No irritability noted and no loud outburst this shift.  Support offered.  Safety rounds maintained.
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
## 2016-10-27 NOTE — Plan of Care (Signed)
Problem: Coping: Goal: Ability to verbalize frustrations and anger appropriately will improve Outcome: Progressing Patient kept to himself most of this evening and stayed in his room instead of going outside.  He continues to refuse Ativan and benadryl at bedtime.  He initiated taking a shower and was slightly more pleasant on contact this evening.

## 2016-10-27 NOTE — Plan of Care (Signed)
Problem: Health Behavior/Discharge Planning: Goal: Compliance with treatment plan for underlying cause of condition will improve Outcome: Progressing Medication compliant.
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
## 2016-10-28 NOTE — Progress Notes (Signed)
   10/28/16 1400  Clinical Encounter Type  Visited With Patient;Other (Comment)  Visit Type Follow-up;Psychological support;Spiritual support;Behavioral Health (Group)   Patient showed up for group led by Chaplain. Patient did participate in the final activity.

## 2016-10-28 NOTE — [facility] Group Notes (Signed)
Goals Group Date/Time: 10/28/2016 9:00 AM Type of Therapy and Topic: Group Therapy: Goals Group: SMART Goals   Participation Level: Did not attend    Glennon MacSara P Yanina Knupp,  LCSW 10/28/2016, 5:01 PM

## 2016-10-28 NOTE — [facility] Group Notes (Signed)
[facility] Group Notes:  (Nursing/MHT/Case Management/Adjunct)  Date:  10/28/2016  Time:  1:51 PM  Type of Therapy:  Psychoeducational Skills  Participation Level:  Did Not Attend  Lynelle SmokeCara Travis [facility]ad[facility] 10/28/2016, 1:51 PM
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
## 2016-10-28 NOTE — Progress Notes (Signed)
D: Pt denies SI/HI/AVH. Pt is irritable, angry  and uncooperative with treatment plan. Pt appears anxious and he is not interacting with peers and staff appropriately.  A: Pt was offered support and encouragement. Pt was given scheduled medications. Pt was encouraged to attend groups. Q 15 minute checks were done for safety.  R:Pt did not attends group. Patient refused nighttime medication. Pt is not receptive to treatment. Safety maintained on unit, will continue to monitor.

## 2016-10-28 NOTE — Plan of Care (Signed)
Problem: Nutrition: Goal: Adequate nutrition will be maintained Outcome: Progressing Patient consumes > 50% of meals and po fluids are encourged.

## 2016-10-28 NOTE — [facility] Group Notes (Signed)
[facility] Group Notes:  (Nursing/MHT/Case Management/Adjunct)  Date:  10/28/2016  Time:  12:42 AM  Type of Therapy:  Group Therapy  Participation Level:  Did Not Attend  Participation Quality: Summary of Progress/Problems:  Mayra NeerJackie L Micharl Helmes 10/28/2016, 12:42 AM

## 2016-10-28 NOTE — Progress Notes (Signed)
Patient up as lib with a steady gait, alert and oriented to self, place and time. In room majority of the time responding to internal stimuli, patient is mostly  Irritable but at times can be pleasant. Does not interact with peers, does not attend any group sessions. Denies SI/HI. Patient remains safe on the unit with q 15 minute checks.

## 2016-10-28 NOTE — [facility] Group Notes (Signed)
[facility] LCSW Group Therapy Note  Date/Time:10/28/2016, 3pm  Type of Therapy/Topic:  Group Therapy:  Feelings about Diagnosis  Participation Level:  Did Not Attend    Glennon MacSara P Yalitza Teed, LCSW 10/28/2016, 5:02 PM

## 2016-10-29 NOTE — Progress Notes (Signed)
CSW phone call with Helmut [facility]licia [facility]  One of her team members is going to come out to make contact with pt Thursday or Friday.  They are planning to continue to work with him upon discharge. Garner NashGregory Kwana Ringel, MSW, LCSW Clinical Social Worker 10/29/2016 3:16 PM

## 2016-10-29 NOTE — Plan of Care (Signed)
Problem: Safety: Goal: Ability to remain free from injury will improve Outcome: Progressing Pt displays safe behaviors while in hospital commits to safety.    

## 2016-10-29 NOTE — [facility] Group Notes (Signed)
  [facility]  Date/Time:10/29/2016 1PM  Type of Therapy/Topic:  Group Therapy:  Emotion Regulation  Participation Level:  Did Not Attend    Glennon MacSara P Monseratt Ledin, LCSW 10/29/2016, 3:10 PM
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
## 2016-10-29 NOTE — Progress Notes (Signed)
Pt observed in room at times responding to internal stimuli. However, he has not reported to nurse any delusions. Pt is calm and cooperative. He is medication and meal compliant.Pt refused am scheduled ativan stated it makes him feel "tired" He denies SI and HI. Will continue to monitor for safety.

## 2016-10-30 NOTE — Progress Notes (Signed)
D: Pt appears less agitated over all but spends most of the shift in his room.  Refuses HS med's.  Denies s/i, h/i or hallucinations but appears preoccupied and responding to internal stimuli. A: Pt was provided with his space to decrease agitation with staff but offered assistance as needed. Monitored on 15 minute checks and maintains safety on the unit. R: Med non compliant tonight but reports sedation from them.  Has been med compliant with antipsychotic on day shift.  Monitored safety and cont tx plan.

## 2016-10-30 NOTE — Progress Notes (Signed)
Pt appeared to sleep for 6 hours while monitored on 15 minute safety checks.

## 2016-10-30 NOTE — Progress Notes (Signed)
Pt noted responding and talking in room and responding to internal stimuli. Less agitated and aggressive. Denies SI, HI. Pt denies AVH although noted responding. Pt med compliant.  Encouragement and support offered. Safety checks maintained. Medications given as prescribed. Pt receptive and remains safe on unit with q 15 min checks.

## 2016-10-30 NOTE — [facility] Group Notes (Signed)
[facility] LCSW Group Therapy Note  Type of Therapy and Topic:  Group Therapy:  Goals Group: SMART Goals  Participation Level:  Patient did not attend group. CSW invited patient to group.   Description of Group:   The purpose of a daily goals group is to assist and guide patients in setting recovery/wellness-related goals.  The objective is to set goals as they relate to the crisis in which they were admitted. Patients will be using SMART goal modalities to set measurable goals.  Characteristics of realistic goals will be discussed and patients will be assisted in setting and processing how one will reach their goal. Facilitator will also assist patients in applying interventions and coping skills learned in psycho-education groups to the SMART goal and process how one will achieve defined goal.  Therapeutic Goals: -Patients will develop and document one goal related to or their crisis in which brought them into treatment. -Patients will be guided by LCSW using SMART goal setting modality in how to set a measurable, attainable, realistic and time sensitive goal.  -Patients will process barriers in reaching goal. -Patients will process interventions in how to overcome and successful in reaching goal.   Summary of Patient Progress:  Patient Goal: None identified at this time.    Therapeutic Modalities:   Motivational Interviewing Cognitive Behavioral Therapy Crisis Intervention Model SMART goals setting  Merna Baldi G. Lavaun Greenfield MSW, LCSWA 10/30/2016 11:01 AM 

## 2016-10-30 NOTE — Progress Notes (Signed)
[facility].  [facility] remains on [facility]. Garner NashGregory Shawni Volkov, MSW, LCSW Clinical Social Worker 10/30/2016 3:00 PM

## 2016-10-30 NOTE — Progress Notes (Signed)
D: Pt spends the shift in his room.  Heard responding to internal stimuli.  Loudly having a conversation and he is alone in his room.  Refused HS med's but is much more calm upon approach.  Pt less agitated.  Denies s/i, h/i or hallucinations but responding to internal stimuli. A: Pt encouraged to be medication compliant.  Pt offered support.  Pt monitored on 15 minute safety checks and maintained safety. R: Pt's mental status improved, still some psychosis. Monitor safety and cont tx plan.
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
## 2016-10-30 NOTE — [facility] Group Notes (Signed)
[facility] LCSW Group Therapy  10/30/2016 3:28 PM  Type of Therapy:  Group Therapy  Participation Level:  Patient did not attend group. CSW invited patient to group.   Summary of Progress/Problems: Balance in life: Patients will discuss the concept of balance and how it looks and feels to be unbalanced. Pt will identify areas in their life that is unbalanced and ways to become more balanced. They discussed what aspects in their lives has influenced their self care. Patients also discussed self care in the areas of self regulation/control, hygiene/appearance, sleep/relaxation, healthy leisure, healthy eating habits, exercise, inner peace/spirituality, self improvement, sobriety, and health management. They were challenged to identify changes that are needed in order to improve self care.  Mckyle Solanki G. Garnette CzechSampson MSW, LCSWA 10/30/2016, 3:28 PM

## 2016-10-30 NOTE — Plan of Care (Signed)
Problem: Safety: Goal: Ability to demonstrate self-control will improve Outcome: Progressing No aggressive behavior, calm cooperative, med compliant

## 2016-10-30 NOTE — Plan of Care (Signed)
Problem: Health Behavior/Discharge Planning: Goal: Ability to manage health-related needs will improve Outcome: Not Progressing Pt is resistant to interaction and is labile
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
## 2016-10-31 NOTE — [facility] Group Notes (Signed)
[facility] LCSW Group Therapy  10/31/2016 1:35 PM  Type of Therapy:  Group Therapy  Participation Level:  Patient did not attend group. CSW invited patient to group.   Summary of Progress/Problems: Feelings around Relapse. Group members discussed the meaning of relapse and shared personal stories of relapse, how it affected them and others, and how they perceived themselves during this time. Group members were encouraged to identify triggers, warning signs and coping skills used when facing the possibility of relapse. Social supports were discussed and explored in detail. Patients also discussed facing disappointment and how that can trigger someone to relapse.  Tyana Butzer G. Garnette CzechSampson MSW, LCSWA 10/31/2016, 1:35 PM

## 2016-10-31 NOTE — Progress Notes (Signed)
Pt appeared to sleep about 7 hours while monitored on 15 minute safety checks. 

## 2016-10-31 NOTE — Progress Notes (Signed)
Denies SI/HI/AVH.  Patient remains delusional.  Observed in his room having conversations with unseen persons.   Affect blunted.  Minimal  Interaction noted with peers.  Tends to isolate to his room.  Support offered.  Safety maintained.l
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
## 2016-11-01 NOTE — [facility] Group Notes (Signed)
[facility] LCSW Group Therapy  11/01/2016 1:59 PM  Type of Therapy:  Group Therapy  Participation Level:  Patient did not attend group. CSW invited patient to group.   Summary of Progress/Problems: Coping Skills: Patients defined and discussed healthy coping skills. Patients identified healthy coping skills they would like to try during hospitalization and after discharge. CSW offered insight to varying coping skills that may have been new to patients such as practicing mindfulness.  Kenneth [name] G. Garnette CzechSampson MSW, LCSWA 11/01/2016, 1:59 PM

## 2016-11-01 NOTE — Plan of Care (Signed)
Problem: Coping: Goal: Ability to demonstrate self-control will improve Outcome: Progressing Pt   Problem: Health Behavior/Discharge Planning: Goal: Compliance with treatment plan for underlying cause of condition will improve Outcome: Progressing Compliant with meds with minimal encouragement

## 2016-11-01 NOTE — Plan of Care (Signed)
Problem: Safety: Goal: Ability to demonstrate self-control will improve Outcome: Progressing Patient was approached at the beginning of this shift.  He denied having "any needs" but requested a Gatorade.  He was given information about Benadryl ordered for bedtime and refused the medication.  He stayed in his room except for snacks.  He was pleasant on contact.  He continues to respond to unseen others when alone in his room.

## 2016-11-01 NOTE — Progress Notes (Signed)
Pt calm and cooperative this morning.  During morning meds, pt states, "Call me Kenneth [name]."  RN asked who Clarita LeberShamrock was, pt states, "Don't play dumb with me."  Med compliant.  RN observed multiple occasions of pt talking loudly alone in room, appearing to have lengthy  discussion with someone.  No behavioral issues.
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
## 2016-11-02 NOTE — Plan of Care (Signed)
Problem: Coping: Goal: Ability to verbalize frustrations and anger appropriately will improve Outcome: Progressing Patient continues to be overheard speaking to unseen others when alone in his room.  Behavior outside of the room is appropriate and he fits in with his peers although he interacts minimally. He continue to refuse Benadryl at bedtime and conveys paranoid behavior when encouraged to try Benadryl for sleep.

## 2016-11-02 NOTE — Plan of Care (Signed)
Problem: Safety: Goal: Ability to demonstrate self-control will improve Outcome: Progressing Pt maintained control of behavior without encouragement

## 2016-11-02 NOTE — [facility] Group Notes (Signed)
[facility] Group Notes:  (Nursing/MHT/Case Management/Adjunct)  Date:  11/02/2016  Time:  10:43 PM  Type of Therapy:  Psychoeducational Skills  Participation Level:  Did Not Attend  Foy GuadalajaraJasmine R Marrian Bells 11/02/2016, 10:43 PM

## 2016-11-02 NOTE — Progress Notes (Signed)
Pt calm and cooperative this morning.  Reported to med room for AM meds without prompting.  Continues to have conversations with unseen others in room while alone.  In milieu, minimal interaction is observed but is appropriate.  More pleasant and polite than prior interactions, says "Thank you."  RN later overhead pt yelling/roaring loudly in his room alone.  RN checked on pt - he responded calmly and pleasantly, "Yes, I'm fine."  No behavioral issues.

## 2016-11-02 NOTE — [facility] Group Notes (Signed)
[facility] LCSW Group Therapy  11/02/2016 2:48 PM  Type of Therapy:  Group Therapy  Participation Level:  Patient did not attend group. CSW invited patient to group.   Summary of Progress/Problems: Communications: Patients identify how individuals communicate with one another appropriately and inappropriately. Patients will be guided to discuss their thoughts, feelings, and behaviors related to barriers when communicating. The group will process together ways to execute positive and appropriate communications.   Kenneth [name] G. Garnette CzechSampson MSW, LCSWA 11/02/2016, 2:48 PM

## 2016-11-03 NOTE — Plan of Care (Signed)
Problem: Safety: Goal: Ability to redirect hostility and anger into socially appropriate behaviors will improve Outcome: Progressing Patient has not displayed any anger towards staff or peer. Easily redirectable.

## 2016-11-03 NOTE — Plan of Care (Signed)
Problem: Coping: Goal: Ability to verbalize frustrations and anger appropriately will improve Outcome: Progressing Patient was approached at his room at the beginning of this shift.  He was not loud, as he usually is when addressed, and acknowledged Clinical research associatewriter by name.  He continues to respond to unseen others when alone in the room.  Behavior is appropriate when he makes brief trips to the dayroom for fluid and food.
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
## 2016-11-03 NOTE — Progress Notes (Signed)
Recreation Therapy Notes  Date: 06.25.18 Time: 9:30 am Location: Craft Room  Group Topic: Self-expression  Goal Area(s) Addresses:  Patient will identify one color per emotion listed on wheel. Patient will verbalize benefit of using art as a means of self-expression. Patient will verbalize one emotion experienced during session. Patient will be educated on other forms of self-expression.  Behavioral Response: Did not attend  Intervention: Emotion Wheel  Activity: Patients were given an Emotion Wheel worksheet and were instructed to pick a color for each emotion listed on the wheel.  Education: LRT educated patients on other forms of self-expression.  Education Outcome: Patient did not attend group.   Clinical Observations/Feedback: Patient did not attend group.  [name],Saw Mendenhall M, LRT/CTRS 11/03/2016 10:15 AM

## 2016-11-04 NOTE — Plan of Care (Signed)
Problem: Health Behavior/Discharge Planning: Goal: Ability to manage health-related needs will improve Outcome: Not Progressing Pt more compliant but not able to manage his own needs.
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
## 2016-11-04 NOTE — Progress Notes (Signed)
Pt continues to respond to internal stimuli in room. Pt is calm and cooperative. He comes up to medication room to take medications. Visible on unit at times. He commits to safety no aggressive behaviors noted. Will continue to monitor.

## 2016-11-04 NOTE — Progress Notes (Signed)
Pt appeared to sleep 5.25 hours while monitored on 15 minute safety checks.

## 2016-11-04 NOTE — [facility] Group Notes (Signed)
Goals Group Date/Time: 11/04/2016 9:00 AM Type of Therapy and Topic: Group Therapy: Goals Group: SMART Goals   Participation Level: DID NOT ATTEND    Glennon MacSara P Murlin Schrieber, LCSW 11/04/2016, 5:17 PM

## 2016-11-04 NOTE — [facility] Group Notes (Signed)
[facility] LCSW Group Therapy Note  Date/Time: 11/04/2016, 3:00PM  Type of Therapy/Topic:  Group Therapy:  Feelings about Diagnosis  Participation Level:  Did Not Attend    Description of Group:    This group will allow patients to explore their thoughts and feelings about diagnoses they have received. Patients will be guided to explore their level of understanding and acceptance of these diagnoses. Facilitator will encourage patients to process their thoughts and feelings about the reactions of others to their diagnosis, and will guide patients in identifying ways to discuss their diagnosis with significant others in their lives. This group will be process-oriented, with patients participating in exploration of their own experiences as well as giving and receiving support and challenge from other group members.   Therapeutic Goals: 1. Patient will demonstrate understanding of diagnosis as evidence by identifying two or more symptoms of the disorder:  2. Patient will be able to express two feelings regarding the diagnosis 3. Patient will demonstrate ability to communicate their needs through discussion and/or role plays    Therapeutic Modalities:   Cognitive Behavioral Therapy Brief Therapy Feelings Identification   Hampton AbbotKadijah Hilliary Jock, MSW, LCSW-A 11/04/2016, 4:12PM

## 2016-11-04 NOTE — Progress Notes (Signed)
Recreation Therapy Notes  Date: 06.26.18 Time: 9:30 am Location: Craft Room  Group Topic: Goal Setting  Goal Area(s) Addresses:  Patient will write at least one goal. Patient will write at least one obstacle.  Behavioral Response: Did not attend  Intervention: Recovery Goal Chart  Activity: Patients were instructed to make a Recovery Goal Chart including their goals, obstacles, the date they started working on their goals, and the date they achieved their goals.  Education: LRT educated patients on healthy ways to celebrate reaching their goals.  Education Outcome: Patient did not attend group.   Clinical Observations/Feedback: Patient did not attend group.  [name],Kelden Lavallee M, LRT/CTRS 11/04/2016 10:20 AM

## 2016-11-04 NOTE — Progress Notes (Signed)
Pt isolative in his room.  Continues to self dialogue loudly in his room and does not socialize with anyone else.  Pt did not want to take HS meds but had been compliant during the day. Pt agrees to safety on the unit.  Monitored on 15 minute safety checks and maintained safety.

## 2016-11-04 NOTE — Plan of Care (Signed)
Problem: Safety: Goal: Ability to remain free from injury will improve Outcome: Progressing pt remains safe and commits to safety

## 2016-11-05 NOTE — Progress Notes (Signed)
Pt appeared to sleep 6 hours while monitored on 15 minute safety checks. 

## 2016-11-05 NOTE — Progress Notes (Signed)
Pt continues to be calmer and more pleasant to staff.  Continues responding loudly to internal stimuli. Did not want to take his HS Benadryl. Spent most of the evening in his room. Pt agrees to safety. Monitored on 15 minute safety checks.

## 2016-11-05 NOTE — Plan of Care (Signed)
Problem: Coping: Goal: Ability to verbalize frustrations and anger appropriately will improve Outcome: Progressing No aggressive behaviors noted.

## 2016-11-05 NOTE — Progress Notes (Signed)
   11/05/16 1035  Clinical Encounter Type  Visited With Patient;Health care provider  Visit Type Behavioral Health;Other (Comment) (Treatment team)  Stress Factors  Patient Stress Factors Financial concerns;Lack of knowledge;Loss   Chaplain present during treatment team meeting.

## 2016-11-05 NOTE — Progress Notes (Signed)
Patient is pleasant & cooperative.Isolated in his room most of the time.Patient talks to himself in his room.Patient states "I never had any suicidal thoughts".Compliant with medications.Did not attend groups.Appetite & energy level good.Support & encouragement given.
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
## 2016-11-05 NOTE — [facility] Group Notes (Signed)
[facility] LCSW Group Therapy  11/05/2016 2:23 PM  Type of Therapy:  Group Therapy  Participation Level:  Patient did not attend group. CSW invited patient to group.   Summary of Progress/Problems: Emotional Regulation: Patients will identify both negative and positive emotions. They will discuss emotions they have difficulty regulating and how they impact their lives. Patients will be asked to identify healthy coping skills to combat unhealthy reactions to negative emotions.   Lorah Kalina G. Garnette CzechSampson MSW, LCSWA 11/05/2016, 2:24 PM

## 2016-11-05 NOTE — Progress Notes (Signed)
Recreation Therapy Notes  Date: 06.27.18 Time: 9:30 am Location: Craft Room  Group Topic: Self-esteem  Goal Area(s) Addresses:  Patient will write at least one positive trait. Patient will verbalize benefit of having a healthy self-esteem.  Behavioral Response: Did not attend  Intervention: I Am  Activity: Patients were given a worksheet with the letter I on it and were instructed to write positive traits about themselves inside the letter.  Education: LRT educated patient on ways to increase their self-esteem.  Education Outcome: Patient did not attend group.  Clinical Observations/Feedback: Patient did not attend group.  Alexsandra Shontz M, LRT/CTRS 11/05/2016 10:10 AM 
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
## 2016-11-06 NOTE — Plan of Care (Signed)
Problem: Education: Goal: Ability to verbalize precipitating factors for violent behavior will improve Outcome: Not Progressing Refused to discuss

## 2016-11-06 NOTE — Plan of Care (Signed)
Problem: Safety: Goal: Ability to redirect hostility and anger into socially appropriate behaviors will improve Outcome: Progressing Has remained calm, mostly in room

## 2016-11-06 NOTE — Progress Notes (Signed)
Patient  Was often in room. Stayed around but had limited interactions with staff and peers. Refused his bedtime medications and did not explain why. Went to bed and has been sleeping. Safety precautions maintained.

## 2016-11-06 NOTE — Progress Notes (Signed)
Recreation Therapy Notes  Date: 06.28.18 Time: 9:30 am Location: Craft Room  Group Topic: Leisure Education  Goal Area(s) Addresses:  Patient will identify things they are grateful for. Patient will identify how being grateful can influence decision making.  Behavioral Response: Did not attend  Intervention: Grateful Wheel  Activity: Patients were given an I Am Grateful For worksheet and were instructed to write things they are grateful for under each category.  Education: LRT educated patients on leisure.   Education Outcome: Patient did not attend group.  Clinical Observations/Feedback: Patient did not attend group.  [name],Anaily Ashbaugh M, LRT/CTRS 11/06/2016 10:23 AM

## 2016-11-06 NOTE — [facility] Group Notes (Signed)
[facility] LCSW Group Therapy  11/06/2016 1:59 PM  Type of Therapy:  Group Therapy  Participation Level:  Patient did not attend group. CSW invited patient to group.   Summary of Progress/Problems: Balance in life: Patients will discuss the concept of balance and how it looks and feels to be unbalanced. Pt will identify areas in their life that is unbalanced and ways to become more balanced. They discussed what aspects in their lives has influenced their self care. Patients also discussed self care in the areas of self regulation/control, hygiene/appearance, sleep/relaxation, healthy leisure, healthy eating habits, exercise, inner peace/spirituality, self improvement, sobriety, and health management. They were challenged to identify changes that are needed in order to improve self care.  Kenneth [name] MSW, LCSWA 11/06/2016, 2:01 PM

## 2016-11-06 NOTE — [facility] Group Notes (Signed)
[facility] LCSW Group Therapy Note  Type of Therapy and Topic:  Group Therapy:  Goals Group: SMART Goals  Participation Level:   Patient did not attend group. CSW invited patient to group.   Description of Group:   The purpose of a daily goals group is to assist and guide patients in setting recovery/wellness-related goals.  The objective is to set goals as they relate to the crisis in which they were admitted. Patients will be using SMART goal modalities to set measurable goals.  Characteristics of realistic goals will be discussed and patients will be assisted in setting and processing how one will reach their goal. Facilitator will also assist patients in applying interventions and coping skills learned in psycho-education groups to the SMART goal and process how one will achieve defined goal.  Therapeutic Goals: -Patients will develop and document one goal related to or their crisis in which brought them into treatment. -Patients will be guided by LCSW using SMART goal setting modality in how to set a measurable, attainable, realistic and time sensitive goal.  -Patients will process barriers in reaching goal. -Patients will process interventions in how to overcome and successful in reaching goal.   Summary of Patient Progress:  Patient Goal: None identified at this time, patient did not attend group on this date.    Therapeutic Modalities:   Motivational Interviewing Engineer, manufacturing systemsCognitive Behavioral Therapy Crisis Intervention Model SMART goals setting  Katleen Carraway G. Garnette CzechSampson MSW, [facility] OhioCSWA 11/06/2016 10:35 AM

## 2016-11-06 NOTE — Plan of Care (Signed)
Problem: Coping: Goal: Ability to verbalize frustrations and anger appropriately will improve Patient states about taking medications; "I'm not taking them; I don't need them."  Outcome: Progressing Able to express his needs appropriately

## 2016-11-06 NOTE — Plan of Care (Signed)
Problem: Safety: Goal: Ability to remain free from injury will improve Outcome: Progressing Safety precautions maintained   

## 2016-11-06 NOTE — Plan of Care (Signed)
Problem: Pain Managment: Goal: General experience of comfort will improve Outcome: Progressing Denying pain

## 2016-11-06 NOTE — Plan of Care (Signed)
Problem: Health Behavior/Discharge Planning: Goal: Ability to manage health-related needs will improve Outcome: Progressing Understand treatment process and remains compliant

## 2016-11-06 NOTE — Plan of Care (Signed)
Problem: Coping: Goal: Ability to demonstrate self-control will improve Outcome: Progressing Patient is pleasant and cooperative.  No loud outburst.  Takes medications without any verbal complaints.

## 2016-11-06 NOTE — Progress Notes (Signed)
Patient has been visible in the milieu periodically.  No interaction noted with peers.  Scheduled medications given.  Continues to be delusionally.  Continues to talk to unseen persons in his room although conversations are calm at this time.  No inappropriate behavior noted.  No group attendance.  Support offered.  Safety checks maintained.

## 2016-11-07 NOTE — [facility] Group Notes (Signed)
[facility] LCSW Group Therapy  11/07/2016 1:55 PM  Type of Therapy:  Group Therapy  Participation Level:  Patient did not attend group. CSW invited patient to group.   Summary of Progress/Problems: Feelings around Relapse. Group members discussed the meaning of relapse and shared personal stories of relapse, how it affected them and others, and how they perceived themselves during this time. Group members were encouraged to identify triggers, warning signs and coping skills used when facing the possibility of relapse. Social supports were discussed and explored in detail. Patients also discussed facing disappointment and how that can trigger someone to relapse.  Delman Goshorn G. Garnette CzechSampson MSW, LCSWA 11/07/2016, 1:55 PM

## 2016-11-07 NOTE — Plan of Care (Signed)
Problem: Activity: Goal: Sleeping patterns will improve Outcome: Progressing Patient slept for Estimated Hours of 3.45; q15 minutes safety round maintained, no injury or falls during this shift.    
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
## 2016-11-07 NOTE — Progress Notes (Signed)
CSW met with Dr Rosita Fire of [facility] ACT team and pt.  PT was appropriate and Dr Rosita Fire had numerous questions for him as [facility] is considering whether to continue to work with him while he lives in independent living setting.  CSW, Dr. Rosita Fire and Dr Mamie Nick then met afterwards.  Bottom line from Dr Rosita Fire is that she does not feel comfortable working with pt while he is in independent living and continues to recommend [facility] as a better option.  She is recommending a group home placement if [facility] does not materialize. Winferd Humphrey, MSW, LCSW Clinical Social Worker 11/07/2016 1:25 PM

## 2016-11-07 NOTE — Progress Notes (Signed)
Patient was visible on the unit pacing the halls and occassionally sitting in dayroom, but stays isolative to room most of shift. Patient is alert and oriented x 4, breathing unlabored, and extremities x 4 within normal limits. Patient is calm and cooperative. Patient did not display any disruptive behavior.. Patient continues to be monitored on 15 minute safety checks. Will continue to monitor patient and notify MD of any changes.

## 2016-11-07 NOTE — Progress Notes (Addendum)
Patient ID: Kenneth [name], male   DOB: 14-Apr-1966, 51 y.o.   MRN: [id number] Receptive, pleasant, improving in mood and affect, still psychotic but noted for less AV/H, decrease internal stimuli, not as dismissive, less grandiosity, still needs encouragement to shower; medication compliant at bedtime.

## 2016-11-07 NOTE — Progress Notes (Addendum)
Phone message given to CSW by RN from Weber CooksGreg Pickett at [facility].  [phone].  CSW returned call but only able to leave message. Garner NashGregory Gerline Ratto, MSW, LCSW Clinical Social Worker 11/07/2016 2:44 PM   Weber CooksGreg Pickett called back and asked for update on client.  Tammy SoursGreg is inpt unit care coordinator.  Regular care coordinator is Kerry HoughBen Zweigle, [phone].  Tammy SoursGreg asked for recent MD progress notes and is going to staff the case with Romeo AppleBen and with their MD to discuss appropriate plan.  He is also going to speak to [facility] about pt. Garner NashGregory Khadeja Abt, MSW, LCSW Clinical Social Worker 11/07/2016 3:11 PM

## 2016-11-07 NOTE — Plan of Care (Signed)
Problem: Safety: Goal: Periods of time without injury will increase Outcome: Progressing Patient continues to remain safe and without injury during hospitalization and is on Q 15 minute monitoring. Will continue to monitor.   

## 2016-11-07 NOTE — Progress Notes (Signed)
Recreation Therapy Notes  Date: 06.29.18 Time: 9:30 am Location: Craft Room  Group Topic: Coping Skills  Goal Area(s) Addresses:  Patient will verbalize one emotion experienced in group. Patient will verbalize benefit of using art as a coping skill.  Behavioral Response: Did not attend  Intervention: Coloring  Activity: Patients were given coloring sheets to color while listening to music. Patients were instructed to think about the emotions they were feeling and what their minds were focused on.  Education: LRT educated patients on healthy coping skills.  Education Outcome: Patient did not attend group.  Clinical Observations/Feedback: Patient did not attend group.  Trudy Kory M, LRT/CTRS 11/07/2016 10:19 AM 

## 2016-11-07 NOTE — [facility] Group Notes (Signed)
[facility] Group Notes:  (Nursing/MHT/Case Management/Adjunct)  Date:  11/07/2016  Time:  4:36 AM  Type of Therapy:  Psychoeducational Skills  Participation Level:  Did Not Attend  Summary of Progress/Problems:  Chancy MilroyLaquanda Y Emari Demmer 11/07/2016, 4:36 AM

## 2016-11-08 NOTE — [facility] Group Notes (Signed)
[facility] LCSW Group Therapy  11/08/2016 3:09 PM  Type of Therapy:  Group Therapy  Participation Level:  Did Not Attend  Summary of Progress/Problems: Communications: Patients identify how individuals communicate with one another appropriately and inappropriately. Patients will be guided to discuss their thoughts, feelings, and behaviors related to barriers when communicating. The group will process together ways to execute positive and appropriate communications.   Roslin Norwood G. Garnette CzechSampson MSW, LCSWA 11/08/2016, 3:09 PM

## 2016-11-08 NOTE — Plan of Care (Signed)
Problem: Coping: Goal: Ability to verbalize frustrations and anger appropriately will improve Outcome: Progressing Patient verbalized feelings to staff.    

## 2016-11-08 NOTE — [facility] Group Notes (Signed)
[facility] Group Notes:  (Nursing/MHT/Case Management/Adjunct)  Date:  11/08/2016  Time:  5:24 AM  Type of Therapy:  Psychoeducational Skills  Participation Level:  Did Not Attend  Summary of Progress/Problems:  Kenneth MilroyLaquanda Y Jameel [name] 11/08/2016, 5:24 AM

## 2016-11-08 NOTE — Plan of Care (Signed)
Problem: Safety: Goal: Ability to remain free from injury will improve Outcome: Progressing Fall precautions maintained   
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
## 2016-11-08 NOTE — Progress Notes (Signed)
Patient was seen in the milieu, cooperative on approach but refused his bedtime medication" I take it as needed, so I don't need it now". Patient avoided to discuss in details. He was otherwise active in the milieu. Denying suicidal/homicidal thoughts. Appears to be preoccupied. Was encouraged to express his concerns as needed. Therapeutic milieu promoted. Currently in bed resting. No sign of discomfort. Safety precautions maintained.

## 2016-11-08 NOTE — Plan of Care (Signed)
Problem: Safety: Goal: Periods of time without injury will increase Outcome: Progressing Patient continues to remain safe and without injury during hospitalization and is on Q 15 minute monitoring. Will continue to monitor.

## 2016-11-08 NOTE — Plan of Care (Signed)
Problem: Health Behavior/Discharge Planning: Goal: Ability to manage health-related needs will improve Outcome: Not Progressing Not taking some of his medications. Refused bed time medication

## 2016-11-08 NOTE — Plan of Care (Signed)
Problem: Pain Managment: Goal: General experience of comfort will improve Outcome: Progressing Pain assessed as indicate. Pt denies pain

## 2016-11-08 NOTE — Plan of Care (Signed)
Problem: Education: Goal: Ability to verbalize precipitating factors for violent behavior will improve Outcome: Not Progressing Pt refusing

## 2016-11-08 NOTE — Progress Notes (Signed)
D: Pt denies SI/HI/AVH. Pt is pleasant and cooperative, affect is flat but brightens upon approach. Pt appears less anxious and he is interacting with peers and staff appropriately.  A: Pt was offered support and encouragement. Pt was given scheduled medications. Pt was encouraged to attend groups. Q 15 minute checks were done for safety.  R:Pt did not attend group. Pt is taking medication. Pt has no complaints.Pt receptive to treatment and safety maintained on unit.   

## 2016-11-08 NOTE — Plan of Care (Signed)
Problem: Physical Regulation: Goal: Will remain free from infection Outcome: Progressing Infection precautions maintained   

## 2016-11-08 NOTE — Progress Notes (Signed)
Patient was visible on the unit sitting in dayroom watching TV at times, but isolative to room. Patient is compliant with medication, meals, and snacks. Patient is alert and oriented x 4, breathing unlabored, and extremities x 4 within normal limits. Patient is calm and cooperative. Patient did not display any disruptive behavior.Patient continues to be monitored on 15 minute safety checks. Will continue to monitor patient and notify MD of any changes.
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
## 2016-11-09 NOTE — Plan of Care (Signed)
Problem: Safety: Goal: Ability to remain free from injury will improve Outcome: Progressing Pt remains safe in hospital. Pt displays safe behaviors   

## 2016-11-09 NOTE — Progress Notes (Signed)
Pt denies SI, HI, a/v hallucinations. Pt compliant with all medications.Pt has not voices any bizarre statements today so far. He is in a good mood but can be isolative to room. Pt does not attend groups. Will continue to monitor.

## 2016-11-09 NOTE — [facility] Group Notes (Signed)
[facility] Group Notes:  (Nursing/MHT/Case Management/Adjunct)  Date:  11/09/2016  Time:  12:48 AM  Type of Therapy:  Group Therapy  Participation Level:  Did Not Attend  Participation Quality:Summary of Progress/Problems:  Kenneth [name] [name] 11/09/2016, 12:48 AM 

## 2016-11-09 NOTE — [facility] Group Notes (Signed)
[facility] LCSW Group Therapy  11/09/2016 2:27 PM  Type of Therapy:  Group Therapy  Participation Level:  Patient did not attend group. CSW invited patient to group.   Summary of Progress/Problems: Stress management: Patients defined and discussed the topic of stress and the related symptoms and triggers for stress. Patients identified healthy coping skills they would like to try during hospitalization and after discharge to manage stress in a healthy way. CSW offered insight to varying stress management techniques.   Haidyn Kilburg G. Garnette CzechSampson MSW, LCSWA 11/09/2016, 2:27 PM

## 2016-11-10 NOTE — Progress Notes (Signed)
On assessment this morning, pt is requesting discharge stating, "I'm doing everything yall ask me to." On approach though, pt reports "whoever told you those bad things is lying on me. It's not that I'm paranoid, I know they're lying about me." Pt later on heard in his room alone, speaking aloud, carrying on conversation with himself. Pt is compliant with medications as ordered. Adamantly requests to take all his pills together (sublingual zyprexa and perphenazine, see MAR). Support and encouragement provided. Medications administered with education as ordered. Safety maintained with every 15 minute checks. Will continue to monitor.

## 2016-11-10 NOTE — Plan of Care (Signed)
Problem: Safety: Goal: Ability to redirect hostility and anger into socially appropriate behaviors will improve Outcome: Progressing Pt does not display angry/hostile behaviors, but does minimally interact with peers.

## 2016-11-10 NOTE — [facility] Group Notes (Signed)
[facility] Group Notes:  (Nursing/MHT/Case Management/Adjunct)  Date:  11/10/2016  Time:  12:31 AM  Type of Therapy:  Evening Wrap-up Group  Participation Level:  Did Not Attend  Participation Quality:  N/A  Affect:  N/A  Cognitive:  N/A  Insight:  None  Engagement in Group:  Did Not Attend  Modes of Intervention:  Activity  Summary of Progress/Problems:  Tomasita MorrowChelsea Nanta Manjot Hinks 11/10/2016, 12:31 AM

## 2016-11-10 NOTE — Plan of Care (Signed)
Problem: Coping: Goal: Ability to verbalize frustrations and anger appropriately will improve Outcome: Progressing Patient was approached in his room at 1935 and overheard talking loudly to unseen others.  He was pleasant to writer on contact and asked for a Gatorade.  He had snack with peers this evening but did not engage.  Patient refused Benadryl tonight.
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
## 2016-11-10 NOTE — Progress Notes (Signed)
Recreation Therapy Notes  Date: 07.02.18 Time: 9:30 am Location: Craft Room  Group Topic: Self-esteem  Goal Area(s) Addresses:  Patient will effectively use art as a means of self-expression. Patient will recognize positive benefit of self-expression. Patient will be able to identify one emotion experienced during group.  Behavioral Response: Did not attend   Intervention: Two Face of Me  Activity: Patients were given a blank face worksheet and were instructed to draw a line down the middle. On one side of the worksheet, patients were instructed to draw or write how they felt when they were admitted and on the other side, they were instructed to draw or write how they felt when they were discharged.  Education: LRT educated patients on different forms of self-expression.  Education Outcome: Patient did not attend group.   Clinical Observations/Feedback: Patient did not attend group.  [name],Axtyn Woehler M, LRT/CTRS 11/10/2016 10:27 AM

## 2016-11-10 NOTE — Plan of Care (Signed)
Problem: Education: Goal: Ability to verbalize precipitating factors for violent behavior will improve Outcome: Not Progressing Pt with no insight regarding behaviors. Continues to be delusional, somewhat paranoid although improvement is noted.

## 2016-11-11 MED ORDER — TUBERCULIN PPD 5 UNIT/0.1ML ID SOLN
5.0000 [IU] | Freq: Once | INTRADERMAL | Status: AC
  Administered 2016-11-11: 5 [IU] via INTRADERMAL
  Filled 2016-11-11: qty 0.1

## 2016-11-11 NOTE — Plan of Care (Signed)
Problem: Safety: Goal: Ability to redirect hostility and anger into socially appropriate behaviors will improve Outcome: Progressing Pt has pleasant mood and cooperative with staff. No aggressive behaviors noted.

## 2016-11-11 NOTE — [facility] Group Notes (Signed)
  [facility] LCSW Group Therapy Note  Date/Time:11/11/16  Type of Therapy/Topic:  Group Therapy:  Feelings about Diagnosis  Participation Level:  Did Not Attend    Keondrick Dilks P Casy Brunetto, LCSW 11/11/2016, 5:00 PM   

## 2016-11-11 NOTE — Plan of Care (Signed)
Problem: Coping: Goal: Ability to verbalize frustrations and anger appropriately will improve Outcome: Progressing Patient verbalized feelings to staff.    

## 2016-11-11 NOTE — Progress Notes (Signed)
TB test given 1235 pm R forearm.
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
## 2016-11-11 NOTE — [facility] Group Notes (Signed)
Goals Group  Date/Time: 11/11/2016, 9:00 AM Type of Therapy and Topic: Group Therapy: Goals Group: SMART Goals  ?  Participation Level: Did not attend  Description of Group:  ?  The purpose of a daily goals group is to assist and guide patients in setting recovery/wellness-related goals. The objective is to set goals as they relate to the crisis in which they were admitted. Patients will be using SMART goal modalities to set measurable goals. Characteristics of realistic goals will be discussed and patients will be assisted in setting and processing how one will reach their goal. Facilitator will also assist patients in applying interventions and coping skills learned in psycho-education groups to the SMART goal and process how one will achieve defined goal.  ?  Therapeutic Goals:  ?  -Patients will develop and document one goal related to or their crisis in which brought them into treatment.  -Patients will be guided by LCSW using SMART goal setting modality in how to set a measurable, attainable, realistic and time sensitive goal.  -Patients will process barriers in reaching goal.  -Patients will process interventions in how to overcome and successful in reaching goal.  ?  Patient's Goal: Did not attend  ?  Therapeutic Modalities:  Motivational Interviewing  Cognitive Behavioral Therapy  Crisis Intervention Model  SMART goals setting   Hampton AbbotKadijah Toddrick Sanna, MSW, LCSW-A 11/11/2016, 11:43AM

## 2016-11-11 NOTE — Progress Notes (Signed)
Patient ID: Kenneth [name], male   DOB: [birth year], 51 y.o.   MRN: [id number]  CSW and doctor met with Pt who verbalizes wanting to be discharged.  Talked with Pt about how he is currently on [facility] wait list.  He does not want to got there but is agreeable to living in a group home temporarily to be discharged.  Pt requested that he go back to [facility] group home. CSW agreed to call and ask if Pt would be able to return. CSW left a message with Mr. Humphries, no return call yet at this time.  Dossie Arbour, LCSW

## 2016-11-11 NOTE — Progress Notes (Signed)
Pt observed in room talking to self responding to internal stimuli. Pt is cooperative and compliant with all medications. He denies SI, HI, a/v hallucinations. He is visible on unit at times. Will continue to monitor.

## 2016-11-11 NOTE — Progress Notes (Signed)
Recreation Therapy Notes  Date: 07.03.18 Time: 9:30 am Location: Craft Room  Group Topic: Goal Setting  Goal Area(s) Addresses:  Patient will identify at least one goal. Patient will identify at least one obstacle.  Behavioral Response: Did not attend  Intervention: Recovery Goal Chart  Activity: Patients were instructed to make a Recovery Goal Chart including goals, obstacles, the date they started working on their goals, and the date they achieved their goals.  Education: LRT educated patients on healthy ways to celebrate achieving their goals.  Education Outcome: Patient did not attend group.   Clinical Observations/Feedback: Patient did not attend group.  [name],Daiquan Resnik M, LRT/CTRS 11/11/2016 10:27 AM

## 2016-11-11 NOTE — Progress Notes (Signed)
D: Pt denies SI/HI/AVH, bur noted responding to internal stimuli while alone in his room. Pt is pleasant and cooperative, affect is flat but brightens upon approach. Patient was not hostile or aggressive towards staff.  A: Pt was offered support and encouragement. Pt was given scheduled medications. Pt was encouraged to attend groups. Q 15 minute checks were done for safety.  R:Pt did not attend group. Pt is taking medication. Pt has no complaints.Pt receptive to treatment and safety maintained on unit.

## 2016-11-12 NOTE — Tx Team (Signed)
Interdisciplinary Treatment and Diagnostic Plan Update  11/10/2016 Time of Session: 10:30am Kenneth MasseBenjamin M [name] MRN: [id number]  Principal Diagnosis: Undifferentiated schizophrenia [facility](HCC)  Secondary Diagnoses: Principal Problem:   Undifferentiated schizophrenia (HCC) Active Problems:   Noncompliance   Tobacco use disorder   HTN (hypertension)   Current Medications:  Current Facility-Administered Medications  Medication Dose Route Frequency Provider Last Rate Last Dose  . acetaminophen (TYLENOL) tablet 650 mg  650 mg Oral Q6H PRN [name], Jagannath, MD      . alum & mag hydroxide-simeth (MAALOX/MYLANTA) 200-200-20 MG/5ML suspension 30 mL  30 mL Oral Q4H PRN [name], Jagannath, MD      . diphenhydrAMINE (BENADRYL) capsule 50 mg  50 mg Oral QHS Jimmy FootmanHernandez-Gonzalez, Andrea, MD   50 mg at 11/07/16 2228  . magnesium hydroxide (MILK OF MAGNESIA) suspension 30 mL  30 mL Oral Daily PRN [name], Jagannath, MD      . OLANZapine [facility](ZYPREXA) injection 10 mg  10 mg Intramuscular Daily [name], [name], MD   10 mg at 10/16/16 1411  . OLANZapine zydis (ZYPREXA) disintegrating tablet 30 mg  30 mg Oral Daily [name], [name], MD   30 mg at 11/12/16 0807  . perphenazine (TRILAFON) tablet 16 mg  16 mg Oral TID Jimmy FootmanHernandez-Gonzalez, Andrea, MD   16 mg at 11/12/16 1153  . tuberculin injection 5 Units  5 Units Intradermal Once [name], [name], MD   5 Units at 11/11/16 1235   PTA Medications: Prescriptions Prior to Admission  Medication Sig Dispense Refill Last Dose  . OLANZapine (ZYPREXA) 10 MG tablet Take 30 mg by mouth at bedtime.   unknown at unknown  . perphenazine (TRILAFON) 8 MG tablet Take 1 tablet (8 mg total) by mouth 3 (three) times daily. 90 tablet 3 unknown at unknown  . perphenazine (TRILAFON) 8 MG tablet Take 1.5 tablets (12 mg total) by mouth 3 (three) times daily. 135 tablet 1 unknown at unknown    Patient Stressors: Medication change or noncompliance Other: Conflict with his ACCT  Team member  Patient Strengths: Capable of independent living Physical Health  Treatment Modalities: Medication Management, Group therapy, Case management,  1 to 1 session with clinician, Psychoeducation, Recreational therapy.   Physician Treatment Plan for Primary Diagnosis: Undifferentiated schizophrenia (HCC) Long Term Goal(s): Improvement in symptoms so as ready for discharge NA   Short Term Goals: Ability to identify changes in lifestyle to reduce recurrence of condition will improve Ability to verbalize feelings will improve Ability to disclose and discuss suicidal ideas Ability to demonstrate self-control will improve Ability to identify and develop effective coping behaviors will improve Compliance with prescribed medications will improve Ability to identify triggers associated with substance abuse/mental health issues will improve NA  Medication Management: Evaluate patient's response, side effects, and tolerance of medication regimen.  Therapeutic Interventions: 1 to 1 sessions, Unit Group sessions and Medication administration.  Evaluation of Outcomes: Progressing  Physician Treatment Plan for Secondary Diagnosis: Principal Problem:   Undifferentiated schizophrenia (HCC) Active Problems:   Noncompliance   Tobacco use disorder   HTN (hypertension)  Long Term Goal(s): Improvement in symptoms so as ready for discharge NA   Short Term Goals: Ability to identify changes in lifestyle to reduce recurrence of condition will improve Ability to verbalize feelings will improve Ability to disclose and discuss suicidal ideas Ability to demonstrate self-control will improve Ability to identify and develop effective coping behaviors will improve Compliance with prescribed medications will improve Ability to identify triggers associated with substance abuse/mental health issues  will improve NA     Medication Management: Evaluate patient's response, side effects,  and tolerance of medication regimen.  Therapeutic Interventions: 1 to 1 sessions, Unit Group sessions and Medication administration.  Evaluation of Outcomes: Progressing   RN Treatment Plan for Primary Diagnosis: Undifferentiated schizophrenia (HCC) Long Term Goal(s): Knowledge of disease and therapeutic regimen to maintain health will improve  Short Term Goals: Ability to identify and develop effective coping behaviors will improve and Compliance with prescribed medications will improve  Medication Management: RN will administer medications as ordered by provider, will assess and evaluate patient's response and provide education to patient for prescribed medication. RN will report any adverse and/or side effects to prescribing provider.  Therapeutic Interventions: 1 on 1 counseling sessions, Psychoeducation, Medication administration, Evaluate responses to treatment, Monitor vital signs and CBGs as ordered, Perform/monitor CIWA, COWS, AIMS and Fall Risk screenings as ordered, Perform wound care treatments as ordered.  Evaluation of Outcomes: Progressing   LCSW Treatment Plan for Primary Diagnosis: Undifferentiated schizophrenia (HCC) Long Term Goal(s): Safe transition to appropriate next level of care at discharge, Engage patient in therapeutic group addressing interpersonal concerns.  Short Term Goals: Engage patient in aftercare planning with referrals and resources and Increase skills for wellness and recovery  Therapeutic Interventions: Assess for all discharge needs, 1 to 1 time with Social worker, Explore available resources and support systems, Assess for adequacy in community support network, Educate family and significant other(s) on suicide prevention, Complete Psychosocial Assessment, Interpersonal group therapy.  Evaluation of Outcomes: Progressing   Progress in Treatment: Attending groups: No Participating in groups: No Taking medication as prescribed:  Yes.  Toleration medication: Yes. Family/Significant other contact made: No, will contact:  when given permission Patient understands diagnosis: No Discussing patient identified problems/goals with staff: Yes. Medical problems stabilized or resolved: Yes. Denies suicidal/homicidal ideation: Yes. Issues/concerns per patient self-inventory: No. Other: none  New problem(s) identified: No, Describe:  none  New Short Term/Long Term Goal(s): PT goal: I want to be discharged, willing to consider group home if it means he can leave  Discharge Plan or Barriers: Pt will be discharged to group home with ACT team support or to group home placement with ACTT.  Reason for Continuation of Hospitalization: Delusions  Medication stabilization Coordination of aftercare plan Estimated Length of Stay: 5-7 days  Attendees: Patient:Kenneth [name] 11/10/2016 4:44 PM  Physician: Kristine Linea 11/10/2016 4:44 PM  Nursing: Leone Payor, RN 11/10/2016 4:44 PM  RN Care Manager: 11/10/2016 4:44 PM  Social Worker: Jake Shark, LCSW 11/10/2016 4:44 PM  Recreational Therapist: Hershal Coria, LRT 11/10/2016 4:44 PM  Other:    Other:    Other:     Scribe for Treatment Team: Glennon Mac, LCSW 11/10/2016 4:44 PM

## 2016-11-12 NOTE — Plan of Care (Signed)
Problem: Coping: Goal: Ability to verbalize frustrations and anger appropriately will improve Outcome: Progressing Patient was approached at the beginning of this shift.  He had good eye contact and was polite.  He refused benadryl and stated "I don't have any problems sleeping".  He returned to his room except to come out at snack time for food.  He was overheard speaking to unseen others while in his room alone. Behavior on the unit was not disruptive.

## 2016-11-12 NOTE — Plan of Care (Signed)
Problem: Safety: Goal: Ability to remain free from injury will improve Outcome: Progressing Patient remains safe on the unit   

## 2016-11-12 NOTE — [facility] Group Notes (Signed)
[facility] Group Notes:  (Nursing/MHT/Case Management/Adjunct)  Date:  11/12/2016  Time:  11:29 PM  Type of Therapy:  Psychoeducational Skills  Participation Level:  Did Not Attend  Summary of Progress/Problems:  Kenneth [name] 11/12/2016, 11:29 PM
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
## 2016-11-12 NOTE — Progress Notes (Signed)
   11/12/16 0900  Clinical Encounter Type  Visited With Patient  Visit Type Follow-up;Behavioral Health;Spiritual support  Spiritual Encounters  Spiritual Needs Emotional  Stress Factors  Patient Stress Factors Loss;Financial concerns;Health changes  Family Stress Factors None identified   Follow-up. CH found pt talking to himself and assisted in grounding him back in the present moment. CH provided emotional support and pt shared his discharge plans to be able to go stay in a group home. Pt stated that he must show that he is not a threat to himself or others to be able to live on his own again.

## 2016-11-12 NOTE — [facility] Group Notes (Signed)
[facility] Group Notes:  (Nursing/MHT/Case Management/Adjunct)  Date:  11/12/2016  Time:  7:12 AM  Type of Therapy:  Psychoeducational Skills  Participation Level:  Did Not Attend   Summary of Progress/Problems:  Chancy MilroyLaquanda Y Renna Kilmer 11/12/2016, 7:12 AM

## 2016-11-12 NOTE — Progress Notes (Signed)
Patient denies SI/HI/AVH but noted responding to internal stimuli. Patient is pleasant and cooperative. Patient was visible on the unit for meals/snacks and medications. Patient is alert and oriented x 4, breathing unlabored and extremities WNL x 4. Patient did not exhibit any disruptive behavior. Will continue to monitor patient and notify MD of any changes.

## 2016-11-13 NOTE — Progress Notes (Signed)
Pt displays improved participation and improved frustration tolerance. Asked staff if he was due for medication which he has mostly refused HS med's.  Pleasant and cooperative upon approach.  Socially appropriate behavior noted. Pt denies s/i or h/i.  Some responding to internal stimuli noted.Monitored on 15 minute safety checks and maintained safety.

## 2016-11-13 NOTE — Plan of Care (Signed)
Problem: Education: Goal: Ability to verbalize precipitating factors for violent behavior will improve Outcome: Completed/Met Date Met: 11/13/16 Pt has not been violent in a long period of time  Problem: Coping: Goal: Ability to verbalize frustrations and anger appropriately will improve Patient states about taking medications; "I'm not taking them; I don't need them."  Outcome: Progressing Pt asked staff if he needed meds  Problem: Health Behavior/Discharge Planning: Goal: Ability to implement measures to prevent violent behavior in the future will improve Outcome: Progressing Has not been violent and has improved frustration tol  Problem: Safety: Goal: Ability to demonstrate self-control will improve Outcome: Progressing Much better self comtrol Goal: Ability to redirect hostility and anger into socially appropriate behaviors will improve Outcome: Progressing Has not been hostile in weeks  Problem: Activity: Goal: Interest or engagement in activities will improve Outcome: Progressing Has started attending groups Goal: Sleeping patterns will improve Outcome: Progressing Pt's sleep pattern has improved with decrease in sx's  Problem: Education: Goal: Ability to verbalize precipitating factors for violent behavior will improve Outcome: Not Progressing Does not have insight into this  Problem: Coping: Goal: Ability to verbalize frustrations and anger appropriately will improve Outcome: Progressing Able to control anger better  Problem: Health Behavior/Discharge Planning: Goal: Ability to implement measures to prevent violent behavior in the future will improve Outcome: Progressing Med's have improved his anger and violence  Problem: Safety: Goal: Ability to demonstrate self-control will improve Outcome: Progressing Improved self control displayed  Goal: Ability to redirect hostility and anger into socially appropriate behaviors will improve Outcome: Progressing Pt  attending groups and improved behavior
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
## 2016-11-13 NOTE — Progress Notes (Signed)
Pt appeared to sleep 7.25 hours while monitored on 15 minute safety checks. 

## 2016-11-13 NOTE — Progress Notes (Signed)
Recreation Therapy Notes  Date: 07.05.18 Time: 9:30 am Location: Craft Room  Group Topic: Leisure Education  Goal Area(s) Addresses:  Patient will identify things they are grateful for. Patient will identify how being grateful can influence decision making.  Behavioral Response: Did not attend  Intervention: Grateful Wheel  Activity: Patients were given an I Am Grateful For worksheet and were instructed to write things they are grateful for under each category.  Education: LRT educated patients on leisure.  Education Outcome: Patient did not attend group.   Clinical Observations/Feedback: Patient did not attend group.  [name],Heath Badon M, LRT/CTRS 11/13/2016 10:09 AM

## 2016-11-13 NOTE — Plan of Care (Signed)
Problem: Safety: Goal: Ability to demonstrate self-control will improve Outcome: Progressing Patient able to demonstrate self control Goal: Ability to redirect hostility and anger into socially appropriate behaviors will improve Outcome: Progressing Patient has not had any outbursts on the unit.

## 2016-11-13 NOTE — Progress Notes (Signed)
Patient up ad lib with steady gait. Compliant with medications and meals. Presents to medication room  without prompting. Denies SI or AH but patient continues to respond to internal stimuli. Does not attend any group sessions, isolates self to room majority of the day. Safety intact, 15 minute checks performed by staff.

## 2016-11-13 NOTE — [facility] Group Notes (Signed)
[facility] LCSW Group Therapy Note  Type of Therapy and Topic:  Group Therapy:  Goals Group: SMART Goals  Participation Level:  Patient did not attend group. CSW invited patient to group.   Description of Group:   The purpose of a daily goals group is to assist and guide patients in setting recovery/wellness-related goals.  The objective is to set goals as they relate to the crisis in which they were admitted. Patients will be using SMART goal modalities to set measurable goals.  Characteristics of realistic goals will be discussed and patients will be assisted in setting and processing how one will reach their goal. Facilitator will also assist patients in applying interventions and coping skills learned in psycho-education groups to the SMART goal and process how one will achieve defined goal.  Therapeutic Goals: -Patients will develop and document one goal related to or their crisis in which brought them into treatment. -Patients will be guided by LCSW using SMART goal setting modality in how to set a measurable, attainable, realistic and time sensitive goal.  -Patients will process barriers in reaching goal. -Patients will process interventions in how to overcome and successful in reaching goal.   Summary of Patient Progress:  Patient Goal: None identified at this time.   Therapeutic Modalities:   Motivational Interviewing Engineer, manufacturing systemsCognitive Behavioral Therapy Crisis Intervention Model SMART goals setting  Kenneth Dunson G. Garnette CzechSampson MSW, LCSWA 11/13/2016 2:07 PM

## 2016-11-13 NOTE — [facility] Group Notes (Signed)
[facility] LCSW Group Therapy   11/13/2016 1pm   Type of Therapy: Group Therapy   Participation Level: Patient invited but did not attend.  Hampton AbbotKadijah Cassidi Modesitt, MSW, LCSWA 11/13/2016, 3:59PM

## 2016-11-14 NOTE — Plan of Care (Signed)
Problem: Activity: Goal: Interest or engagement in activities will improve Outcome: Progressing Pt seen in the milieu, interacting with peers appropriately
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
## 2016-11-14 NOTE — Plan of Care (Signed)
Problem: Safety: Goal: Ability to demonstrate self-control will improve Outcome: Progressing Pt able  To express his feelings appropriately

## 2016-11-14 NOTE — Progress Notes (Signed)
Patient visible in the milieu, cooperative and able to express his needs. Pt is alert and oriented and thought process organized. Denying thoughts of self harm. Has no concern so far staff continue to provide support and encouragements. Safety precautions maintained.

## 2016-11-14 NOTE — Progress Notes (Signed)
D:Pt denies SI/HI/AVH, bur noted responding to internal stimuli while alone in his room. Pt is pleasant and cooperative, affect is flat but brightens upon approach. Patient was not hostile or aggressive towards staff.  A:Pt was offered support and encouragement. Pt was given scheduled medications. Pt was encouraged to attend groups. Q 15 minute checks were done for safety.  R:Pt did not attendgroup.Pt is taking medication. Pt has no complaints.Pt receptive to treatment and safety maintained on

## 2016-11-14 NOTE — Plan of Care (Signed)
Problem: Activity: Goal: Interest or engagement in activities will improve Outcome: Progressing Visible in group activities. Able to communicate appropriately

## 2016-11-14 NOTE — Plan of Care (Signed)
Problem: Health Behavior/Discharge Planning: Goal: Ability to implement measures to prevent violent behavior in the future will improve Outcome: Progressing Cooperative. Maintains positive attitude.

## 2016-11-14 NOTE — Plan of Care (Signed)
Problem: Safety: Goal: Ability to redirect hostility and anger into socially appropriate behaviors will improve Outcome: Progressing No anger issue: pt maintains appropriate behavior

## 2016-11-14 NOTE — Progress Notes (Signed)
Recreation Therapy Notes  Date: 07.06.18 Time: 9:30 am Location: Craft Room  Group Topic: Coping Skills  Goal Area(s) Addresses:  Patient will verbalize at least one emotion while participating in group. Patient will verbalize benefit of using art as a coping skill.  Behavioral Response: Did not attend  Intervention: Coloring  Activity: Patients were given coloring sheets to color and were instructed to think about what emotions they were feeling and what their minds were focused on.  Education: LRT educated patients on healthy coping skills.  Education Outcome: Patient did not attend group.  Clinical Observations/Feedback: Patient did not attend group.  Jessyka Austria M, LRT/CTRS 11/14/2016 10:31 AM 

## 2016-11-14 NOTE — Plan of Care (Signed)
Problem: Coping: Goal: Ability to verbalize frustrations and anger appropriately will improve Patient states about taking medications; "I'm not taking them; I don't need them."  Outcome: Progressing Patient verbalized frustration
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
## 2016-11-14 NOTE — Plan of Care (Signed)
Problem: Coping: Goal: Ability to verbalize frustrations and anger appropriately will improve Patient states about taking medications; "I'm not taking them; I don't need them."  Outcome: Not Progressing Pt shows improved behavior control and ability to verbalize feelings

## 2016-11-14 NOTE — Plan of Care (Signed)
Problem: Coping: Goal: Ability to verbalize frustrations and anger appropriately will improve Patient states about taking medications; "I'm not taking them; I don't need them."  Outcome: Progressing Currently Cooperative, willing to follow expectations

## 2016-11-14 NOTE — Plan of Care (Signed)
Problem: Safety: Goal: Periods of time without injury will increase Outcome: Progressing No self harm behavior displayed. Safety precautions maintained.

## 2016-11-14 NOTE — Plan of Care (Signed)
Problem: Safety: Goal: Ability to redirect hostility and anger into socially appropriate behaviors will improve Outcome: Progressing Patient able to redirect hostility.

## 2016-11-14 NOTE — Progress Notes (Signed)
Pt visible on unit, interacts with select staff and peers.  Brightens on approach.  More pleasant than in prior interactions, opened door for RN.  Pt reports he is "ready to get out of here."  "I feel better, but I still get mean sometimes."  Pt did not elaborate further.  Med compliant.  Pt continues to talk loudly in room when alone, but stops abruptly if approached.  No behavioral issues.

## 2016-11-14 NOTE — Plan of Care (Signed)
Problem: Coping: Goal: Ability to verbalize frustrations and anger appropriately will improve Outcome: Progressing Patient verbalized frustration to staff.    

## 2016-11-15 DIAGNOSIS — F17210 Nicotine dependence, cigarettes, uncomplicated: Secondary | ICD-10-CM

## 2016-11-15 DIAGNOSIS — G4700 Insomnia, unspecified: Secondary | ICD-10-CM

## 2016-11-15 DIAGNOSIS — F203 Undifferentiated schizophrenia: Principal | ICD-10-CM

## 2016-11-15 DIAGNOSIS — R443 Hallucinations, unspecified: Secondary | ICD-10-CM

## 2016-11-15 DIAGNOSIS — I10 Essential (primary) hypertension: Secondary | ICD-10-CM

## 2016-11-15 LAB — LIPID PANEL
CHOL/HDL RATIO: 4 ratio
Cholesterol: 256 mg/dL — ABNORMAL HIGH (ref 0–200)
HDL: 64 mg/dL (ref >40)
LDL CALC: 151 mg/dL — AB (ref 0–99)
Triglycerides: 204 mg/dL — ABNORMAL HIGH (ref <150)
VLDL: 41 mg/dL — AB (ref 0–40)

## 2016-11-15 LAB — TSH: TSH: 2.781 u[IU]/mL (ref 0.350–4.500)

## 2016-11-15 NOTE — [facility] Group Notes (Signed)
[facility] Group Notes:  (Nursing/MHT/Case Management/Adjunct)  Date:  11/15/2016  Time:  6:19 AM  Type of Therapy:  Psychoeducational Skills  Participation Level:  Did Not Attend  Summary of Progress/Problems:  Kenneth [name] Y [name] 11/15/2016, 6:19 AM 

## 2016-11-15 NOTE — [facility] Group Notes (Signed)
[facility] LCSW Group Therapy  11/15/2016 2:06 PM  Type of Therapy:  Group Therapy  Participation Level:  Patient did not attend group. CSW invited patient to group.   Summary of Progress/Problems: Feelings around Relapse. Group members discussed the meaning of relapse and shared personal stories of relapse, how it affected them and others, and how they perceived themselves during this time. Group members were encouraged to identify triggers, warning signs and coping skills used when facing the possibility of relapse. Social supports were discussed and explored in detail. Patients also discussed facing disappointment and how that can trigger someone to relapse.  Kenneth [name] G. Garnette CzechSampson MSW, LCSWA 11/15/2016, 2:06 PM
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
## 2016-11-15 NOTE — [facility] Group Notes (Signed)
[facility] LCSW Group Therapy  11/15/2016 2:00 PM  Type of Therapy:  Group Therapy  Participation Level:  Patient did not attend group. CSW invited patient to group.   Summary of Progress/Problems: Feelings around Relapse. Group members discussed the meaning of relapse and shared personal stories of relapse, how it affected them and others, and how they perceived themselves during this time. Group members were encouraged to identify triggers, warning signs and coping skills used when facing the possibility of relapse. Social supports were discussed and explored in detail. Patients also discussed facing disappointment and how that can trigger someone to relapse.  Eirik Schueler G. Garnette CzechSampson MSW, LCSWA  11/15/2016, 2:05 PM

## 2016-11-15 NOTE — Progress Notes (Signed)
Patient remained medication compliant and interacted with peers and staff. He appeared bright affected today. He was however heard talking to self while in his room. Medication education done and patient verbalized understanding. Will continue to monitor.

## 2016-11-15 NOTE — Plan of Care (Signed)
Problem: Health Behavior/Discharge Planning: Goal: Compliance with treatment plan for underlying cause of condition will improve Outcome: Progressing Patient medication complaint and actually was able to inform writer what his medications were.

## 2016-11-16 NOTE — Progress Notes (Signed)
Patient currently in bed sleeping. Refused Benadryl stating "I take it when I need it..so I don't need it tonight". Patient has no other concern. Remains safe in room.

## 2016-11-16 NOTE — Progress Notes (Signed)
Continues to spend the day in his room, speaking loudly in conversations to himself with no one else present. Medication compliant. Pleasant, calm and cooperative. Appropriately, but minimally interacts with staff/peers. Reports good sleep, appetite. Showered. Support and encouragement provided. Medications administered as ordered with education. Safety maintained with every 15 minute checks. Will continue to monitor.

## 2016-11-16 NOTE — Progress Notes (Signed)
Patient is visible in the milieu. Alert and oriented, calm and cooperative. Denies suicidal/homicidal thoughts. Currently denying hallucinations. Thought process organized. Pt is able to communicate his feelings and concerns as needed. Pt is involved in activities and interacting with staff and peers appropriately. Has no concern so far. Staff continue to provide support and encouragements. Safety precautions maintained.

## 2016-11-16 NOTE — Plan of Care (Signed)
Problem: Activity: Goal: Sleeping patterns will improve Outcome: Progressing Sleeps all night   

## 2016-11-16 NOTE — Plan of Care (Signed)
Problem: Health Behavior/Discharge Planning: Goal: Ability to implement measures to prevent violent behavior in the future will improve Outcome: Progressing Maintains appropriate behavior

## 2016-11-16 NOTE — Plan of Care (Signed)
Problem: Safety: Goal: Periods of time without injury will increase Outcome: Progressing No self harm behavior   
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
## 2016-11-16 NOTE — Plan of Care (Signed)
Problem: Coping: Goal: Ability to verbalize frustrations and anger appropriately will improve Patient states about taking medications; "I'm not taking them; I don't need them."  Outcome: Progressing Pt improving compliance with treatment plan: attending groups and taking medications

## 2016-11-16 NOTE — Plan of Care (Signed)
Problem: Safety: Goal: Ability to redirect hostility and anger into socially appropriate behaviors will improve Outcome: Progressing Able to communicate feelings appropriately

## 2016-11-16 NOTE — Plan of Care (Signed)
Problem: Coping: Goal: Ability to verbalize frustrations and anger appropriately will improve Patient states about taking medications; "I'm not taking them; I don't need them."  Outcome: Progressing Patient displays good eye contact and approaches staff assertively and without verbal aggression. He has minimal request for "a gatorade"  (his usual HS request) and continues to refuse his Benadryl ordered for sleep.  Behavior in the dayroom with peers is unremarkable.

## 2016-11-16 NOTE — [facility] Group Notes (Signed)
[facility] Group Notes:  (Nursing/MHT/Case Management/Adjunct)  Date:  11/16/2016  Time:  7:26 AM  Type of Therapy:  Psychoeducational Skills  Participation Level:  Did Not Attend  Summary of Progress/Problems:  Kenneth [name] 11/16/2016, 7:26 AM 

## 2016-11-16 NOTE — [facility] Group Notes (Signed)
[facility] LCSW Group Therapy  11/16/2016 2:22 PM  Type of Therapy:  Group Therapy  Participation Level:  Patient did not attend group. CSW invited patient to group.   Summary of Progress/Problems: Self esteem: Patients discussed self esteem and how it impacts them. They discussed what aspects in their lives has influenced their self esteem. They were challenged to identify changes that are needed in order to improve self esteem. Patients participated in activity where they had to identify positive adjectives they felt described their personality. Patients shared with the group on the following areas: Things I am good at, What I like about my appearance, I've helped others by, What I value the most, compliments I have received, challenges I have overcome, thing that make me unique, and Times I've made others happy.   Antwaun Buth G. Garnette CzechSampson MSW, LCSWA 11/16/2016, 2:23 PM

## 2016-11-16 NOTE — Plan of Care (Signed)
Problem: Safety: Goal: Ability to demonstrate self-control will improve Outcome: Progressing No sign of aggressive behavior.

## 2016-11-17 LAB — HEMOGLOBIN A1C
Hgb A1c MFr Bld: 5.8 % — ABNORMAL HIGH (ref 4.8–5.6)
MEAN PLASMA GLUCOSE: 120 mg/dL

## 2016-11-17 NOTE — [facility] Group Notes (Signed)
[facility] Group Notes:  (Nursing/MHT/Case Management/Adjunct)  Date:  11/17/2016  Time:  3:54 PM  Type of Therapy:  Psychoeducational Skills  Participation Level:  Did Not Attend  Naelle Diegel C Tunis Gentle 11/17/2016, 3:54 PM 

## 2016-11-17 NOTE — Progress Notes (Signed)
Patient is alert and oriented to person, place and time. Skin is warm, dry and intact. No limitations to all four extremities noted. Patient currently denies HI/SI at this time, but still responding to internal stimuli. Medication is taken by patient without difficulty nor noted side affects. Patient was observed ambulating in hall during the shift with a steady gait. Attends meals with selective peer interaction noted. VS WNL, milieu remains therapeutic. Patient will be monitored and physician notified of any acute changes.

## 2016-11-17 NOTE — Plan of Care (Signed)
Problem: Coping: Goal: Ability to verbalize frustrations and anger appropriately will improve Patient states about taking medications; "I'm not taking them; I don't need them."  Outcome: Progressing Patient able to verbalize frustrations and anger to staff appropriately.  Problem: Safety: Goal: Ability to demonstrate self-control will improve Outcome: Progressing Able to demonstrate self-control when he doesn't agree with a particular issue.

## 2016-11-17 NOTE — [facility] Group Notes (Signed)
[facility] Group Notes:  (Nursing/MHT/Case Management/Adjunct)  Date:  11/17/2016  Time:  6:21 AM  Type of Therapy:  Psychoeducational Skills  Participation Level:  Did Not Attend   Summary of Progress/Problems:  Kenneth MilroyLaquanda Y [name] 11/17/2016, 6:21 AM

## 2016-11-17 NOTE — [facility] Group Notes (Signed)
[facility] LCSW Group Therapy Note  Date/Time: 11/17/16, 1300  Type of Therapy and Topic:  Group Therapy:  Overcoming Obstacles  Participation Level:  Pt did not attend group.  Description of Group:    In this group patients will be encouraged to explore what they see as obstacles to their own wellness and recovery. They will be guided to discuss their thoughts, feelings, and behaviors related to these obstacles. The group will process together ways to cope with barriers, with attention given to specific choices patients can make. Each patient will be challenged to identify changes they are motivated to make in order to overcome their obstacles. This group will be process-oriented, with patients participating in exploration of their own experiences as well as giving and receiving support and challenge from other group members.  Therapeutic Goals: 1. Patient will identify personal and current obstacles as they relate to admission. 2. Patient will identify barriers that currently interfere with their wellness or overcoming obstacles.  3. Patient will identify feelings, thought process and behaviors related to these barriers. 4. Patient will identify two changes they are willing to make to overcome these obstacles:    Summary of Patient Progress      Therapeutic Modalities:   Cognitive Behavioral Therapy Solution Focused Therapy Motivational Interviewing Relapse Prevention Therapy  Greg Jasaiah Karwowski, LCSW 
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
## 2016-11-17 NOTE — Progress Notes (Signed)
Recreation Therapy Notes  Date: 07.09.18 Time: 9:30 am Location: Craft Room  Group Topic: Self-expression  Goal Area(s) Addresses:  Patient will identify one color per emotion listed on wheel. Patient will verbalize benefit of using art as a means of self-expression. Patient will verbalize one emotion experienced during session.  Patient will be educated on other forms of self-expression.  Behavioral Response: Did not attend  Intervention: Emotion Wheel  Activity: Patients were given an Emotion Wheel worksheet and were instructed to pick a color for each emotion listed on the wheel and color the section in the color they chose.  Education: LRT educated patients on other forms of self-expression.  Education Outcome: Patient did not attend group.  Clinical Observations/Feedback: Patient did not attend group.   [name],Harbour Nordmeyer M, LRT/CTRS 11/17/2016 10:00 AM

## 2016-11-17 NOTE — Progress Notes (Signed)
11/17/16.  CS[facility].  Pt remains on the waitlist.  "About midway." Garner NashGregory Ygnacio Fecteau, MSW, LCSW Clinical Social Worker 11/17/2016 2:50 PM

## 2016-11-18 NOTE — [facility] Group Notes (Signed)
[facility] LCSW Group Therapy Note  Date/Time:11/18/2016, 3pm  Type of Therapy/Topic:  Group Therapy:  Feelings about Diagnosis  Participation Level:  Did Not Attend     Glennon MacSara P Mattye Verdone, LCSW 11/18/2016, 4:38 PM

## 2016-11-18 NOTE — Progress Notes (Signed)
Pt in his room most of the shift except for snack.  Pleasant upon approach.  Reports he didn't want HS benadryl because it is "just for side effects and I don't have any."  Still responds to internal stimuli. Monitored on 15 minute safety checks and maintained safety.

## 2016-11-18 NOTE — [facility] Group Notes (Signed)
[facility] Group Notes:  (Nursing/MHT/Case Management/Adjunct)  Date:  11/18/2016  Time:  11:09 PM  Type of Therapy:  Group Therapy  Participation Level:  Did Not Attend  Participation QualitySummary of Progress/Problems:  Mayra NeerJackie L Ferris Tally 11/18/2016, 11:09 PM

## 2016-11-18 NOTE — [facility] Group Notes (Signed)
[facility] Group Notes:  (Nursing/MHT/Case Management/Adjunct)  Date:  11/18/2016  Time:  1:56 PM  Type of Therapy:  Psychoeducational Skills  Participation Level:  Did Not Attend  Kenneth [name] 11/18/2016, 1:56 PM 

## 2016-11-18 NOTE — Progress Notes (Signed)
Pt appeared to sleep about 8.5 hours while monitored on 15 minute safety checks. 

## 2016-11-18 NOTE — Plan of Care (Signed)
Problem: Coping: Goal: Ability to verbalize frustrations and anger appropriately will improve Patient states about taking medications; "I'm not taking them; I don't need them."  Outcome: Progressing More verbal  Problem: Safety: Goal: Ability to demonstrate self-control will improve Outcome: Progressing Improved self control displayed  Problem: Education: Goal: Ability to verbalize precipitating factors for violent behavior will improve Outcome: Not Progressing Not able to verbalize this

## 2016-11-18 NOTE — Plan of Care (Signed)
Problem: Safety: Goal: Ability to demonstrate self-control will improve Outcome: Progressing Patient demonstrates self control during periods of frustrations.  Problem: Safety: Goal: Ability to demonstrate self-control will improve Outcome: Progressing Patient able to control anger. Goal: Ability to redirect hostility and anger into socially appropriate behaviors will improve Outcome: Progressing No outbursts in a social setting noted at this time on the unit.

## 2016-11-18 NOTE — Progress Notes (Signed)
Patient is alert and oriented to person and place. Skin is warm, dry and intact. No limitations to all four extremities noted. Patient wth sad affect today states, "I'm just tired today."  Currently denies SI at this time but continues to respond to internal stimuli.  Patient was observed ambulating in hall during the shift with a steady gait. Attends meals and group with selective peer interaction noted. Milieu remains therapeutic. Patient will be monitored and physician notified of any acute changes.

## 2016-11-18 NOTE — [facility] Group Notes (Deleted)
[facility] LCSW Group Therapy Note  Date/Time  Type of Therapy/Topic:  Group Therapy:  Feelings about Diagnosis  Participation Level:  None   Mood: would not say how she was feeling today   Description of Group:    This group will allow patients to explore their thoughts and feelings about diagnoses they have received. Patients will be guided to explore their level of understanding and acceptance of these diagnoses. Facilitator will encourage patients to process their thoughts and feelings about the reactions of others to their diagnosis, and will guide patients in identifying ways to discuss their diagnosis with significant others in their lives. This group will be process-oriented, with patients participating in exploration of their own experiences as well as giving and receiving support and challenge from other group members.   Therapeutic Goals: 1. Patient will demonstrate understanding of diagnosis as evidence by identifying two or more symptoms of the disorder:  2. Patient will be able to express two feelings regarding the diagnosis 3. Patient will demonstrate ability to communicate their needs through discussion and/or role plays  Summary of Patient Progress:   Pt left group early.  While present responding to internal stimuli, talking out loud about hotdogs and hamburgers.     Therapeutic Modalities:   Cognitive Behavioral Therapy Brief Therapy Feelings Identification     Glennon MacSara P Jalesa Thien, LCSW 11/18/2016, 4:42 PM
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
## 2016-11-18 NOTE — Progress Notes (Signed)
Recreation Therapy Notes  Date: 07.10.18 Time: 9:30 am Location: Craft Room  Group Topic: Goal Setting  Goal Area(s) Addresses:  Patient will write at least one goal. Patient will write at least one obstacle.  Behavioral Response: Did not attend  Intervention: Recovery Goal Chart  Activity: Patients were instructed to make a Recovery Goal Chart including their goals, obstacles, the date they started working on their goals, and the date they achieved their goals.  Education: LRT educated patients on healthy ways to celebrate reaching their goals.  Education Outcome: Patient did not attend group.  Clinical Observations/Feedback: Patient did not attend group.  Suad Autrey M, LRT/CTRS 11/18/2016 10:07 AM 
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
## 2016-11-18 NOTE — Progress Notes (Signed)
Patient ID: Kenneth [name], male   DOB: 05/19/1965, 51 y.o.   MRN: [id number] CSW, RN Care Manager Kenneth [name], Dr. Demetrius CharityP, Kenneth [name], Kenneth [name], and Kenneth SquibbGreg Wierda, LCSW all present for length of stay today.  It was determined that due to Pt history it would be unsafe to discharge patient to a group home or family care home setting.  ACTT team continues to [facility]. CSW will not continue to call for group homes at this time.  Kenneth SharkSara Patric Vanpelt, LCSW
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
## 2016-11-19 NOTE — Plan of Care (Signed)
Problem: Safety: Goal: Ability to demonstrate self-control will improve Outcome: Progressing Pt remains calm and cooperative. Able to control his behavior.

## 2016-11-19 NOTE — Progress Notes (Addendum)
Patient ID: Kenneth [name], male   DOB: [birth year], 51 y.o.   MRN: [id number]   CSW contacted [facility] care coordinator, Weber CooksGreg Pickett [phone] to discuss patient's case and discuss options for discharge plan. No answer, CSW left voicemail asking for a returned call. CSW will attempt to contact again later.  CSW also contacted [facility] outpatient care coordinator who is also involved with patient's case to discuss options for discharge plan. No answer, CSW left voicemail asking for a returned call. CSW will attempt to contact again later.   Mick Tanguma G. Garnette CzechSampson MSW, LCSWA 11/19/2016 1:18 PM

## 2016-11-19 NOTE — Plan of Care (Signed)
Problem: Health Behavior/Discharge Planning: Goal: Ability to implement measures to prevent violent behavior in the future will improve Outcome: Progressing No aggressive behavior displayed

## 2016-11-19 NOTE — Progress Notes (Signed)
Recreation Therapy Notes  Date: 07.11.18 Time: 9:30 am Location: Craft Room  Group Topic: Self-esteem  Goal Area(s) Addresses:  Patient will write at least one positive trait about self. Patient will verbalize benefit of having a healthy self-esteem.  Behavioral Response: Did not attend  Intervention: I Am  Activity: Patients were given a worksheet with the letter I on it and were instructed to write as many positive traits about themselves inside the letter.  Education: LRT educated patients on ways to increase their self-esteem.  Education Outcome: Patient did not attend group.  Clinical Observations/Feedback: Patient did not attend group.  [name],Stephane Junkins M, LRT/CTRS 11/19/2016 10:02 AM
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
## 2016-11-19 NOTE — Plan of Care (Signed)
Problem: Coping: Goal: Ability to verbalize frustrations and anger appropriately will improve Patient states about taking medications; "I'm not taking them; I don't need them."  Outcome: Progressing Pt able to verbally express his needs and feelings

## 2016-11-19 NOTE — [facility] Group Notes (Signed)
[facility] LCSW Group Therapy   11/19/2016  1:00 pm   Type of Therapy: Group Therapy   Participation Level: Patient invited but did not attend.    Latice Waitman, MSW, LCSWA 11/19/2016, 2:13PM 

## 2016-11-19 NOTE — Plan of Care (Signed)
Problem: Safety: Goal: Ability to remain free from injury will improve Outcome: Progressing Pt remains free from falls. Safety precautions maintained.   

## 2016-11-19 NOTE — Progress Notes (Signed)
CSW received message from Dr Sharol HarnessSimmons of [facility] ACT team on 11/10/16 while CSW was on vacation.  Message said that Dr Sharol HarnessSimmons had spoken to Red BudJenny in [facility] and that Boneta LucksJenny reports that [facility] is in agreement that he is appropriate and are not ignoring his referral. Garner NashGregory Meara Wiechman, MSW, LCSW Clinical Social Worker 11/19/2016 8:51 AM

## 2016-11-19 NOTE — Plan of Care (Signed)
Problem: Safety: Goal: Ability to remain free from injury will improve Outcome: Progressing Pt remains safe while in hospital    

## 2016-11-19 NOTE — Progress Notes (Signed)
Pt continues to be seen in room responding to voices and internal stimuli. He is compliant with all medications. He does not display any aggressive or hostile behaviors. He is in a pleasant mood today. Will continue to monitor.

## 2016-11-19 NOTE — Progress Notes (Signed)
Patient stayed in the milieu, alert and oriented but responding to internal stimuli at times. Pt able to program in the milieu, attending unit activities. Denying thoughts of self harm. Refused his bedtime medications (Benadryl) an stated "I don't need it, I don't have side effects anyway. So why should I take it ? I know when to take it". Pt currently in bed resting. No sign of discomfort noted. Was encouraged to talk to staff as needed. Safety precautions maintained.

## 2016-11-19 NOTE — Progress Notes (Signed)
D: Pt denies SI/HI/AVH. Pt is pleasant and cooperative, affect is flat and sad, he appears withdrawn not interacting with peers and staff. Patient  appears less anxious and she is interacting with peers and staff appropriately.  A: Pt was offered support and encouragement. Pt was offered scheduled medications. Pt was encouraged to attend groups. Q 15 minute checks were done for safety.  R:Pt did nor attends groups  Pt refused evening medication. Pt has no complaints.Pt receptive to treatment and safety maintained on unit.
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
## 2016-11-20 NOTE — Plan of Care (Signed)
Problem: Coping: Goal: Ability to verbalize frustrations and anger appropriately will improve Outcome: Progressing Pt discussing with nurse calmly does is frustrated due to going to group home. Reports he will go but does not want to. Pt spoke calm and softly
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
## 2016-11-20 NOTE — Progress Notes (Signed)
Recreation Therapy Notes  Date: 07.12.18 Time: 9:30 am Location: Craft Room  Group Topic: Leisure Education  Goal Area(s) Addresses:  Patient will identify things they are grateful for. Patient will identify how being grateful can influence decision making.  Behavioral Response: Did not attend  Intervention: Grateful Wheel  Activity: Patients were given an I Am Grateful For worksheet and were instructed to write things they are grateful for under each category.  Education: LRT educated patients on leisure.  Education Outcome: Patient did not attend group.   Clinical Observations/Feedback: Patient did not attend group.  [name],Blanton Kardell M, LRT/CTRS 11/20/2016 10:06 AM

## 2016-11-20 NOTE — Progress Notes (Signed)
Pt denies SI, HI, AVH. Medication compliant. Appropriate with staff and peers. Pt happy today. Reports its his birthday. Pt states he does not want to go to group home.  Encouragement and support offered. Safety checks maintained. Pt receptive and remains safe on unit with q 15 min checks

## 2016-11-20 NOTE — [facility] Group Notes (Signed)
[facility] LCSW Group Therapy Note  Date/Time: 11/20/16, 1300  Type of Therapy/Topic:  Group Therapy:  Balance in Life  Participation Level:  Pt did not attend group  Description of Group:    This group will address the concept of balance and how it feels and looks when one is unbalanced. Patients will be encouraged to process areas in their lives that are out of balance, and identify reasons for remaining unbalanced. Facilitators will guide patients utilizing problem- solving interventions to address and correct the stressor making their life unbalanced. Understanding and applying boundaries will be explored and addressed for obtaining  and maintaining a balanced life. Patients will be encouraged to explore ways to assertively make their unbalanced needs known to significant others in their lives, using other group members and facilitator for support and feedback.  Therapeutic Goals: 1. Patient will identify two or more emotions or situations they have that consume much of in their lives. 2. Patient will identify signs/triggers that life has become out of balance:  3. Patient will identify two ways to set boundaries in order to achieve balance in their lives:  4. Patient will demonstrate ability to communicate their needs through discussion and/or role plays  Summary of Patient Progress:          Therapeutic Modalities:   Cognitive Behavioral Therapy Solution-Focused Therapy Assertiveness Training  Greg Zayveon Raschke, LCSW 

## 2016-11-21 NOTE — Plan of Care (Signed)
Problem: Coping: Goal: Ability to verbalize frustrations and anger appropriately will improve Outcome: Progressing Pleasant and cooperative.  No loud outburst.

## 2016-11-21 NOTE — [facility] Group Notes (Signed)
[facility] LCSW Group Therapy Note  Date/Time: 11/21/16, 1300  Type of Therapy and Topic:  Group Therapy:  Feelings around Relapse and Recovery  Participation Level:  Did Not Attend   Mood:  Description of Group:    Patients in this group will discuss emotions they experience before and after a relapse. They will process how experiencing these feelings, or avoidance of experiencing them, relates to having a relapse. Facilitator will guide patients to explore emotions they have related to recovery. Patients will be encouraged to process which emotions are more powerful. They will be guided to discuss the emotional reaction significant others in their lives may have to patients' relapse or recovery. Patients will be assisted in exploring ways to respond to the emotions of others without this contributing to a relapse.  Therapeutic Goals: 1. Patient will identify two or more emotions that lead to relapse for them:  2. Patient will identify two emotions that result when they relapse:  3. Patient will identify two emotions related to recovery:  4. Patient will demonstrate ability to communicate their needs through discussion and/or role plays.   Summary of Patient Progress:     Therapeutic Modalities:   Cognitive Behavioral Therapy Solution-Focused Therapy Assertiveness Training Relapse Prevention Therapy  Greg Lailynn Southgate, LCSW       

## 2016-11-21 NOTE — [facility] Group Notes (Signed)
[facility] Group Notes:  (Nursing/MHT/Case Management/Adjunct)  Date:  11/21/2016  Time:  11:28 PM  Type of Therapy:  Group Therapy  Participation Level:  Did Not Attend Summary of Progress/Problems:  Mayra NeerJackie L Shannin Naab 11/21/2016, 11:28 PM

## 2016-11-21 NOTE — Progress Notes (Signed)
Pleasant and cooperative.  Denies SI/HI/AVH.  Visible in the milieu although minimal interaction noted with peers.  No group attendance.  Medication compliant.  Support offered.  Safety maintained.
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
## 2016-11-21 NOTE — Progress Notes (Signed)
Recreation Therapy Notes  Date: 07.13.18 Time: 10:00 am Location: Craft Room  Group Topic: Coping Skills  Goal Area(s) Addresses:  Patient will verbalize one emotion experienced in group. Patient will verbalize benefit of using art as a coping skill.  Behavioral Response: Did not attend   Intervention: Coloring  Activity: Patients were given coloring sheets to color and were instructed to think about the emotions they were feeling and what their minds were focused on.  Education: LRT educated patients on healthy coping skills.  Education Outcome: Patient did not attend group.   Clinical Observations/Feedback: Patient did not attend group.  Dacotah Cabello M, LRT/CTRS 11/21/2016 10:28 AM 

## 2016-11-22 NOTE — [facility] Group Notes (Signed)
[facility] LCSW Group Therapy  11/22/2016 2:38 PM  Type of Therapy:  Group Therapy  Participation Level:  Patient did not attend group. CSW invited patient to group.   Summary of Progress/Problems: Coping Skills: Patients defined and discussed healthy coping skills. Patients identified healthy coping skills they would like to try during hospitalization and after discharge. CSW offered insight to varying coping skills that may have been new to patients such as practicing mindfulness.  Oval Cavazos G. Garnette CzechSampson MSW, LCSWA 11/22/2016, 2:38 PM

## 2016-11-22 NOTE — Plan of Care (Signed)
Problem: Coping: Goal: Ability to verbalize frustrations and anger appropriately will improve Patient states about taking medications; "I'm not taking them; I don't need them."  Outcome: Progressing Patient approached writer in the beginning of this shift and requested a gatorade.  He denied needing any other help.  He returned to his room and was overheard talking to unseen other at times.  He is not as loud as in the past several weeks but continues to talk when alone. He is pleasant on contact but keeps responses to a minimum. He joined the group in the dayroom for snack.

## 2016-11-22 NOTE — Plan of Care (Signed)
Problem: Education: Goal: Mental status will improve Outcome: Progressing Pt continues to talk when in room alone, appropriate otherwise. Fixated on discharge plans.

## 2016-11-22 NOTE — Progress Notes (Signed)
Continues to isolate to room most of the day. Minimal interaction observed with peers. Medication compliant, but voices he would not take blood pressure medication if prescribed as pt's blood pressure has been high. Pt sometimes refusing to allow vs check, refused manual bp check today. Denies SI/HI/AVH, pain. Continues to be heard talking in his room when alone. No behavioral issues observed/reported. Did not attend group. Pleasant on approach, but continuously requests/questions discharge. Support and encouragement provided. Medications administered as ordered with education. Safety maintained with every 15 minute checks. Will continue to monitor.

## 2016-11-22 NOTE — Progress Notes (Signed)
Pt refusing to allow this nurse to check his blood pressure manually as requested by Dr. Joseph ArtSubedi. Pt is known to refuse blood pressure checks, and reports "there is nothing wrong with my blood pressure and I sure ain't gonna take a pill for it." Attempted to educated pt on high blood pressure, but he is refusing to allow conversation about high blood pressure. Safety maintained. Will continue to monitor.

## 2016-11-22 NOTE — Plan of Care (Signed)
Problem: Safety: Goal: Ability to demonstrate self-control will improve Outcome: Progressing Pt with no behavioral issues this shift. Minimally but pleasantly interacts with staff/peers. Talks with self when alone in room, appropriate when out of room.
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
## 2016-11-23 NOTE — Progress Notes (Signed)
Pt isolative to room. Pt continues to be heard in room at times responding to internal stimuli in a quiet voice. He is compliant with his po Trilafon and Zyprexa. His mood is pleasant and he is cooperative with staff at this time. Will continue to monitor.

## 2016-11-23 NOTE — Plan of Care (Signed)
Problem: Coping: Goal: Ability to verbalize frustrations and anger appropriately will improve Patient states about taking medications; "I'm not taking them; I don't need them."  Outcome: Progressing Patient requested assistance with shaving and completed this prior to 2100.  He was appreciative of help.  He continues to be pleasant on contact but has minimal interactions only.  He joined the group for snack and was observed in the bed by 2145. He continues to respond to unseen others when alone in his room and is overheard speaking in a quieter tone of voice.
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
## 2016-11-23 NOTE — [facility] Group Notes (Signed)
[facility] Group Notes:  (Nursing/MHT/Case Management/Adjunct)  Date:  11/23/2016  Time:  4:57 AM  Type of Therapy:  Psychoeducational Skills  Participation Level:  Did Not Attend   Summary of Progress/Problems:  Kenneth MilroyLaquanda Y Nalini [name] 11/23/2016, 4:57 AM

## 2016-11-23 NOTE — Plan of Care (Signed)
Problem: Coping: Goal: Ability to verbalize frustrations and anger appropriately will improve Outcome: Progressing Patient was approached at initial shift rounds.  He was pleasant and had good eye contact. He requested a Gatorade discussed having a shave when staff is available.  He was given options about times.  He later refused when staff was available. He continues to respond to unseen others when alone in his room. Volume of his talking when alone has decreased.

## 2016-11-23 NOTE — [facility] Group Notes (Signed)
[facility] LCSW Group Therapy  11/23/2016 2:08 PM  Type of Therapy:  Group Therapy  Participation Level:  Patient did not attend group. CSW invited patient to group.   Summary of Progress/Problems: Stress management: Patients defined and discussed the topic of stress and the related symptoms and triggers for stress. Patients identified healthy coping skills they would like to try during hospitalization and after discharge to manage stress in a healthy way. CSW offered insight to varying stress management techniques.   Kenneth [name] G. Airam Runions MSW, LCSWA 11/23/2016, 2:08 PM  
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
## 2016-11-24 NOTE — [facility] Group Notes (Signed)
[facility] LCSW Group Therapy   11/24/2016 1pm Type of Therapy: Group Therapy   Participation Level: Patient invited but did not attend.   Hampton AbbotKadijah Osiel Stick, MSW, LCSW-A 11/24/2016, 1:42PM

## 2016-11-24 NOTE — Plan of Care (Signed)
Problem: Health Behavior/Discharge Planning: Goal: Ability to implement measures to prevent violent behavior in the future will improve Outcome: Not Progressing Pt has no insight into his reasons for admission. Blames others. Support and encouragement provided.

## 2016-11-24 NOTE — Progress Notes (Signed)
Pt continues to be focused on discharging "to my own home" and refuses anything more offered. Continues to isolate to room, holding loud conversations in his room alone. Minimal interaction with peers observed. Pleasant with this nurse. Cooperative with most situations. Refuses vital signs to be obtained. Pt is medication compliant. Support and encouragement provided. Medications administered as ordered, pt knowledgeable regarding medication regimen. Safety maintained with every 15 minute checks. Will continue to monitor.
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
## 2016-11-24 NOTE — Plan of Care (Signed)
Problem: Health Behavior/Discharge Planning: Goal: Identification of resources available to assist in meeting health care needs will improve Outcome: Not Progressing Plans for discharge discussed with patient, pt not open to day program as offered by SW. Pt uncooperative with discharge planning.

## 2016-11-25 NOTE — Plan of Care (Signed)
Problem: Safety: Goal: Ability to demonstrate self-control will improve Outcome: Progressing Patient continues to demonstrate self control on unit.

## 2016-11-25 NOTE — [facility] Group Notes (Signed)
[facility]  Date/Time: 11/25/2016, 3:00 PM  Type of Therapy/Topic:  Group Therapy:  Feelings about Diagnosis  Participation Level:  Did Not Attend     Hampton AbbotKadijah Kathelyn Gombos, MSW, LCSW-A 11/25/2016, 3:50PM
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
## 2016-11-25 NOTE — Plan of Care (Signed)
Problem: Safety: Goal: Ability to demonstrate self-control will improve Outcome: Progressing No behavioral issues observed. Minimal interaction with staff/peers.

## 2016-11-25 NOTE — [facility] Group Notes (Signed)
[facility] Group Notes:  (Nursing/MHT/Case Management/Adjunct)  Date:  11/25/2016  Time:  2:37 PM  Type of Therapy:  Psychoeducational Skills  Participation Level:  Did Not Attend  P  Kenneth [name] M Moses Ellison 11/25/2016, 2:37 PM

## 2016-11-25 NOTE — Plan of Care (Signed)
Problem: Safety: Goal: Ability to remain free from injury will improve Outcome: Progressing Free from injury, safety maintained with every 15 minute checks.   Problem: Coping: Goal: Ability to verbalize frustrations and anger appropriately will improve Patient states about taking medications; "I'm not taking them; I don't need them."  Outcome: Progressing Pt does verbalize his issues with discharge planning, but is appropriate with interaction. Voices desire to refuse some options provided.   Problem: Health Behavior/Discharge Planning: Goal: Ability to implement measures to prevent violent behavior in the future will improve Outcome: Not Progressing Pt has little insight into his problem/diagnosis. Always blames others. Support and encouragement provided.  Problem: Safety: Goal: Ability to demonstrate self-control will improve Outcome: Progressing No behavioral issues observed/reported this shift. Pt stays in his room with door closed when actively responding to internal stimuli, carrying on conversations with no one else present.  Goal: Ability to redirect hostility and anger into socially appropriate behaviors will improve Outcome: Progressing Pt has displayed no aggression/ behavioral issues while on the unit today. Mostly isolative to self.

## 2016-11-25 NOTE — Progress Notes (Signed)
D: Pt denies SI/HI/AVH. Pt is pleasant and cooperative, affect is bright o approach. Pt appears less anxious and he is interacting with peers and staff appropriately.  A: Pt was offered support and encouragement. Pt was given scheduled medications. Pt was encouraged to attend groups. Q 15 minute checks were done for safety.  R:Pt did not attend evening group. Pt is taking medication. Pt has no complaints.Pt receptive to treatment and safety maintained on unit.

## 2016-11-25 NOTE — Plan of Care (Signed)
Problem: Physical Regulation: Goal: Ability to maintain clinical measurements within normal limits will improve Outcome: Progressing Pt's blood pressure has been elevated when he allows staff to obtain VS. He is now refusing VS. Refuses eduction regarding high blood pressure.

## 2016-11-26 NOTE — Progress Notes (Signed)
Patient ID: Kenneth [name], male   DOB: April 20, 1966, 51 y.o.   MRN: [id number] CSW left message for Kenneth [name], [facility] coordinator.  Called to confirm he received Dr. Derwood KaplanH's letter, which CSW faxed 7/17, describing Pt's clinical presentation and need for additional funding and enhanced services.  CSW requested that [facility] with his supervisor and Wellsite geologistMedical director to decide in what ways they would be able to assist us in an appropriate discharge plan for this patient.  Requested that he let us know something by the end of the day.  CSW left return number and will follow up today.  Jake SharkSara Wandy Bossler, LCSW
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
## 2016-11-26 NOTE — Progress Notes (Signed)
Patient ID: Kenneth [name], male   DOB: 01-15-1966, 51 y.o.   MRN: [id number] Patient is socially self isolated to room, irritable, guarded, declining in compliance with treatments and medications; randomly out of his room and returned, noted to be pacing in room responding to I/S, sad/flat mood and affect, unable to engage; patient was doing better until last week; has been refusing VS every morning.

## 2016-11-26 NOTE — Progress Notes (Signed)
Patient noted to be ambulating in hall with steady gait. Continues to isolate self to room and responds to internal stimuli. Compliant with medications this shift, affect flat and sad. Milieu remains safe with q 15 minute safety checks.

## 2016-11-26 NOTE — Plan of Care (Signed)
Problem: Activity: Goal: Sleeping patterns will improve Outcome: Progressing Patient slept for Estimated Hours of 7; Precautionary checks every 15 minutes for safety maintained, room free of safety hazards, patient sustains no injury or falls during this shift.    

## 2016-11-26 NOTE — Plan of Care (Signed)
Problem: Education: Goal: Knowledge of [facility] Education information/materials will improve Outcome: Not Applicable Date Met: 75/91/63 [facility] [place] policy not introduced to patient.  Problem: Safety: Goal: Ability to remain free from injury will improve Outcome: Progressing Patient remains safe from injury on the unit.  Problem: Coping: Goal: Ability to verbalize frustrations and anger appropriately will improve Patient states about taking medications; "I'm not taking them; I don't need them."  Outcome: Progressing Patient verbalizes frustrations and anger to staff.  Problem: Safety: Goal: Ability to demonstrate self-control will improve Outcome: Progressing Patient able to demonstrate self-control  Goal: Ability to redirect hostility and anger into socially appropriate behaviors will improve Outcome: Progressing Patient able to redirect hostility and anger appropriately.

## 2016-11-26 NOTE — [facility] Group Notes (Signed)
[facility] LCSW Group Therapy   11/26/2016  1:00 pm   Type of Therapy: Group Therapy   Participation Level: Patient invited but did not attend.   Hampton AbbotKadijah Aastha Dayley, MSW, LCSW-A 11/26/2016, 1:40PM

## 2016-11-26 NOTE — [place] FL2 (Signed)
  [place] MEDICAID FL2 LEVEL OF CARE SCREENING TOOL     IDENTIFICATION  Patient Name: Kenneth [name] Birthdate: 1965-07-27 Sex: male Admission Date (Current Location): 10/12/2016  [place]:  Kenneth [name] [id number] [facility] Address[facility]  [facility], [address], [place], [place] [address]      Provider Number: [id number]  Attending Physician Name and Address:  Kenneth [name], Kenneth [name]  Relative Name and Phone Number:  Kenneth [name], mother, [phone]    Current Level of Care: Hospital Recommended Level of Care: Family Care Home Prior Approval Number:    Date Approved/Denied:   PASRR Number:    Discharge Plan: Other (Comment) (Family Care Home)    Current Diagnoses: Patient Active Problem List   Diagnosis Date Noted  . HTN (hypertension) 10/21/2016  . 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 Tobacco use disorder 02/05/2016  . Antisocial personality disorder 01/23/2016  . Noncompliance 01/23/2016    Orientation RESPIRATION BLADDER Height & Weight     Self, Time, Situation, Place  Normal Continent Weight: 246 lb (111.6 kg) Height:  6' (182.9 cm)  BEHAVIORAL SYMPTOMS/MOOD NEUROLOGICAL BOWEL NUTRITION STATUS  Dangerous to self, others or property  (na) Continent  (na)  AMBULATORY STATUS COMMUNICATION OF NEEDS Skin   Independent Verbally Normal                       Personal Care Assistance Level of Assistance              Functional Limitations Info   (na)          SPECIAL CARE FACTORS FREQUENCY   (na)                    Contractures Contractures Info: Not present    Additional Factors Info   (na)               Current Medications (11/26/2016):  This is the current hospital active medication list Current Facility-Administered Medications  Medication Dose Route Frequency Provider Last Rate Last Dose  . acetaminophen (TYLENOL) tablet 650 mg  650 mg Oral Q6H PRN Kenneth [name],  Jagannath, [name]      . alum & mag hydroxide-simeth (MAALOX/MYLANTA) 200-200-20 MG/5ML suspension 30 mL  30 mL Oral Q4H PRN Kenneth [name], Jagannath, [name]      . magnesium hydroxide (MILK OF MAGNESIA) suspension 30 mL  30 mL Oral Daily PRN Kenneth [name], Jagannath, [name]      . OLANZapine (ZYPREXA) injection 10 mg  10 mg Intramuscular Daily [name], Kenneth [name]   10 mg at 10/16/16 1411  . OLANZapine zydis (ZYPREXA) disintegrating tablet 30 mg  30 mg Oral Daily [name], Kenneth [name]   30 mg at 11/26/16 0751  . perphenazine (TRILAFON) tablet 16 mg  16 mg Oral TID Kenneth [name], Andrea, [name]   16 mg at 11/26/16 1146     Discharge Medications: Please see discharge summary for a list of discharge medications.  Relevant Imaging Results:  Relevant Lab Results:   Additional Information none  Kenneth [name], Kenneth Lapine Jon, LCSW

## 2016-11-27 MED ORDER — ATENOLOL 12.5 MG HALF TABLET
12.5000 mg | ORAL_TABLET | Freq: Every day | ORAL | Status: DC
  Administered 2016-11-27 – 2016-12-03 (×7): 12.5 mg via ORAL
  Filled 2016-11-27 (×7): qty 1

## 2016-11-27 NOTE — [facility] Group Notes (Signed)
[facility] LCSW Group Therapy Note  Date/Time: 11/27/16, 1300  Type of Therapy/Topic:  Group Therapy:  Balance in Life  Participation Level:  Did not attend  Description of Group:    This group will address the concept of balance and how it feels and looks when one is unbalanced. Patients will be encouraged to process areas in their lives that are out of balance, and identify reasons for remaining unbalanced. Facilitators will guide patients utilizing problem- solving interventions to address and correct the stressor making their life unbalanced. Understanding and applying boundaries will be explored and addressed for obtaining  and maintaining a balanced life. Patients will be encouraged to explore ways to assertively make their unbalanced needs known to significant others in their lives, using other group members and facilitator for support and feedback.  Therapeutic Goals: 1. Patient will identify two or more emotions or situations they have that consume much of in their lives. 2. Patient will identify signs/triggers that life has become out of balance:  3. Patient will identify two ways to set boundaries in order to achieve balance in their lives:  4. Patient will demonstrate ability to communicate their needs through discussion and/or role plays  Summary of Patient Progress:          Therapeutic Modalities:   Cognitive Behavioral Therapy Solution-Focused Therapy Assertiveness Training  Greg Harshil Cavallaro, LCSW 

## 2016-11-27 NOTE — Plan of Care (Signed)
Problem: Coping: Goal: Ability to verbalize frustrations and anger appropriately will improve Outcome: Progressing Patient verbalized feelings to staff.    
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
## 2016-11-27 NOTE — Progress Notes (Signed)
D: Pt denies SI/HI/AVH. Pt is pleasant and cooperative, affect is flat but brightens upon approach. Pt  appears less anxious and he is interacting with peers and staff appropriately.  A: Pt was offered support and encouragement. Pt was encouraged to attend groups. Q 15 minute checks were done for safety.  R:Pt did not attend evening group. Pt has no complaints.Pt receptive to treatment and safety maintained on unit.

## 2016-11-27 NOTE — Progress Notes (Signed)
Pt denies current SI, HI, a/v hallucinations. Pt seen in room responding  to internal stimuli. Pt is calm and cooperative. No outburst of anger noted. Will continue to monitor for safety.
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
## 2016-11-28 NOTE — Progress Notes (Signed)
Pleasant and cooperative.  Denies SI/HI/AVH.  Speech is logical and coherent.  Continues to stay to self.  Medication compliant.  Support offered.  Safety maintained.

## 2016-11-28 NOTE — Plan of Care (Signed)
Problem: Activity: Goal: Interest or engagement in activities will improve Outcome: Progressing Goes outside during outside time and visible in dayroom different times throughout the day.
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
## 2016-11-28 NOTE — [facility] Group Notes (Signed)
[facility] LCSW Group Therapy  11/28/2016 1:35 PM  Type of Therapy:  Group Therapy  Participation Level:  Did not attend   Modes of Intervention:  Activity, Discussion, Education, Socialization and Support  Summary of Progress/Problems: Feelings around Relapse. Group members discussed the meaning of relapse and shared personal stories of relapse, how it affected them and others, and how they perceived themselves during this time. Group members were encouraged to identify triggers, warning signs and coping skills used when facing the possibility of relapse. Social supports were discussed and explored in detail.   Garnell Begeman L Calven Gilkes MSW, LCSW  11/28/2016, 1:35 PM      

## 2016-11-29 NOTE — Progress Notes (Signed)
D: Pt denies SI/HI/AVH. Pt has minimal interaction with peers/ staff. Pt forwards little information, but pt safe on the unit at this time.   A: Pt was offered support and encouragement.  Pt was encourage to attend groups. Q 15 minute checks were done for safety.   R: safety maintained on unit.
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
## 2016-11-29 NOTE — [facility] Group Notes (Signed)
[facility] Group Notes:  (Nursing/MHT/Case Management/Adjunct)  Date:  11/29/2016  Time:  6:18 AM  Type of Therapy:  Psychoeducational Skills  Participation Level:  Active  Participation Quality:  Appropriate, Attentive and Sharing  Affect:  Appropriate  Cognitive:  Appropriate  Insight:  Appropriate and Good  Engagement in Group:  Engaged  Modes of Intervention:  Discussion, Socialization and Support  Summary of Progress/Problems:  Chancy MilroyLaquanda Y Trenna Kiely 11/29/2016, 6:18 AM

## 2016-11-29 NOTE — Progress Notes (Signed)
Kenneth [name] remains cooperative with treatment. Kenneth [name] was medication compliant, conversation seemed to be logical with Clinical research associatewriter. No behavioral issues to report on shift at this time,

## 2016-11-29 NOTE — [facility] Group Notes (Signed)
[facility] LCSW Group Therapy  11/29/2016 2:20 PM  Type of Therapy:  Group Therapy  Participation Level:  Patient did not attend group. CSW invited patient to group.   Summary of Progress/Problems: Stress management: Patients defined and discussed the topic of stress and the related symptoms and triggers for stress. Patients identified healthy coping skills they would like to try during hospitalization and after discharge to manage stress in a healthy way. CSW offered insight to varying stress management techniques.   Yer Olivencia G. Garnette CzechSampson MSW, LCSWA 11/29/2016, 2:21 PM

## 2016-11-29 NOTE — Progress Notes (Signed)
Patient was visible on the unit for meals, snack, and medication, but otherwise patient stayed in room. Patient did not attend unit group/activities. Patient denies SI/HI/AH/VH. Patient is alert and oriented x 4, breathing unlabored, and extremities x 4 within normal limits. Patient is calm and cooperative. Patient did not display any disruptive behavior. Patient continues to be monitored on 15 minute safety checks. Will continue to monitor patient and notify MD of any changes.

## 2016-11-29 NOTE — Plan of Care (Signed)
Problem: Safety: Goal: Ability to remain free from injury will improve Outcome: Progressing Patient remains safe and free from injury during hospitalization and on Q 15 minute observation. Will continue to monitor.    

## 2016-11-30 NOTE — Progress Notes (Signed)
Denies SI/HI/AVH.  Medication compliant. No group attendance.    No inappropriate behavior noted.  Support offered.  Safety checks maintained.

## 2016-11-30 NOTE — Progress Notes (Signed)
Kenneth [name] remains cooperative with treatment. He was medication compliant, conversation seemed to be logical with Clinical research associatewriter. No behavioral issues to report on shift at this time.

## 2016-11-30 NOTE — [facility] Group Notes (Signed)
[facility] Group Notes:  (Nursing/MHT/Case Management/Adjunct)  Date:  11/30/2016  Time:  7:17 AM  Type of Therapy:  Psychoeducational Skills  Participation Level:  Did Not Attend   Summary of Progress/Problems:  Kenneth MilroyLaquanda Y Miyonna [name] 11/30/2016, 7:17 AM
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
## 2016-11-30 NOTE — Plan of Care (Signed)
Problem: Safety: Goal: Ability to demonstrate self-control will improve Outcome: Progressing Pleasant and cooperative.    

## 2016-11-30 NOTE — [facility] Group Notes (Signed)
[facility] Group Notes: (Clinical Social Work)   11/30/2016      Type of Therapy:  Group Therapy   Participation Level:  Did Not Attend despite MHT prompting   Ambrose MantleMareida Grossman-Orr, LCSW 11/30/2016, 2:36 PM

## 2016-12-01 NOTE — [facility] Group Notes (Signed)
[facility] LCSW Group Therapy Note  Date/Time: 12/01/16, 1300  Type of Therapy and Topic:  Group Therapy:  Overcoming Obstacles  Participation Level:  Did not attend  Description of Group:    In this group patients will be encouraged to explore what they see as obstacles to their own wellness and recovery. They will be guided to discuss their thoughts, feelings, and behaviors related to these obstacles. The group will process together ways to cope with barriers, with attention given to specific choices patients can make. Each patient will be challenged to identify changes they are motivated to make in order to overcome their obstacles. This group will be process-oriented, with patients participating in exploration of their own experiences as well as giving and receiving support and challenge from other group members.  Therapeutic Goals: 1. Patient will identify personal and current obstacles as they relate to admission. 2. Patient will identify barriers that currently interfere with their wellness or overcoming obstacles.  3. Patient will identify feelings, thought process and behaviors related to these barriers. 4. Patient will identify two changes they are willing to make to overcome these obstacles:    Summary of Patient Progress      Therapeutic Modalities:   Cognitive Behavioral Therapy Solution Focused Therapy Motivational Interviewing Relapse Prevention Therapy  Greg Jaxon Flatt, LCSW 

## 2016-12-01 NOTE — Progress Notes (Signed)
Patient ID: Kenneth [name], male   DOB: 11/21/1965, 51 y.o.   MRN: [id number] CSW called mulitple group homes in search of placement with the following results: [facility]: Left voicemail [facility]-No Beds at this time [facility]-Mailbox full [facility]-Staff will pass info on to Interior and spatial designerDirector, provided return [place] [facility] [facility] [place] [facility]-No male beds at this time [facility] [address] at this [facility]  CSW will continue to reach out to others.  Kenneth SharkSara Gertrude Tarbet, LCSW

## 2016-12-01 NOTE — Progress Notes (Signed)
Pt denies current SI, HI, a/v hallucinations. Pt calm and cooperative with staff. Pt is medication and meal compliant.No aggression or hostile behaviors noted. Will continue to monitor for safety.
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
## 2016-12-02 MED ORDER — PERPHENAZINE 16 MG PO TABS: 16.0000 mg | ORAL_TABLET | Freq: Three times a day (TID) | ORAL | 1 refills | Status: DC

## 2016-12-02 MED ORDER — OLANZAPINE 15 MG PO TBDP: 30.0000 mg | ORAL_TABLET | Freq: Every day | ORAL | 1 refills | Status: DC

## 2016-12-02 MED ORDER — ATENOLOL 25 MG PO TABS: 12.5000 mg | ORAL_TABLET | Freq: Every day | ORAL | 1 refills | Status: DC

## 2016-12-02 NOTE — Progress Notes (Signed)
Patient pleasant and cooperative with care. Denies SI, HI, AVH. Pt noted having quiet calm conversation in room. Pt smiling in no distress. Medication compliant. Appropriate with staff and peers.  Encouragement and support offered. Safety checks maintained. Medications given as prescribed. Pt remains safe on unit with q 15 min checks.

## 2016-12-02 NOTE — Progress Notes (Signed)
  [facility] :  Will you be returning to the same living situation after discharge:  Yes,   his apartment At discharge, do you have transportation home?: Yes,   ACTT team Do you have the ability to pay for your medications: Yes,     Release of information consent forms completed and in the chart;  Patient's signature needed at discharge.  Patient to Follow up at: Follow-up Information    Services, Psychotherapeutic. Schedule an appointment as soon as possible for a visit on 12/03/2016.   Why:  Staff will provide transporrtation home and resume ACTT services. Contact information: [address] [place] [address] [phone]           Next level of care provider has access to [facility] Link:no  Safety Planning and Suicide Prevention discussed: Yes,     Have you used any form of tobacco in the last 30 days? (Cigarettes, Smokeless Tobacco, Cigars, and/or Pipes): Yes  Has patient been referred to the Quitline?: Patient refused referral  Patient has been referred for addiction treatment: Yes  Glennon MacSara P Cheyrl Buley, LCSW 12/02/2016, 4:25 PM
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
## 2016-12-02 NOTE — Plan of Care (Signed)
[facility]  Reason for Crisis Plan:  Chronic Mental Illness/Medical Illness   Plan of Care:  Referral for Inpatient Hospitalization  Family Support:    None  Current Living Environment:  Living Arrangements: Alone in apartment  Insurance:   Hospital Account    Name Acct ID Class Status Primary Coverage   Kenneth [name], Kenneth [name] [id number] [facility] Open LME MEDICAID - [facility]        Guarantor Account (for Hospital Account [id number])    Name Relation to Pt Service Area Active? Acct Type   Kenneth [name], Kenneth [name] Self [facility] Yes Behavioral Health   Address Phone       [address] [place], [place] [address] [phone](H)          Coverage Information (for Hospital Account [id number])    F/O Payor/Plan Precert #   [facility]ICA[facility] [id number]   Subscriber Subscriber #   Kenneth [name], Kenneth [name] [id number] [facility]   Address Phone   [address] [place], [place] [address] [phone]      Legal Guardian:  Legal Guardian: Other: (N/A)  Primary Care Provider:  Patient, No Pcp Per  Current Outpatient Providers:  [facility] ACTT [place]  Psychiatrist:   Dr. Sharol HarnessSimmons  Counselor/Therapist:     Compliant with Medications:  No  Additional Information:Kenneth [name] has [facility] and other hospitals.  At discharge he is often non-compliant with prescribed medication regimen and thus decompensates.  It is suggested that Mr. Elsey be [facility] when he presents to the ED as this is believed to be a more appropriate option for him to gain long-term skills that will help him be more compliant with his treatment.  His ACTT team is established to support him outpatient would prefer an opportunity for PT to be treated on the [facility] transitions [facility]   [employer]Sara P Rio Taber, LCSW 7/24/20184:45 PM

## 2016-12-02 NOTE — BHH Suicide Risk Assessment (Signed)
B[facility] INPATIENT:  Family/Significant Other Suicide Prevention Education  Suicide Prevention Education:  Patient Refusal for Family/Significant Other Suicide Prevention Education: The patient Athena MasseBenjamin M Mansour has refused to provide written consent for family/significant other to be provided Family/Significant Other Suicide Prevention Education during admission and/or prior to discharge.  Physician notified.  Cleda DaubSara P Akirra Lacerda, LCSW 12/02/2016, 4:26 PM

## 2016-12-02 NOTE — BHH Group Notes (Signed)
BHH LCSW Group Therapy Note  Date/Time: 12/02/2016, 3:00PM  Type of Therapy/Topic:  Group Therapy:  Feelings about Diagnosis  Participation Level:  Did Not Attend    Description of Group:    This group will allow patients to explore their thoughts and feelings about diagnoses they have received. Patients will be guided to explore their level of understanding and acceptance of these diagnoses. Facilitator will encourage patients to process their thoughts and feelings about the reactions of others to their diagnosis, and will guide patients in identifying ways to discuss their diagnosis with significant others in their lives. This group will be process-oriented, with patients participating in exploration of their own experiences as well as giving and receiving support and challenge from other group members.   Therapeutic Goals: 1. Patient will demonstrate understanding of diagnosis as evidence by identifying two or more symptoms of the disorder:  2. Patient will be able to express two feelings regarding the diagnosis 3. Patient will demonstrate ability to communicate their needs through discussion and/or role plays      Therapeutic Modalities:   Cognitive Behavioral Therapy Brief Therapy Feelings Identification   Hampton AbbotKadijah Memorie Yokoyama, MSW, LCSW-A 12/02/2016, 3:46PM

## 2016-12-02 NOTE — Progress Notes (Signed)
Patient ID: Athena MasseBenjamin M Doig, male   DOB: 1965/09/04, 51 y.o.   MRN: [id number] CSW notified [facility] staff Lead Helmut Musterlicia that Pt would be discharged tomorrow, 7/25 and would need transportation home.  She agreed and asks to be called when everything is ready for him to be picked up.  Jake SharkSara Arney Mayabb, LCSW

## 2016-12-03 NOTE — BHH Suicide Risk Assessment (Addendum)
[facility]   Principal Problem: Undifferentiated schizophrenia [facility](HCC) Discharge Diagnoses:  Patient Active Problem List   Diagnosis Date Noted  . HTN (hypertension) [I10] 10/21/2016  . Undifferentiated schizophrenia (HCC) [F20.3] 10/12/2016  . Tobacco use disorder [F17.200] 02/05/2016  . Antisocial personality disorder [F60.2] 01/23/2016  . Noncompliance [Z91.19] 01/23/2016    Total Time spent with patient: 30 minutes  Musculoskeletal: Strength & Muscle Tone: within normal limits Gait & Station: normal Patient leans: N/A  Psychiatric Specialty Exam: Review of Systems  Psychiatric/Behavioral: Positive for hallucinations.  All other systems reviewed and are negative.   Blood pressure (!) 162/93, pulse 76, temperature 98.5 F (36.9 C), temperature source Oral, resp. rate 18, height 6' (1.829 m), weight 111.6 kg (246 lb), SpO2 97 %.Body mass index is 33.36 kg/m.  General Appearance: Casual  Eye Contact::  Good  Speech:  Clear and Coherent409  Volume:  Normal  Mood:  Euthymic  Affect:  Appropriate  Thought Process:  Goal Directed and Descriptions of Associations: Intact  Orientation:  Full (Time, Place, and Person)  Thought Content:  Delusions, Hallucinations: Auditory and Paranoid Ideation  Suicidal Thoughts:  No  Homicidal Thoughts:  No  Memory:  Immediate;   Fair Recent;   Fair Remote;   Fair  Judgement:  Impaired  Insight:  Shallow  Psychomotor Activity:  Normal  Concentration:  Fair  Recall:  [employer]iservFair  Fund of Knowledge:Fair  Language: Fair  Akathisia:  No  Handed:  Left  AIMS (if indicated):     Assets:  Communication Skills Desire for Improvement Financial Resources/Insurance Housing Physical Health Resilience  Sleep:  Number of Hours: 5.75  Cognition: WNL  ADL's:  Intact   Mental Status Per Nursing Assessment::   On Admission:     Demographic Factors:  Male, Caucasian and Living alone  Loss Factors: NA  Historical  Factors: Impulsivity  Risk Reduction Factors:   NA  Continued Clinical Symptoms:  Schizophrenia:   Paranoid or undifferentiated type  Cognitive Features That Contribute To Risk:  None    Suicide Risk:  Minimal: No identifiable suicidal ideation.  Patients presenting with no risk factors but with morbid ruminations; may be classified as minimal risk based on the severity of the depressive symptoms  Follow-up Information    Services, Psychotherapeutic. Schedule an appointment as soon as possible for a visit on 12/03/2016.   Why:  Staff will provide transporrtation home and resume ACTT services. Contact information: [address] [place] [address] [phone]           Plan Of Care/Follow-up recommendations:  Activity:  as tolerated. Diet:  low sodium heart healthy. Other:  keep follow up appointment.  Kristine LineaJolanta Brehanna Deveny, MD 12/03/2016, 9:52 AM

## 2016-12-03 NOTE — Progress Notes (Signed)
  [facility] :  Will you be returning to the same living situation after discharge:  Yes,  independent living At discharge, do you have transportation home?: Yes,  [facility]. Do you have the ability to pay for your medications: Yes,  Medicaid  Release of information consent forms completed and in the chart;  Patient's signature needed at discharge.  Patient to Follow up at: Follow-up Information    Services, Psychotherapeutic. Schedule an appointment as soon as possible for a visit on 12/03/2016.   Why:  Staff will provide transporrtation home and resume ACTT services. Contact information: [address] [place] [address] [phone]           Next level of care provider has access to [facility] Link:no  Safety Planning and Suicide Prevention discussed: Yes,  SPE completed with ACT Team.  Have you used any form of tobacco in the last 30 days? (Cigarettes, Smokeless Tobacco, Cigars, and/or Pipes): Yes  Has patient been referred to the Quitline?: Patient refused referral  Patient has been referred for addiction treatment: N/A  Lynden OxfordKadijah R Genisis Sonnier, MSW, LCSW-A 12/03/2016, 2:08 PM
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
## 2016-12-03 NOTE — Plan of Care (Signed)
Problem: Coping: Goal: Ability to verbalize frustrations and anger appropriately will improve Patient states about taking medications; "I'm not taking them; I don't need them."  Outcome: Progressing Improved frustration tolerance  Problem: Health Behavior/Discharge Planning: Goal: Ability to implement measures to prevent violent behavior in the future will improve Outcome: Progressing Improved self control

## 2016-12-03 NOTE — Progress Notes (Signed)
Pt visible and social with peers.  Discussed with this RN that he is being discharged tomorrow and reports he feels ready for this.  Pt was not heard self dialoging tonight.  Pt maintained safety while monitored on 15 minute safety checks.
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
## 2016-12-03 NOTE — Progress Notes (Signed)
Denies SI/HI/AVH.  Flat affect. Discharge instructions given, verbalized understanding.  Prescriptions given and personal belongings returned.  Escorted off unit by staff member to meet ACT to travel home.

## 2016-12-03 NOTE — [facility] Group Notes (Signed)
[facility] LCSW Group Therapy   12/03/2016  9:30 am   Type of Therapy: Group Therapy   Participation Level: Patient invited but did not attend.     Hampton AbbotKadijah Meldon Hanzlik, MSW, LCSW-A 12/03/2016, 3:17PM

## 2017-02-11 ENCOUNTER — Emergency Department
Admission: EM | Admit: 2017-02-11 | Discharge: 2017-02-11 | Disposition: A | Discharge status: Home / Self Care | Payer: Medicaid Other | Attending: Emergency Medicine | Admitting: Emergency Medicine

## 2017-02-11 ENCOUNTER — Inpatient Hospital Stay
Admission: AD | Admit: 2017-02-11 | Discharge: 2017-02-23 | DRG: 885 | Disposition: A | Discharge status: Home / Self Care | Payer: Medicaid Other | Attending: Psychiatry | Admitting: Psychiatry

## 2017-02-11 ENCOUNTER — Encounter: Payer: Self-pay | Admitting: Emergency Medicine

## 2017-02-11 DIAGNOSIS — Z888 Allergy status to other drugs, medicaments and biological substances status: Secondary | ICD-10-CM

## 2017-02-11 DIAGNOSIS — Z79899 Other long term (current) drug therapy: Secondary | ICD-10-CM | POA: Insufficient documentation

## 2017-02-11 DIAGNOSIS — Z91199 Patient's noncompliance with other medical treatment and regimen due to unspecified reason: Secondary | ICD-10-CM

## 2017-02-11 DIAGNOSIS — E7800 Pure hypercholesterolemia, unspecified: Secondary | ICD-10-CM | POA: Diagnosis present

## 2017-02-11 DIAGNOSIS — Z9114 Patient's other noncompliance with medication regimen: Secondary | ICD-10-CM

## 2017-02-11 DIAGNOSIS — I10 Essential (primary) hypertension: Secondary | ICD-10-CM | POA: Diagnosis present

## 2017-02-11 DIAGNOSIS — F17200 Nicotine dependence, unspecified, uncomplicated: Secondary | ICD-10-CM | POA: Diagnosis present

## 2017-02-11 DIAGNOSIS — F250 Schizoaffective disorder, bipolar type: Secondary | ICD-10-CM | POA: Diagnosis present

## 2017-02-11 DIAGNOSIS — F203 Undifferentiated schizophrenia: Secondary | ICD-10-CM | POA: Diagnosis present

## 2017-02-11 DIAGNOSIS — F209 Schizophrenia, unspecified: Secondary | ICD-10-CM | POA: Insufficient documentation

## 2017-02-11 DIAGNOSIS — F17210 Nicotine dependence, cigarettes, uncomplicated: Secondary | ICD-10-CM | POA: Diagnosis present

## 2017-02-11 DIAGNOSIS — Z9119 Patient's noncompliance with other medical treatment and regimen: Secondary | ICD-10-CM

## 2017-02-11 DIAGNOSIS — R440 Auditory hallucinations: Secondary | ICD-10-CM

## 2017-02-11 DIAGNOSIS — Z880 Allergy status to penicillin: Secondary | ICD-10-CM | POA: Diagnosis not present

## 2017-02-11 LAB — COMPREHENSIVE METABOLIC PANEL
ALBUMIN: 4.4 g/dL (ref 3.5–5.0)
ALK PHOS: 115 U/L (ref 38–126)
ALT: 25 U/L (ref 17–63)
ANION GAP: 11 (ref 5–15)
AST: 25 U/L (ref 15–41)
BILIRUBIN TOTAL: 0.8 mg/dL (ref 0.3–1.2)
BUN: 6 mg/dL (ref 6–20)
CO2: 24 mmol/L (ref 22–32)
Calcium: 9 mg/dL (ref 8.9–10.3)
Chloride: 97 mmol/L — ABNORMAL LOW (ref 101–111)
Creatinine, Ser: 0.95 mg/dL (ref 0.61–1.24)
GFR calc Af Amer: >60 mL/min (ref >60)
GFR calc non Af Amer: >60 mL/min (ref >60)
GLUCOSE: 113 mg/dL — AB (ref 65–99)
POTASSIUM: 3.6 mmol/L (ref 3.5–5.1)
SODIUM: 132 mmol/L — AB (ref 135–145)
TOTAL PROTEIN: 7.8 g/dL (ref 6.5–8.1)

## 2017-02-11 LAB — CBC
HCT: 46.7 % (ref 40.0–52.0)
HEMOGLOBIN: 16.2 g/dL (ref 13.0–18.0)
MCH: 31.2 pg (ref 26.0–34.0)
MCHC: 34.7 g/dL (ref 32.0–36.0)
MCV: 89.9 fL (ref 80.0–100.0)
Platelets: 328 10*3/uL (ref 150–440)
RBC: 5.19 MIL/uL (ref 4.40–5.90)
RDW: 14.5 % (ref 11.5–14.5)
WBC: 7.8 10*3/uL (ref 3.8–10.6)

## 2017-02-11 LAB — ETHANOL: Alcohol, Ethyl (B): <10 mg/dL (ref <10)

## 2017-02-11 MED ORDER — ATENOLOL 25 MG PO TABS
12.5000 mg | ORAL_TABLET | Freq: Every day | ORAL | Status: DC
Start: 2017-02-11 — End: 2017-02-11
  Administered 2017-02-11: 12.5 mg via ORAL
  Filled 2017-02-11: qty 1

## 2017-02-11 MED ORDER — OLANZAPINE 10 MG PO TBDP
30.0000 mg | ORAL_TABLET | Freq: Every day | ORAL | Status: DC
  Filled 2017-02-11 (×2): qty 3

## 2017-02-11 MED ORDER — PERPHENAZINE 4 MG PO TABS
16.0000 mg | ORAL_TABLET | Freq: Three times a day (TID) | ORAL | Status: DC
  Administered 2017-02-11: 16 mg via ORAL
  Filled 2017-02-11: qty 1
  Filled 2017-02-11 (×2): qty 4
  Filled 2017-02-11: qty 1

## 2017-02-11 MED ORDER — ATENOLOL 25 MG PO TABS: 12.5000 mg | ORAL_TABLET | Freq: Every day | ORAL | Status: DC

## 2017-02-11 NOTE — ED Notes (Signed)
Patient refusing to transfer to BMU. Patient states, "I don't want to go to that motherfucker."  Patient also refusing PM medications stating, "I already took my medications."

## 2017-02-11 NOTE — ED Triage Notes (Signed)
Pt arrived via [facility] under IVC. Pt is diagnosed with schizophrenia and anti social personality. Pt is reported to not be taking medications. Pt is reported delusional, believing that he owns [employer]. Pt presents with poor hygiene. Pt calm and cooperative in triage.

## 2017-02-11 NOTE — ED Notes (Signed)
Pt changed into wine colored scrubs by this Clinical research associate and Mellody Dance, [facility].

## 2017-02-11 NOTE — BH Assessment (Signed)
Assessment Note  Kenneth [name] is an 51 y.o. male. Information gathering limited by pt presentation. Pt's chart reviewed and interview attempted. Pt presents involuntarily, referred by ACT team, for assessment. Pt has h/o schizophrenia and is not currently compliant with medications. Pt presents as psychotic with disorganized thought content. Insight is poor and memory is impaired. Pt states "I am getting evil" and is observed with increasing muscle tension. Per report, pt believes he is the devil. Pt expresses displeasure with inpatient disposition and states to this clinician "I'm not threatening you but if he [Dr.Clapacs] keeps me you will no longer exist. And I've done that before".    Diagnosis: Schizophrenia  Past Medical History:  Past Medical History:  Diagnosis Date  . Anxiety   . Hypercholesteremia   . Hypertension   . Schizophrenia [facility])     Past Surgical History:  Procedure Laterality Date  . CIRCUMCISION, NON-NEWBORN      Family History: No family history on file.  Social History:  reports that he has been smoking Cigarettes.  He has been smoking about 2.00 packs per day. He has never used smokeless tobacco. He reports that he drinks alcohol. He reports that he does not use drugs.  Additional Social History:  Alcohol / Drug Use Pain Medications: UTA Prescriptions: UTA Over the Counter: UTA History of alcohol / drug use?: No history of alcohol / drug abuse (Per Chart)  CIWA: CIWA-Ar BP: (!) 184/100 Pulse Rate: (!) 106 COWS:    Allergies:  Allergies  Allergen Reactions  . Haldol [Haloperidol] Other (See Comments)    "lock up"  . Loxapine     "Locks ups"  . Penicillins Other (See Comments)    Neck swells Has patient had a PCN reaction causing immediate rash, facial/tongue/throat swelling, SOB or lightheadedness with hypotension: yes Has patient had a PCN reaction causing severe rash involving mucus membranes or skin necrosis: no Has patient had a PCN  reaction that required hospitalization:  no Has patient had a PCN reaction occurring within the last 10 years: no If all of the above answers are "NO", then may proceed with Cephalosporin use.     Home Medications:  (Not in a hospital admission)  OB/GYN Status:  No LMP for male patient.  General Assessment Data Assessment unable to be completed: Yes Reason for not completing assessment: Pt presentation/mental status Location of Assessment: [facility] ED TTS Assessment: In system Is this a Tele or Face-to-Face Assessment?: Face-to-Face Is this an Initial Assessment or a Re-assessment for this encounter?: Initial Assessment Marital status:  (UTA) Is patient pregnant?: No Pregnancy Status: No Living Arrangements: Alone (Per Chart) Can pt return to current living arrangement?:  (UTA) Admission Status: Involuntary Is patient capable of signing voluntary admission?: No Referral Source: Other (ACT Team) Insurance type: [employer] 3[employer]     Crisis Care Plan Living Arrangements: Alone (Per Chart) Name of Psychiatrist: Act team Name of Therapist: Act team  Education Status Is patient currently in school?: No Highest grade of school patient has completed: UTA  Risk to self with the past 6 months Suicidal Ideation:  (UTA) Has patient been a risk to self within the past 6 months prior to admission? :  (UTA) Suicidal Intent:  (UTA) Has patient had any suicidal intent within the past 6 months prior to admission? : Other (comment) Is patient at risk for suicide?:  (UTA) Suicidal Plan?:  (UTA) Has patient had any suicidal plan within the past 6 months prior to admission? :  (  UTA) Access to Means:  (UTA) What has been your use of drugs/alcohol within the last 12 months?: UTA Previous Attempts/Gestures:  (UTA) Other Self Harm Risks: non-compliance w/medication Intentional Self Injurious Behavior:  (UTA) Family Suicide History: Unable to assess Recent stressful life event(s):   (UTA) Persecutory voices/beliefs?: Yes Depression:  (UTA) Depression Symptoms:  (UTA) Substance abuse history and/or treatment for substance abuse?:  (UTA) Suicide prevention information given to non-admitted patients: Not applicable  Risk to Others within the past 6 months Homicidal Ideation:  (UTA.) Does patient have any lifetime risk of violence toward others beyond the six months prior to admission? : Yes (comment) (h/o aggression per report) Thoughts of Harm to Others:  (UTA) Current Homicidal Intent:  (UTA) Current Homicidal Plan:  (UTA) Access to Homicidal Means:  (UTA) Identified Victim:  (UTA) History of harm to others?:  (UTA) Assessment of Violence: None Noted Does patient have access to weapons?:  (UTA) Criminal Charges Pending?:  (UTA) Does patient have a court date:  (UTA) Is patient on probation?: Unknown  Psychosis Hallucinations:  (UTA) Delusions: Persecutory, Grandiose  Mental Status Report Appearance/Hygiene: In scrubs Eye Contact: Good Motor Activity: Unremarkable Speech: Soft Level of Consciousness: Alert Mood: Irritable Affect: Angry, Irritable Anxiety Level: None Thought Processes: Flight of Ideas Judgement: Impaired Orientation: Not oriented Obsessive Compulsive Thoughts/Behaviors: Moderate  Cognitive Functioning Concentration: Decreased Memory: Recent Impaired, Remote Impaired IQ: Average Insight: Poor Impulse Control: Unable to Assess Appetite:  (UTA) Weight Loss:  (UTA) Weight Gain:  (UTA) Sleep: Unable to Assess Total Hours of Sleep:  (UTA) Vegetative Symptoms: Unable to Assess  ADLScreening [facility] Assessment Services) Patient's cognitive ability adequate to safely complete daily activities?: Yes Patient able to express need for assistance with ADLs?: Yes Independently performs ADLs?: Yes (appropriate for developmental age)  Prior Inpatient Therapy Prior Inpatient Therapy: Yes Prior Therapy Dates: [facility] Prior Therapy  Facilty/Provider(s): Multiple, last admitted to [facility] 10/2016 Reason for Treatment: Schizophrenia  Prior Outpatient Therapy Prior Outpatient Therapy:  (UTA) Does patient have an ACCT team?: Yes Does patient have Intensive In-House Services?  : No Does patient have [facility] services? : Unknown Does patient have P4CC services?: Unknown  ADL Screening (condition at time of admission) Patient's cognitive ability adequate to safely complete daily activities?: Yes Is the patient deaf or have difficulty hearing?: No Does the patient have difficulty seeing, even when wearing glasses/contacts?: No Does the patient have difficulty concentrating, remembering, or making decisions?: Yes Patient able to express need for assistance with ADLs?: Yes Does the patient have difficulty dressing or bathing?:  (UTA) Independently performs ADLs?: Yes (appropriate for developmental age) Does the patient have difficulty walking or climbing stairs?:  (No difficulty with ambulation observed) Weakness of Legs:  (UTA) Weakness of Arms/Hands:  (UTA)  Home Assistive Devices/Equipment Home Assistive Devices/Equipment: None  Therapy Consults (therapy consults require a physician order) PT Evaluation Needed: No OT Evalulation Needed: No SLP Evaluation Needed: No Abuse/Neglect Assessment (Assessment to be complete while patient is alone) Physical Abuse:  (UTA due to pt presentation) Verbal Abuse:  (UTA due to pt presentation) Sexual Abuse:  (UTA due to pt presentation) Exploitation of patient/patient's resources:  (UTA due to pt presentation) Self-Neglect:  (UTA due to pt presentation) Values / Beliefs Cultural Requests During Hospitalization: None Spiritual Requests During Hospitalization: None Consults Spiritual Care Consult Needed: No Social Work 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

## 2017-02-11 NOTE — Consult Note (Signed)
[facility] Consult   Reason for Consult:  Consult for 51 year old man with a history of schizophrenia brought in under commitment by his act team Referring Physician:  Alfred Levins Patient Identification: Kenneth [name] MRN:  [id number] Principal Diagnosis: Undifferentiated schizophrenia [facility]) Diagnosis:   Patient Active Problem List   Diagnosis Date Noted  . HTN (hypertension) [I10] 10/21/2016  . Undifferentiated schizophrenia ([place]) [F20.3] 10/12/2016  . Tobacco use disorder [F17.200] 02/05/2016  . Antisocial personality disorder ([place]) [F60.2] 01/23/2016  . Noncompliance [Z91.19] 01/23/2016    Total Time spent with patient: 1 hour  Subjective:   Kenneth [name] is a 51 y.o. male patient admitted with "I don't need to be here, I don't have any mental illness".  HPI:  Patient seen and interviewed chart reviewed. Patient known from previous encounters. This is a 51 year old man with a long history of schizophrenia. He was brought to the emergency room today when his act team exercise to their option to enforce his outpatient commitment. They report that he appears to be getting more psychotic. Acting bizarrely and argumentative. Stating that he is refusing to take his medicine. Patient told me in the emergency room that he was stopping all of his medicine because he does not have any mental illness. Later on he changed his tune about this when he saw that it was a big deal but I think that it's clear that he's been intending to stop his medicine. Patient has no insight and is really not able to give much of a useful history. He claims that he hasn't been agitated or loud or having any hallucinations. Denies any drug or alcohol use. Doesn't report any particular new stresses.  Medical history: He has mild high blood pressure but otherwise is in remarkably good health for a man his age.  Social history: Patient is currently living in an independent home. Multiple efforts have  been made to get him to cooperate with appropriate placement but he always refuses and his history of behavior problems make him unwelcome at most group home settings. He insists on living independently. He has been allowing the act team to work with him for the last couple months.  Substance abuse history: He smokes but he does not drink or use drugs.  Past Psychiatric History: Long history of schizophrenia. Multiple hospitalizations at multiple facilities [facility] state hospital. When he is grossly psychotic he is capable of dangerous behavior including kidnapping and aggressive violence. I don't believe he has ever really tried to kill himself. Patient has a strong tendency towards noncompliance and refuses to allow long-acting injectable medicine. He has a particular fondness for Trilafon which is usually the one medicine is easiest to convince him to take.  Risk to Self: Is patient at risk for suicide?: No Risk to Others:   Prior Inpatient Therapy:   Prior Outpatient Therapy:    Past Medical History:  Past Medical History:  Diagnosis Date  . Anxiety   . Hypercholesteremia   . Hypertension   . Schizophrenia [facility])     Past Surgical History:  Procedure Laterality Date  . CIRCUMCISION, NON-NEWBORN     Family History: No family history on file. Family Psychiatric  History: Does not know of any family history of mental illness Social History:  History  Alcohol Use  . Yes    Comment: social drinker     History  Drug Use No    Social History   Social History  . Marital status: Legally Separated  Spouse name: N/A  . Number of children: N/A  . Years of education: N/A   Social History Main Topics  . Smoking status: Current Every Day Smoker    Packs/day: 2.00    Types: Cigarettes  . Smokeless tobacco: Never Used  . Alcohol use Yes     Comment: social drinker  . Drug use: No  . Sexual activity: Not Asked   Other Topics Concern  . None   Social History Narrative   . None   Additional Social History:    Allergies:   Allergies  Allergen Reactions  . Haldol [Haloperidol] Other (See Comments)    "lock up"  . Loxapine     "Locks ups"  . Penicillins Other (See Comments)    Neck swells Has patient had a PCN reaction causing immediate rash, facial/tongue/throat swelling, SOB or lightheadedness with hypotension: yes Has patient had a PCN reaction causing severe rash involving mucus membranes or skin necrosis: no Has patient had a PCN reaction that required hospitalization:  no Has patient had a PCN reaction occurring within the last 10 years: no If all of the above answers are "NO", then may proceed with Cephalosporin use.     Labs:  Results for orders placed or performed during the hospital encounter of 02/11/17 (from the past 48 hour(s))  Comprehensive metabolic panel     Status: Abnormal   Collection Time: 02/11/17  2:12 PM  Result Value Ref Range   Sodium 132 (L) 135 - 145 mmol/L   Potassium 3.6 3.5 - 5.1 mmol/L   Chloride 97 (L) 101 - 111 mmol/L   CO2 24 22 - 32 mmol/L   Glucose, Bld 113 (H) 65 - 99 mg/dL   BUN 6 6 - 20 mg/dL   Creatinine, Ser 0.95 0.61 - 1.24 mg/dL   Calcium 9.0 8.9 - 10.3 mg/dL   Total Protein 7.8 6.5 - 8.1 g/dL   Albumin 4.4 3.5 - 5.0 g/dL   AST 25 15 - 41 U/L   ALT 25 17 - 63 U/L   Alkaline Phosphatase 115 38 - 126 U/L   Total Bilirubin 0.8 0.3 - 1.2 mg/dL   GFR calc non Af Amer >60 >60 mL/min   GFR calc Af Amer >60 >60 mL/min    Comment: (NOTE) The eGFR has been calculated using the CKD EPI equation. This calculation has not been validated in all clinical situations. eGFR's persistently <60 mL/min signify possible Chronic Kidney Disease.    Anion gap 11 5 - 15  Ethanol     Status: None   Collection Time: 02/11/17  2:12 PM  Result Value Ref Range   Alcohol, Ethyl (B) <10 <10 mg/dL    Comment:        LOWEST DETECTABLE LIMIT FOR SERUM ALCOHOL IS 10 mg/dL FOR MEDICAL PURPOSES ONLY Please note change in  reference range.   cbc     Status: None   Collection Time: 02/11/17  2:12 PM  Result Value Ref Range   WBC 7.8 3.8 - 10.6 K/uL   RBC 5.19 4.40 - 5.90 MIL/uL   Hemoglobin 16.2 13.0 - 18.0 g/dL   HCT 46.7 40.0 - 52.0 %   MCV 89.9 80.0 - 100.0 fL   MCH 31.2 26.0 - 34.0 pg   MCHC 34.7 32.0 - 36.0 g/dL   RDW 14.5 11.5 - 14.5 %   Platelets 328 150 - 440 K/uL    Current Facility-Administered Medications  Medication Dose Route Frequency Provider Last Rate Last  Dose  . atenolol (TENORMIN) tablet 12.5 mg  12.5 mg Oral Daily Alfred Levins, [place], MD      . atenolol (TENORMIN) tablet 12.5 mg  12.5 mg Oral Daily Clapacs, John T, MD      . OLANZapine zydis (ZYPREXA) disintegrating tablet 30 mg  30 mg Oral QHS Clapacs, John T, MD      . perphenazine (TRILAFON) tablet 16 mg  16 mg Oral TID Clapacs, Madie [place], MD       Current Outpatient Prescriptions  Medication Sig Dispense Refill  . atenolol (TENORMIN) 25 MG tablet Take 0.5 tablets (12.5 mg total) by mouth daily. 30 tablet 1  . OLANZapine zydis (ZYPREXA) 15 MG disintegrating tablet Take 2 tablets (30 mg total) by mouth daily. 60 tablet 1  . perphenazine (TRILAFON) 16 MG tablet Take 1 tablet (16 mg total) by mouth 3 (three) times daily. 270 tablet 1    Musculoskeletal: Strength & Muscle Tone: within normal limits Gait & Station: normal Patient leans: N/A  Psychiatric Specialty Exam: Physical Exam  Nursing note and vitals reviewed. Constitutional: He appears well-developed and well-nourished.  HENT:  Head: Normocephalic and atraumatic.  Eyes: Pupils are equal, round, and reactive to light. Conjunctivae are normal.  Neck: Normal range of motion.  Cardiovascular: Regular rhythm and normal heart sounds.   Respiratory: Effort normal.  GI: Soft.  Musculoskeletal: Normal range of motion.  Neurological: He is alert.  Skin: Skin is warm and dry.  Psychiatric: His affect is angry and labile. His speech is rapid and/or pressured and tangential. He  is agitated. He is not aggressive. Thought content is paranoid and delusional. Cognition and memory are impaired. He expresses impulsivity. He exhibits abnormal recent memory.    Review of Systems  Constitutional: Negative.   HENT: Negative.   Eyes: Negative.   Respiratory: Negative.   Cardiovascular: Negative.   Gastrointestinal: Negative.   Musculoskeletal: Negative.   Skin: Negative.   Neurological: Negative.   Psychiatric/Behavioral: Negative for depression, hallucinations, memory loss, substance abuse and suicidal ideas. The patient is nervous/anxious and has insomnia.     Blood pressure (!) 184/100, pulse (!) 106, temperature 98.2 F (36.8 C), temperature source Oral, resp. rate 18, height 5' 11" (1.803 m), weight 111.6 kg (246 lb), SpO2 99 %.Body mass index is 34.31 kg/m.  General Appearance: Disheveled  Eye Contact:  Good  Speech:  Garbled and Pressured  Volume:  Increased  Mood:  Angry and Irritable  Affect:  Congruent  Thought Process:  Disorganized  Orientation:  Full (Time, Place, and Person)  Thought Content:  Illogical, Delusions, Ideas of Reference:   Paranoia, Paranoid Ideation and Tangential  Suicidal Thoughts:  No  Homicidal Thoughts:  Yes.  without intent/plan  Memory:  Immediate;   Fair Recent;   Fair Remote;   Poor  Judgement:  Poor  Insight:  Lacking  Psychomotor Activity:  Restlessness  Concentration:  Concentration: Poor  Recall:  [employer] of Knowledge:  Fair  Language:  Fair  Akathisia:  No  Handed:  Right  AIMS (if indicated):     Assets:  Catering manager Housing Physical Health Social Support  ADL's:  Intact  Cognition:  Impaired,  Mild  Sleep:        Treatment Plan Summary: Daily contact with patient to assess and evaluate symptoms and progress in treatment, Medication management and Plan 51 year old man with a history of psychosis. Act team has been following him closely and seemed to feel strongly that he is  decompensating. He does seem more agitated and disorganized than when he was discharged a couple months ago. He's making multiple delusional statements and is pretty disorganized in his thinking. He got hostile and angry and made a couple of veiled threats but hasn't been physically aggressive yet. Patient had initially said he was refusing to take any more medicine although he now tells me that he takes it. I think that given that they had gone to the trouble of getting the outpatient commitment in place for exactly this circumstance it is only) we take advantage of it and admit him to the hospital. If he can be stabilized at this point it is much better than waiting until his psychosis reaches a point of extreme behavior. Restart medicines as they were when he was discharged. Full set of labs repeated.  Disposition: Recommend psychiatric Inpatient admission when medically cleared. Supportive therapy provided about ongoing stressors.  Alethia Berthold, MD 02/11/2017 4:56 PM

## 2017-02-11 NOTE — ED Notes (Signed)
Patient admitted to [facility] to room 4. Patient alert and verbal. Patient upset because he is here and doesn't understand why. Patient believes that he is head of [employer] and being hide here for protection. Patient did not want to engage in any further conversation with this Clinical research associate and ended conversation. Patient oriented to unit and room. Q 15 minute checks in place and patient remains safe on unit.

## 2017-02-11 NOTE — BH Assessment (Signed)
Patient is to be admitted to [facility] by Dr. Toni Amend.  Attending Physician will be Dr. Jennet Maduro.   Patient has been assigned to room 315, by [facility] Charge Nurse .   Intake Paper Work has been signed and placed on patient chart.  ER staff is aware of the admission [facility].;  Everardo [facility] Patient's Nurse & Karolee Stamps Patient Access).

## 2017-02-11 NOTE — ED Provider Notes (Signed)
[facility] Emergency Department Provider Note  ____________________________________________  Time seen: Approximately 4:06 PM  I have reviewed the triage vital signs and the nursing notes.   HISTORY  Chief Complaint IVC and Psychiatric Evaluation  Level 5 caveat:  Portions of the history and physical were unable to be obtained due to flight of ideas and inability to answer questions coherently   HPI Kenneth [name] is a 51 y.o. male with a history of schizophrenia, antisocial personality disorder,and hypertension who presents IVC by ACT team for medication noncompliance and auditory hallucinations. According to IVC paperwork patient has not been taking his medications. He is delusional and believes that he owns [employer] Patient was found talking to somebody who wasn't there when the ACT team arrived to his house. Patient has been having auditory hallucinations and is disheveled. Patient tells me that he has done nothing and the ACT lady is part of scheme to get him in trouble and back to the hospital. He said his psychiatrist took him off of his medications a few months ago and that is why he stopped taking his meds. He denies suicidal or homicidal ideation.   Past Medical History:  Diagnosis Date  . Anxiety   . Hypercholesteremia   . Hypertension   . Schizophrenia [facility])     Patient Active Problem List   Diagnosis Date Noted  . HTN (hypertension) 10/21/2016  . 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 Tobacco use disorder 02/05/2016  . Antisocial personality disorder (HCC) 01/23/2016  . Noncompliance 01/23/2016    Past Surgical History:  Procedure Laterality Date  . CIRCUMCISION, NON-NEWBORN      Prior to Admission medications   Medication Sig Start Date End Date Taking? Authorizing Provider  atenolol (TENORMIN) 25 MG tablet Take 0.5 tablets (12.5 mg total) by mouth daily. 12/03/16   [name], [name], MD  OLANZapine  zydis (ZYPREXA) 15 MG disintegrating tablet Take 2 tablets (30 mg total) by mouth daily. 12/03/16   [name], Braulio Conte B, MD  perphenazine (TRILAFON) 16 MG tablet Take 1 tablet (16 mg total) by mouth 3 (three) times daily. 12/02/16   [name], Ellin Goodie, MD    Allergies Haldol [haloperidol]; Loxapine; and Penicillins  No family history on file.  Social History Social History  Substance Use Topics  . Smoking status: Current Every Day Smoker    Packs/day: 2.00    Types: Cigarettes  . Smokeless tobacco: Never Used  . Alcohol use Yes     Comment: social drinker    Review of Systems  Constitutional: Negative for fever. Eyes: Negative for visual changes. ENT: Negative for sore throat. Neck: No neck pain  Cardiovascular: Negative for chest pain. Respiratory: Negative for shortness of breath. Gastrointestinal: Negative for abdominal pain, vomiting or diarrhea. Genitourinary: Negative for dysuria. Musculoskeletal: Negative for back pain. Skin: Negative for rash. Neurological: Negative for headaches, weakness or numbness. Psych: No SI or HI. + AH  ____________________________________________   PHYSICAL EXAM:  VITAL SIGNS: ED Triage Vitals [02/11/17 1412]  Enc Vitals Group     BP (!) 184/100     Pulse Rate (!) 106     Resp 18     Temp 98.2 F (36.8 C)     Temp Source Oral     SpO2 99 %     Weight 246 lb (111.6 kg)     Height  (1.803 m)     Head Circumference      Peak Flow  Pain Score      Pain Loc      Pain Edu?      Excl. in GC?     Constitutional: Alert and oriented, disheveled, slurred speech HEENT:      Head: Normocephalic and atraumatic.         Eyes: Conjunctivae are normal. Sclera is non-icteric.       Mouth/Throat: Mucous membranes are moist.       Neck: Supple with no signs of meningismus. Cardiovascular: Regular rate and rhythm. No murmurs, gallops, or rubs. 2+ symmetrical distal pulses are present in all extremities. No JVD. Respiratory:  Normal respiratory effort. Lungs are clear to auscultation bilaterally. No wheezes, crackles, or rhonchi.  Gastrointestinal: Soft, non tender, and non distended with positive bowel sounds. No rebound or guarding. Musculoskeletal: Nontender with normal range of motion in all extremities. No edema, cyanosis, or erythema of extremities. Neurologic: Slurred and pressured speech. Face is symmetric. Moving all extremities. No gross focal neurologic deficits are appreciated. Skin: Skin is warm, dry and intact. No rash noted. Psychiatric: slurred and pressured speech, flight of ideas, disheveled.  ____________________________________________   LABS (all labs ordered are listed, but only abnormal results are displayed)  Labs Reviewed  COMPREHENSIVE METABOLIC PANEL - Abnormal; Notable for the following:       Result Value   Sodium 132 (*)    Chloride 97 (*)    Glucose, Bld 113 (*)    All other components within normal limits  ETHANOL  CBC  URINE DRUG SCREEN, QUALITATIVE ([facility] ONLY)   ____________________________________________  EKG  none  ____________________________________________  RADIOLOGY  none  ____________________________________________   PROCEDURES  Procedure(s) performed: None Procedures Critical Care performed:  None ____________________________________________   INITIAL IMPRESSION / ASSESSMENT AND PLAN / ED COURSE  51 y.o. male with a history of schizophrenia, antisocial personality disorder,and hypertension who presents IVC by ACT team for medication noncompliance and auditory hallucinations. IVC orders will be maintained, psychiatry will be consulted. We'll restart patient on his antihypertensive medication. labs for medical clearance with no acute findings. Patient has no medical complaints at this time.     Pertinent labs & imaging results that were available during my care of the patient were reviewed by me and considered in my medical decision making (see  chart for details).    ____________________________________________   FINAL CLINICAL IMPRESSION(S) / ED DIAGNOSES  Final diagnoses:  Auditory hallucination  Non compliance w medication regimen      NEW MEDICATIONS STARTED DURING THIS VISIT:  New Prescriptions   No medications on file     Note:  This document was prepared using Dragon voice recognition software and may include unintentional dictation errors.    Nita Sickle, MD 02/11/17 [phone]

## 2017-02-12 ENCOUNTER — Encounter: Payer: Self-pay | Admitting: Psychiatry

## 2017-02-12 DIAGNOSIS — F203 Undifferentiated schizophrenia: Secondary | ICD-10-CM

## 2017-02-12 DIAGNOSIS — F250 Schizoaffective disorder, bipolar type: Secondary | ICD-10-CM | POA: Insufficient documentation

## 2017-02-12 MED ORDER — TRAZODONE HCL 100 MG PO TABS
200.0000 mg | ORAL_TABLET | Freq: Every day | ORAL | Status: DC
  Administered 2017-02-19: 200 mg via ORAL
  Filled 2017-02-12 (×7): qty 2

## 2017-02-12 MED ORDER — PERPHENAZINE 4 MG PO TABS
16.0000 mg | ORAL_TABLET | Freq: Three times a day (TID) | ORAL | Status: DC
  Administered 2017-02-12 – 2017-02-23 (×36): 16 mg via ORAL
  Filled 2017-02-12 (×36): qty 4

## 2017-02-12 MED ORDER — ATENOLOL 25 MG PO TABS
25.0000 mg | ORAL_TABLET | Freq: Every day | ORAL | Status: DC
  Administered 2017-02-12 – 2017-02-23 (×12): 25 mg via ORAL
  Filled 2017-02-12 (×12): qty 1

## 2017-02-12 MED ORDER — MAGNESIUM HYDROXIDE 400 MG/5ML PO SUSP: 30.0000 mL | Freq: Every day | ORAL | Status: DC | PRN

## 2017-02-12 MED ORDER — OLANZAPINE 5 MG PO TBDP
15.0000 mg | ORAL_TABLET | Freq: Three times a day (TID) | ORAL | Status: DC | PRN
  Filled 2017-02-12: qty 3

## 2017-02-12 MED ORDER — NICOTINE 21 MG/24HR TD PT24
21.0000 mg | MEDICATED_PATCH | Freq: Every day | TRANSDERMAL | Status: DC
  Administered 2017-02-20: 21 mg via TRANSDERMAL
  Filled 2017-02-12 (×4): qty 1

## 2017-02-12 MED ORDER — ALUM & MAG HYDROXIDE-SIMETH 200-200-20 MG/5ML PO SUSP: 30.0000 mL | ORAL | Status: DC | PRN

## 2017-02-12 MED ORDER — OLANZAPINE 10 MG PO TABS
30.0000 mg | ORAL_TABLET | Freq: Every day | ORAL | Status: DC
  Administered 2017-02-13 – 2017-02-22 (×10): 30 mg via ORAL
  Filled 2017-02-12 (×10): qty 3

## 2017-02-12 MED ORDER — ACETAMINOPHEN 325 MG PO TABS: 650.0000 mg | ORAL_TABLET | Freq: Four times a day (QID) | ORAL | Status: DC | PRN

## 2017-02-12 NOTE — H&P (Signed)
Psychiatric Admission Assessment Adult  Patient Identification: Kenneth [name] MRN:  [id number] Date of Evaluation:  02/12/2017 Chief Complaint:  schizophrenia Principal Diagnosis: <principal problem not specified> Diagnosis:   Patient Active Problem List   Diagnosis Date Noted  . Schizoaffective disorder, bipolar type ([place]) [F25.0] 02/12/2017  . HTN (hypertension) [I10] 10/21/2016  . Undifferentiated schizophrenia ([place]) [F20.3] 10/12/2016  . Tobacco use disorder [F17.200] 02/05/2016  . Noncompliance [Z91.19] 01/23/2016   History of Present Illness:   Identifying data. Kenneth [name] is a 51 year old male with a history of schizoaffective disorder.  Chief complaint. "The doctor agreed with me but the nurse went behind his back."  History of present illness. Information was obtained from the patient and the chart. The patient was sent to the emergency room by his ACT team. He met with three member of the team, including a new psychiatrist, and told them that he has no mental illness and has not been taking medications. He was disorganized, delusional, grandiose and irritable. Kenneth [name] is well known to [place]. This is his fourth or fifth hospitalization here. He was in the hospital for over 50 days in June/July. He was discharged to his independent apartment on a combination of Trilafon and Zyprexa. It is very likely, that he stopped taking medications right away. The patient usually does well on Trilafon when compliant. The patient himself denies any symptoms of depression, anxiety, psychosis, symptoms suggestive of bipolar mania, or substance abuse. During the interview, he is intrusive, talkative, grandiose, and paranoid. He believes that his act team put him in the hospital to "get his stuff". This time he arrives with no Bible or his favorite edition of Milton's "Lemmie Evens lost". He recognizes me from previus admissions, is quite argumentative and demanding discharge.  Past psychiatric  history. There is a long history of mental illness with multiple hospitalizations including extended stays at [facility]. There is a history of violent behaviors with incarceration. He has been tried on numerous medications but likes to take Trilafon. He frequently is noncompliant with medication. He did well on Clozaril and Trileptal in the past. There were suicide attempts in the remote past.   Family psychiatric history. He reports other family members with psychiatric problems but denies having any psychiatric problems himself.  Social history. The patient lives independently in an apartment. He follows up with [facility]. He has [facility] managed Medicaid. He apparently has a payee. He denies substance use but UDS was not done on admission.  Total Time spent with patient: 1 hour  Is the patient at risk to self? No.  Has the patient been a risk to self in the past 6 months? No.  Has the patient been a risk to self within the distant past? No.  Is the patient a risk to others? No.  Has the patient been a risk to others in the past 6 months? No.  Has the patient been a risk to others within the distant past? No.   Prior Inpatient Therapy:   Prior Outpatient Therapy:    Alcohol Screening: Patient refused Alcohol Screening Tool: Yes Substance Abuse History in the last 12 months:  No. Consequences of Substance Abuse: NA Previous Psychotropic Medications: Yes  Psychological Evaluations: No  Past Medical History:  Past Medical History:  Diagnosis Date  . Anxiety   . Hypercholesteremia   . Hypertension   . Schizophrenia [facility])     Past Surgical History:  Procedure Laterality Date  . [place], NON-NEWBORN     Family  History: History reviewed. No pertinent family history.  Tobacco Screening: Have you used any form of tobacco in the last 30 days? (Cigarettes, Smokeless Tobacco, Cigars, and/or Pipes): Patient Refused Screening Social History:  History  Alcohol Use  . Yes    Comment:  social drinker     History  Drug Use No    Additional Social History:                           Allergies:   Allergies  Allergen Reactions  . Haldol [Haloperidol] Other (See Comments)    "lock up"  . Loxapine     "Locks ups"  . Penicillins Other (See Comments)    Neck swells Has patient had a PCN reaction causing immediate rash, facial/tongue/throat swelling, SOB or lightheadedness with hypotension: yes Has patient had a PCN reaction causing severe rash involving mucus membranes or skin necrosis: no Has patient had a PCN reaction that required hospitalization:  no Has patient had a PCN reaction occurring within the last 10 years: no If all of the above answers are "NO", then may proceed with Cephalosporin use.    Lab Results:  Results for orders placed or performed during the hospital encounter of 02/11/17 (from the past 48 hour(s))  Comprehensive metabolic panel     Status: Abnormal   Collection Time: 02/11/17  2:12 PM  Result Value Ref Range   Sodium 132 (L) 135 - 145 mmol/L   Potassium 3.6 3.5 - 5.1 mmol/L   Chloride 97 (L) 101 - 111 mmol/L   CO2 24 22 - 32 mmol/L   Glucose, Bld 113 (H) 65 - 99 mg/dL   BUN 6 6 - 20 mg/dL   Creatinine, Ser 0.95 0.61 - 1.24 mg/dL   Calcium 9.0 8.9 - 10.3 mg/dL   Total Protein 7.8 6.5 - 8.1 g/dL   Albumin 4.4 3.5 - 5.0 g/dL   AST 25 15 - 41 U/L   ALT 25 17 - 63 U/L   Alkaline Phosphatase 115 38 - 126 U/L   Total Bilirubin 0.8 0.3 - 1.2 mg/dL   GFR calc non Af Amer >60 >60 mL/min   GFR calc Af Amer >60 >60 mL/min    Comment: (NOTE) The eGFR has been calculated using the CKD EPI equation. This calculation has not been validated in all clinical situations. eGFR's persistently <60 mL/min signify possible Chronic Kidney Disease.    Anion gap 11 5 - 15  Ethanol     Status: None   Collection Time: 02/11/17  2:12 PM  Result Value Ref Range   Alcohol, Ethyl (B) <10 <10 mg/dL    Comment:        LOWEST DETECTABLE LIMIT  FOR SERUM ALCOHOL IS 10 mg/dL FOR MEDICAL PURPOSES ONLY Please note change in reference range.   cbc     Status: None   Collection Time: 02/11/17  2:12 PM  Result Value Ref Range   WBC 7.8 3.8 - 10.6 K/uL   RBC 5.19 4.40 - 5.90 MIL/uL   Hemoglobin 16.2 13.0 - 18.0 g/dL   HCT 46.7 40.0 - 52.0 %   MCV 89.9 80.0 - 100.0 fL   MCH 31.2 26.0 - 34.0 pg   MCHC 34.7 32.0 - 36.0 g/dL   RDW 14.5 11.5 - 14.5 %   Platelets 328 150 - 440 K/uL    Blood Alcohol level:  Lab Results  Component Value Date   ETH <10 02/11/2017  ETH <5 37/02/6268    Metabolic Disorder Labs:  Lab Results  Component Value Date   HGBA1C 5.8 (H) 11/15/2016   MPG 120 11/15/2016   MPG 120 01/27/2016   Lab Results  Component Value Date   PROLACTIN 9.6 01/27/2016   Lab Results  Component Value Date   CHOL 256 (H) 11/15/2016   TRIG 204 (H) 11/15/2016   HDL 64 11/15/2016   CHOLHDL 4.0 11/15/2016   VLDL 41 (H) 11/15/2016   LDLCALC 151 (H) 11/15/2016   LDLCALC 139 (H) 01/27/2016    Current Medications: Current Facility-Administered Medications  Medication Dose Route Frequency Provider Last Rate Last Dose  . acetaminophen (TYLENOL) tablet 650 mg  650 mg Oral Q6H PRN Margeaux Swantek B, MD      . alum & mag hydroxide-simeth (MAALOX/MYLANTA) 200-200-20 MG/5ML suspension 30 mL  30 mL Oral Q4H PRN Colinda Barth B, MD      . atenolol (TENORMIN) tablet 25 mg  25 mg Oral Daily Jacody Beneke B, MD   25 mg at 02/12/17 0844  . magnesium hydroxide (MILK OF MAGNESIA) suspension 30 mL  30 mL Oral Daily PRN Jakyren Fluegge B, MD      . nicotine (NICODERM CQ - dosed in mg/24 hours) patch 21 mg  21 mg Transdermal Q0600 Chistine Dematteo B, MD      . OLANZapine (ZYPREXA) tablet 30 mg  30 mg Oral QHS Ninetta Adelstein B, MD      . OLANZapine zydis (ZYPREXA) disintegrating tablet 15 mg  15 mg Oral TID PRN Demtrius Rounds B, MD      . perphenazine (TRILAFON) tablet 16 mg  16 mg Oral TID Dyland Panuco,  Allon Costlow B, MD   16 mg at 02/12/17 0843  . traZODone (DESYREL) tablet 200 mg  200 mg Oral QHS Kingstyn Deruiter B, MD       PTA Medications: Prescriptions Prior to Admission  Medication Sig Dispense Refill Last Dose  . atenolol (TENORMIN) 25 MG tablet Take 0.5 tablets (12.5 mg total) by mouth daily. 30 tablet 1   . OLANZapine zydis (ZYPREXA) 15 MG disintegrating tablet Take 2 tablets (30 mg total) by mouth daily. 60 tablet 1   . perphenazine (TRILAFON) 16 MG tablet Take 1 tablet (16 mg total) by mouth 3 (three) times daily. 270 tablet 1     Musculoskeletal: Strength & Muscle Tone: within normal limits Gait & Station: normal Patient leans: N/A  Psychiatric Specialty Exam: I reviewed physical examination performed in the ER and agree with the findings. Physical Exam  Nursing note and vitals reviewed. Psychiatric: His affect is angry and labile. His speech is rapid and/or pressured. He is hyperactive and actively hallucinating. Thought content is paranoid and delusional. Cognition and memory are impaired. He expresses impulsivity.    Review of Systems  Constitutional: Negative.   HENT: Negative.   Eyes: Negative.   Respiratory: Negative.   Cardiovascular: Negative.   Gastrointestinal: Negative.   Genitourinary: Negative.   Musculoskeletal: Negative.   Skin: Negative.   Neurological: Negative.   Endo/Heme/Allergies: Negative.   Psychiatric/Behavioral: Positive for hallucinations.    Blood pressure (!) 151/90, pulse 74, temperature 98.4 F (36.9 C), temperature source Oral, resp. rate 20, SpO2 100 %.There is no height or weight on file to calculate BMI.  See SRA.  Sleep:  Number of Hours: 5.25    Treatment Plan Summary: Daily contact with patient to assess and evaluate symptoms and progress in treatment and Medication management   Mr. Tetrault is a 51 year old male with a history of schizophrenia admitted  floridly psychotic in the context of treatment noncompliance.  1. Schizophrenia. We restarted Trilafon 16 mg tid and Zyprexa 30 mg qhs for psychosis. So far he refuses.   2. HTN. The patient consistently refuses atenolol.  3. Metabolic syndrome monitoring. Lipid panel, TSH and HgbA1C were done in June 2018 and are normal. .   4. EKG. Normal sinus rhythm, QTc 405.  5. History of violence. There is a history of incarceration for kidnapping of a Education officer, museum.   6. Social issues. The patient has two care coordinators at [facility] for outpatient and inpatient services. A letter requesting additional founding for higher level of care, under "living high" condition, was sent to [facility].   7. Disposition. The patient will likely return to his apartment and follow up with [facility] but working with this patient has been increasingly difficult. During previous hospitalization, we attempted to find a group home to provide structured environment and improve medication compliance but no facility would accept the patient. He was not accepted at [facility] either.    Observation Level/Precautions:  15 minute checks  Laboratory:  CBC Chemistry Profile UDS UA  Psychotherapy:    Medications:    Consultations:    Discharge Concerns:    Estimated LOS:  Other:     Physician Treatment Plan for Primary Diagnosis: <principal problem not specified> Long Term Goal(s): Improvement in symptoms so as ready for discharge  Short Term Goals: Ability to identify changes in lifestyle to reduce recurrence of condition will improve, Ability to verbalize feelings will improve, Ability to disclose and discuss suicidal ideas, Ability to demonstrate self-control will improve, Ability to identify and develop effective coping behaviors will improve and Compliance with prescribed medications will improve  Physician Treatment Plan for Secondary Diagnosis: Active Problems:   Noncompliance   Tobacco use disorder    Undifferentiated schizophrenia ([place])   HTN (hypertension)  Long Term Goal(s): NA  Short Term Goals: NA  I certify that inpatient services furnished can reasonably be expected to improve the patient's condition.    Orson Slick, MD 10/4/201810:06 AM

## 2017-02-12 NOTE — Tx Team (Signed)
Initial Treatment Plan 02/12/2017 12:15 AM Athena Masse AVW:[id number]    PATIENT STRESSORS: Health problems Medication change or noncompliance   PATIENT STRENGTHS: Capable of independent living Communication skills   PATIENT IDENTIFIED PROBLEMS: Altered Thought Process  Med noncompliance                   DISCHARGE CRITERIA:  Adequate post-discharge living arrangements Improved stabilization in mood, thinking, and/or behavior  PRELIMINARY DISCHARGE PLAN: Outpatient therapy  PATIENT/FAMILY INVOLVEMENT: This treatment plan has been presented to and reviewed with the patient, FILLMORE BYNUM, and/or family member.  The patient and family have been given the opportunity to ask questions and make suggestions.  Elbert Ewings, RN 02/12/2017, 12:15 AM

## 2017-02-12 NOTE — Progress Notes (Signed)
Received Kenneth [name] this am in his room. Before I entered his room this writer viewed him pacing and talking very loudly to himself. He was able to redirect his attention to this Clinical research associate. Later he ate breakfast and received his medications without incident. This Clinical research associate approached him to inquire about his mood and he got very upset. He wanted to know why I was asking him  questions about his mood. He stated he does not have mood swings and walked away. He remained irritable through out the day and was compliant with his medication.

## 2017-02-12 NOTE — [facility] Counselor (Signed)
CSW attempted to complete PSA with pt on this date.  Pt is delusional and unable to provide accurate information. Pt refused to sign releases. Garner Nash, MSW, LCSW Clinical Social Worker 02/12/2017 1:31 PM
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
## 2017-02-12 NOTE — BHH Group Notes (Signed)
LCSW Group Therapy Note  02/12/2017 1:15pm  Type of Therapy/Topic:  Group Therapy:  Balance in Life  Participation Level:  Did Not Attend  Description of Group:    This group will address the concept of balance and how it feels and looks when one is unbalanced. Patients will be encouraged to process areas in their lives that are out of balance and identify reasons for remaining unbalanced. Facilitators will guide patients in utilizing problem-solving interventions to address and correct the stressor making their life unbalanced. Understanding and applying boundaries will be explored and addressed for obtaining and maintaining a balanced life. Patients will be encouraged to explore ways to assertively make their unbalanced needs known to significant others in their lives, using other group members and facilitator for support and feedback.  Therapeutic Goals: 1. Patient will identify two or more emotions or situations they have that consume much of in their lives. 2. Patient will identify signs/triggers that life has become out of balance:  3. Patient will identify two ways to set boundaries in order to achieve balance in their lives:  4. Patient will demonstrate ability to communicate their needs through discussion and/or role plays  Summary of Patient Progress:      Therapeutic Modalities:   Cognitive Behavioral Therapy Solution-Focused Therapy Assertiveness Training  Anne C Cunningham, LCSW 02/12/2017 2:23 PM  

## 2017-02-12 NOTE — Plan of Care (Signed)
Problem: Education: Goal: Will be free of psychotic symptoms Outcome: Not Progressing Positive for auditory hallucinations. Observed.  Problem: Safety: Goal: Ability to redirect hostility and anger into socially appropriate behaviors will improve Outcome: Not Progressing Extremely upset about his medication incorrectly named.

## 2017-02-12 NOTE — Progress Notes (Signed)
Patient admitted to behavioral medicine unit. Patient defensive on approach and paranoid, kept questioning writer about what she was writing during admission assessment. He was also voicing delusions of being the creator. Thought process seemed to be scattered, although he denies depression, suicidal thoughts, homicidal thoughts or hallucinations. Patient was resistant to come down initially from the [facility] but with encouragement form security and the nurse supervisor, patient was brought down to the unit. He is paranoid, searched upon admission by two nurses no contraband found.

## 2017-02-12 NOTE — BHH Group Notes (Signed)
BHH Group Notes:  (Nursing/MHT/Case Management/Adjunct)  Date:  02/12/2017  Time:  11:21 PM  Type of Therapy:  Psychoeducational Skills  Participation Level:  Did Not Attend  Summary of Progress/Problems:  [name] 02/12/2017, 11:21 PM

## 2017-02-13 NOTE — BHH Counselor (Signed)
CSW spoke with pt again today.  Pt unable to provide any useful information for PSA.  Continues to be argumentative, angry, and delusional. Garner Nash, MSW, LCSW Clinical Social Worker 02/13/2017 1:09 PM
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
## 2017-02-13 NOTE — Progress Notes (Signed)
D: Pt denies SI/HI/AVH, but noted responding to internal stimuli. Patient is loud in the room, appears paranoid and suspicious of staff often asking if writer was part of conspiracy to hurt him. Patient's thoughts are disorganized, he is hostile, verbally aggressive and threatening staff. Patient appears anxious and he is not interacting with peers and staff appropriately.  A: Pt was offered support and encouragement. Pt was offered scheduled medications. Pt was encouraged to attend group.. Q 15 minute checks were done for safety.  R: Pt did not attend group, patient is not compliant with medication.  Pt has no complaints.Pt receptive to treatment and safety maintained on unit.

## 2017-02-13 NOTE — Tx Team (Signed)
Interdisciplinary Treatment and Diagnostic Plan Update  02/13/2017 Time of Session: [place] MRN: [id number]  Principal Diagnosis: Undifferentiated schizophrenia [facility])  Secondary Diagnoses: Principal Problem:   Undifferentiated schizophrenia ([place]) Active Problems:   Noncompliance   Tobacco use disorder   HTN (hypertension)   Current Medications:  Current Facility-Administered Medications  Medication Dose Route Frequency Provider Last Rate Last Dose  . acetaminophen (TYLENOL) tablet 650 mg  650 mg Oral Q6H PRN [name], [name], MD      . alum & mag hydroxide-simeth (MAALOX/MYLANTA) 200-200-20 MG/5ML suspension 30 mL  30 mL Oral Q4H PRN [name], [name], MD      . atenolol (TENORMIN) tablet 25 mg  25 mg Oral Daily [name], [name], MD   25 mg at 02/13/17 0808  . magnesium hydroxide (MILK OF MAGNESIA) suspension 30 mL  30 mL Oral Daily PRN [name], [name], MD      . nicotine (NICODERM CQ - dosed in mg/24 hours) patch 21 mg  21 mg Transdermal Q0600 [name], [name], MD      . OLANZapine (ZYPREXA) tablet 30 mg  30 mg Oral QHS [name], [name], MD      . OLANZapine zydis (ZYPREXA) disintegrating tablet 15 mg  15 mg Oral TID PRN [name], [name], MD      . perphenazine (TRILAFON) tablet 16 mg  16 mg Oral TID [name], [name], MD   16 mg at 02/13/17 1208  . traZODone (DESYREL) tablet 200 mg  200 mg Oral QHS [name], [name], MD       PTA Medications: Prescriptions Prior to Admission  Medication Sig Dispense Refill Last Dose  . atenolol (TENORMIN) 25 MG tablet Take 0.5 tablets (12.5 mg total) by mouth daily. 30 tablet 1   . OLANZapine zydis (ZYPREXA) 15 MG disintegrating tablet Take 2 tablets (30 mg total) by mouth daily. 60 tablet 1   . perphenazine (TRILAFON) 16 MG tablet Take 1 tablet (16 mg total) by mouth 3 (three) times daily. 270 tablet 1     Patient Stressors: Health problems Medication change or noncompliance  Patient  Strengths: Capable of independent living Communication skills  Treatment Modalities: Medication Management, Group therapy, Case management,  1 to 1 session with clinician, Psychoeducation, Recreational therapy.   Physician Treatment Plan for Primary Diagnosis: Undifferentiated schizophrenia ([place]) Long Term Goal(s): Improvement in symptoms so as ready for discharge NA   Short Term Goals: Ability to identify changes in lifestyle to reduce recurrence of condition will improve Ability to verbalize feelings will improve Ability to disclose and discuss suicidal ideas Ability to demonstrate self-control will improve Ability to identify and develop effective coping behaviors will improve Compliance with prescribed medications will improve NA  Medication Management: Evaluate patient's response, side effects, and tolerance of medication regimen.  Therapeutic Interventions: 1 to 1 sessions, Unit Group sessions and Medication administration.  Evaluation of Outcomes: Not Met  Physician Treatment Plan for Secondary Diagnosis: Principal Problem:   Undifferentiated schizophrenia ([place]) Active Problems:   Noncompliance   Tobacco use disorder   HTN (hypertension)  Long Term Goal(s): Improvement in symptoms so as ready for discharge NA   Short Term Goals: Ability to identify changes in lifestyle to reduce recurrence of condition will improve Ability to verbalize feelings will improve Ability to disclose and discuss suicidal ideas Ability to demonstrate self-control will improve Ability to identify and develop effective coping behaviors will improve Compliance with prescribed medications will improve NA     Medication  Management: Evaluate patient's response, side effects, and tolerance of medication regimen.  Therapeutic Interventions: 1 to 1 sessions, Unit Group sessions and Medication administration.  Evaluation of Outcomes: Not Met   RN Treatment Plan for Primary Diagnosis:  Undifferentiated schizophrenia ([place]) Long Term Goal(s): Knowledge of disease and therapeutic regimen to maintain health will improve  Short Term Goals: Ability to identify and develop effective coping behaviors will improve and Compliance with prescribed medications will improve  Medication Management: RN will administer medications as ordered by provider, will assess and evaluate patient's response and provide education to patient for prescribed medication. RN will report any adverse and/or side effects to prescribing provider.  Therapeutic Interventions: 1 on 1 counseling sessions, Psychoeducation, Medication administration, Evaluate responses to treatment, Monitor vital signs and CBGs as ordered, Perform/monitor CIWA, COWS, AIMS and Fall Risk screenings as ordered, Perform wound care treatments as ordered.  Evaluation of Outcomes: Not Met   LCSW Treatment Plan for Primary Diagnosis: Undifferentiated schizophrenia ([place]) Long Term Goal(s): Safe transition to appropriate next level of care at discharge, Engage patient in therapeutic group addressing interpersonal concerns.  Short Term Goals: Engage patient in aftercare planning with referrals and resources and Increase skills for wellness and recovery  Therapeutic Interventions: Assess for all discharge needs, 1 to 1 time with Social worker, Explore available resources and support systems, Assess for adequacy in community support network, Educate family and significant other(s) on suicide prevention, Complete Psychosocial Assessment, Interpersonal group therapy.  Evaluation of Outcomes: Not Met   Progress in Treatment: Attending groups: No. Participating in groups: No. Taking medication as prescribed: Yes. Toleration medication: Yes. Family/Significant other contact made: No, will contact:  when given permission Patient understands diagnosis: No. Discussing patient identified problems/goals with staff: No. Medical problems stabilized or  resolved: Yes. Denies suicidal/homicidal ideation: Yes. Issues/concerns per patient self-inventory: No. Other: none  New problem(s) identified: No, Describe:  none  New Short Term/Long Term Goal(s):Pt unable to articulate goal today.  Discharge Plan or Barriers: CSW assessing for appropriate plan.  Reason for Continuation of Hospitalization: Delusions  Medication stabilization  Estimated Length of Stay: 7 days.  Attendees: Patient:Kenneth [name] (refused to sign the acknolwedgement of  Meeting) 02/13/2017   Physician: Dr. [name], MD 02/13/2017   Nursing: Elige Radon, RN 02/13/2017   RN Care Manager: 02/13/2017   Social Worker: Lurline Idol, LCSW 02/13/2017   Recreational Therapist:  02/13/2017   Other:  02/13/2017   Other:  02/13/2017  Other: 02/13/2017     Scribe for Treatment Team: Joanne Chars, Protection 02/13/2017 12:47 PM

## 2017-02-13 NOTE — Plan of Care (Signed)
Problem: Safety: Goal: Ability to remain free from injury will improve Outcome: Not Progressing Easily angered.  Became verbally aggressive during treatment team. Had to be asked to calm down.

## 2017-02-13 NOTE — Progress Notes (Signed)
Patient isolates to his room.  No group attendance.  Up to dayroom for meals and to get something to drink. Reluctantly complies with medications.  Verbalizes that he is here because the doctor lied to him.  Easily irritated.  Denies AVH although observed pacing in room and talking loudly to unseen others.  Every 15 minute rounds maintained for safety.

## 2017-02-13 NOTE — Plan of Care (Signed)
Problem: Education: Goal: Knowledge of the prescribed therapeutic regimen will improve Outcome: Not Progressing Patient is not compliant with medication.

## 2017-02-13 NOTE — [facility] Group Notes (Signed)
[facility] LCSW Group Therapy Note  Date/Time: 02/13/17, 0930  Type of Therapy and Topic:  Group Therapy:  Feelings around Relapse and Recovery  Participation Level:  Did Not Attend   Mood:  Description of Group:    Patients in this group will discuss emotions they experience before and after a relapse. They will process how experiencing these feelings, or avoidance of experiencing them, relates to having a relapse. Facilitator will guide patients to explore emotions they have related to recovery. Patients will be encouraged to process which emotions are more powerful. They will be guided to discuss the emotional reaction significant others in their lives may have to patients' relapse or recovery. Patients will be assisted in exploring ways to respond to the emotions of others without this contributing to a relapse.  Therapeutic Goals: 1. Patient will identify two or more emotions that lead to relapse for them:  2. Patient will identify two emotions that result when they relapse:  3. Patient will identify two emotions related to recovery:  4. Patient will demonstrate ability to communicate their needs through discussion and/or role plays.   Summary of Patient Progress:     Therapeutic Modalities:   Cognitive Behavioral Therapy Solution-Focused Therapy Assertiveness Training Relapse Prevention Therapy  Greg Deniece Rankin, LCSW       
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
## 2017-02-14 NOTE — [facility] Suicide Risk Assessment (Signed)
[facility] INPATIENT:  Family/Significant Other Suicide Prevention Education  Suicide Prevention Education:  Patient Refusal for Family/Significant Other Suicide Prevention Education: The patient Kenneth [name] has refused to provide written consent for family/significant other to be provided Family/Significant Other Suicide Prevention Education during admission and/or prior to discharge.  Physician notified.  Cleda Daub LawsLCSW 02/14/2017, 4:36 PM

## 2017-02-14 NOTE — Plan of Care (Signed)
Problem: Education: Goal: Will be free of psychotic symptoms Outcome: Progressing Patient was not overheard responding to unseen others thus far this shift.  Contact with others is minimal.  He did not attend group but did have snack with peers in the dayroom.  Problem: Safety: Goal: Ability to redirect hostility and anger into socially appropriate behaviors will improve Outcome: Progressing Patient was observed sitting in the dayroom with peers and staff on rounds.  He was quiet when approached by Clinical research associate and acknowledged that I would be his nurse.  He stated, "I don't have medications tonight".  He did not argue when given information that we would review his medication list in the medication room.  At 2100, he was approached in his room and observed in bed in the dark.  He verbalized "not having any medications" in an angry tone but came to the medication room.  Writer reviewed medications.  Patient took the Zyprexa and refused the Desyrel.  His behavior in the medication room was appropriate.

## 2017-02-14 NOTE — [facility] Group Notes (Signed)
LCSW Group Therapy Note  02/14/2017 1:00pm  Type of Therapy and Topic:  Group Therapy:  Cognitive Distortions  Participation Level:  Did Not Attend   Description of Group:    Patients in this group will be introduced to the topic of cognitive distortions.  Patients will identify and describe cognitive distortions, describe the feelings these distortions create for them.  Patients will identify one or more situations in their personal life where they have cognitively distorted thinking and will verbalize challenging this cognitive distortion through positive thinking skills.  Patients will practice the skill of using positive affirmations to challenge cognitive distortions using affirmation cards.    Therapeutic Goals:  1. Patient will identify two or more cognitive distortions they have used 2. Patient will identify one or more emotions that stem from use of a cognitive distortion 3. Patient will demonstrate use of a positive affirmation to counter a cognitive distortion through discussion and/or role play. 4. Patient will describe one way cognitive distortions can be detrimental to wellness   Summary of Patient Progress:     Therapeutic Modalities:   Cognitive Behavioral Therapy Motivational Interviewing   Glennon Mac, LCSW 02/14/2017 2:29 PM

## 2017-02-14 NOTE — Plan of Care (Signed)
Problem: Activity: Goal: Will verbalize the importance of balancing activity with adequate rest periods Outcome: Not Progressing Patient continues to rest during the day as well as night.  Problem: Education: Goal: Will be free of psychotic symptoms Outcome: Not Progressing Patient still show signs of mental instability.  Problem: Safety: Goal: Ability to redirect hostility and anger into socially appropriate behaviors will improve Outcome: Progressing Patient continues to show signs of anger and mental instability. Goal: Ability to remain free from injury will improve Outcome: Progressing Patient remains free from injury.

## 2017-02-14 NOTE — [facility] Group Notes (Signed)
[facility] Group Notes:  (Nursing/MHT/Case Management/Adjunct)  Date:  02/14/2017  Time:  4:49 AM  Type of Therapy:  Psychoeducational Skills  Participation Level:  Did Not Attend  Summary of Progress/Problems:  Kenneth [name] 02/14/2017, 4:49 AM
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
## 2017-02-14 NOTE — Progress Notes (Signed)
Patient isolative to is room, was observed laying in bed this shift with covers over his head. Writer had to call patient several times for him to wake up and take his medications. He appeared flat in affect with an abrasive tone. Refused to have his vital signs done today. "I am not getting my blood pressure taken so just leave me alone."  Patient denied having thoughts of self-harm. Patient also denied  AH saying " I am not hearing anything right now."

## 2017-02-15 NOTE — [facility] Group Notes (Signed)
[facility] LCSW Group Therapy 02/15/2017 1:15pm  Type of Therapy: Group Therapy- Feelings Around Discharge & Establishing a Supportive Framework  Participation Level:  Did Not Attend  Description of Group:   What is a supportive framework? What does it look like feel like and how do I discern it from and unhealthy non-supportive network? Learn how to cope when supports are not helpful and don't support you. Discuss what to do when your family/friends are not supportive.   Therapeutic Modalities:   Cognitive Behavioral Therapy Person-Centered Therapy Motivational Interviewing   Verdene Lennert, LCSW 02/15/2017 12:57 PM

## 2017-02-15 NOTE — Progress Notes (Signed)
Patient has been pleasant and cooperative this shift.  No inappropriate behavior.  No interaction noted with peers.  Continues to talk to unseen others in the privacy of his room.  Medication compliant.  Good appetite.  Maintaining personal care chores.  Support offered.  Safety rounds maintained.

## 2017-02-15 NOTE — Plan of Care (Signed)
Problem: Safety: Goal: Ability to redirect hostility and anger into socially appropriate behaviors will improve Outcome: Progressing No outburst Goal: Ability to remain free from injury will improve Outcome: Progressing Remains safe on the unit

## 2017-02-15 NOTE — [facility] Group Notes (Signed)
[facility] Group Notes:  (Nursing/MHT/Case Management/Adjunct)  Date:  02/15/2017  Time:  4:58 AM  Type of Therapy:  Psychoeducational Skills  Participation Level:  Did Not Attend  Summary of Progress/Problems:  Kenneth [name] 02/15/2017, 4:58 AM

## 2017-02-15 NOTE — Progress Notes (Signed)
[facility] MD Progress Note  02/15/2017 5:22 PM JIA MOHAMED  MRN:  [id number]  Subjective:  Mr. Erline Levine is a 51 year old male with a history of schizophrenia who is notoriously noncomliant with treatment and has a history of severe violence leading to 15-year-long incarceration admitted for resurgence of hallucinations, paranoia and agitation.  02/13/2017. Mr. Kulikowski met with treatment team today. He has no insight into his problems, is very disorganized, insists on his mental sanity and insists on discharge. He was reminded that his last hospitalization only 3 months ago lasted 53 days. He was encouraged time and time again to take his medications as prescribed and to participate in program. When in privacy of his room he constantly argues with his voices.  02/14/17: The patient remains angry, paranoid and delusional. Insight and judgment are poor. He denies auditory or visual hallucinations but does appear to be responding to internal stimuli. He has been compliant with medications but does not participate in groups or programming on the unit. He has not been violent with other patients. He primarily is isolative to his room. He has been coming out for meals and denies any change in appetite. He denies any insomnia. He denies any somatic complaints and is tolerating psychotropic medications fairly well.  02/15/17 the patient remains paranoid and delusional. He was argumentative with this Probation officer regarding his primary psychiatrist. He has been fairly isolative to his room and at times appears to be responding to internal stimuli. The patient has been compliant with medications but does not interact well in groups or participate with peers for programming on the unit. Well last night and appetite is somatic complaints. He refused her vitals.   Treatment plan. The patient refuses any medications, including antihypertensive, but agrees to take Trilafon 16 mg tid for psychosis. He also takes Zyprexa 30 mg  nightly with encouragement. In the past four hospitalizations, he always improved on Trilafon but stops taking it as soon as discharged. We realize that he is on tremendous, even dangerous doses of antipsychotics.  Social/disposition. He is disabled from mental illness and has a history of lengthy incarceration for assault on health care professional. His ACT team has enormous difficulties providing care. He is his own guardian. He would benefit from structured environment of a group home but placement is impossible. He has two care coordinators through his [facility]. During last hospitalization we attempted to obtain funding for "living high" but were refused.  Past psychiatric history. 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 There were numerous hospitalizations including extended stays at state hospitals. He responded to Clozapine but refuses to take it due to sedation.  Family psychiatric history. Reports multiple family members with mental illness.  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 Schizoaffective disorder, bipolar type ([place]) [F25.0] 02/12/2017  . HTN (hypertension) [I10] 10/21/2016  . Undifferentiated schizophrenia ([place]) [F20.3] 10/12/2016  . Tobacco use disorder [F17.200] 02/05/2016  . Noncompliance [Z91.19] 01/23/2016   Total Time spent with patient: 20 minutes  Past Medical History:  Past Medical History:  Diagnosis Date  . Anxiety   . Hypercholesteremia   . Hypertension   . Schizophrenia [facility])     Past Surgical History:  Procedure Laterality Date  . CIRCUMCISION, NON-NEWBORN     Family History: History reviewed. No pertinent family history.  Social History:  History  Alcohol Use  . Yes    Comment: social drinker  History  Drug Use No    Social History   Social History  . Marital status: Legally Separated     Spouse name: N/A  . Number of children: N/A  . Years of education: N/A   Social History Main Topics  . Smoking status: Current Every Day Smoker    Packs/day: 2.00    Types: Cigarettes  . Smokeless tobacco: Never Used  . Alcohol use Yes     Comment: social drinker  . Drug use: No  . Sexual activity: Not Asked   Other Topics Concern  . None   Social History Narrative  . None   Additional Social History:                         Sleep: Fair  Appetite:  Fair  Current Medications: Current Facility-Administered Medications  Medication Dose Route Frequency Provider Last Rate Last Dose  . acetaminophen (TYLENOL) tablet 650 mg  650 mg Oral Q6H PRN [name], [name], MD      . alum & mag hydroxide-simeth (MAALOX/MYLANTA) 200-200-20 MG/5ML suspension 30 mL  30 mL Oral Q4H PRN [name], [name], MD      . atenolol (TENORMIN) tablet 25 mg  25 mg Oral Daily [name], [name], MD   25 mg at 02/15/17 0804  . magnesium hydroxide (MILK OF MAGNESIA) suspension 30 mL  30 mL Oral Daily PRN [name], [name], MD      . nicotine (NICODERM CQ - dosed in mg/24 hours) patch 21 mg  21 mg Transdermal Q0600 [name], [name], MD      . OLANZapine (ZYPREXA) tablet 30 mg  30 mg Oral QHS [name], [name], MD   30 mg at 02/14/17 2120  . OLANZapine zydis (ZYPREXA) disintegrating tablet 15 mg  15 mg Oral TID PRN [name], [name], MD      . perphenazine (TRILAFON) tablet 16 mg  16 mg Oral TID [name], [name], MD   16 mg at 02/15/17 1656  . traZODone (DESYREL) tablet 200 mg  200 mg Oral QHS [name], [name], MD        Lab Results:  No results found for this or any previous visit (from the past 48 hour(s)).  Blood Alcohol level:  Lab Results  Component Value Date   ETH <10 02/11/2017   ETH <5 86/[birth year]/1950    Metabolic Disorder Labs: Lab Results  Component Value Date   HGBA1C 5.8 (H) 11/15/2016   MPG 120 11/15/2016   MPG 120 01/27/2016   Lab  Results  Component Value Date   PROLACTIN 9.6 01/27/2016   Lab Results  Component Value Date   CHOL 256 (H) 11/15/2016   TRIG 204 (H) 11/15/2016   HDL 64 11/15/2016   CHOLHDL 4.0 11/15/2016   VLDL 41 (H) 11/15/2016   LDLCALC 151 (H) 11/15/2016   LDLCALC 139 (H) 01/27/2016     Musculoskeletal: Strength & Muscle Tone: within normal limits Gait & Station: normal Patient leans: N/A  Psychiatric Specialty Exam: Physical Exam  Nursing note and vitals reviewed. Psychiatric: His affect is angry, labile and inappropriate. His speech is rapid and/or pressured. He is hyperactive and actively hallucinating. Thought content is paranoid and delusional. Cognition and memory are impaired. He expresses impulsivity.    Review of Systems  Neurological: Negative.   Psychiatric/Behavioral: Positive for hallucinations.  All other systems reviewed and are negative.   Blood pressure (!) 151/90, pulse 74, temperature 98.4 F (36.9 C),  temperature source Oral, resp. rate 20, SpO2 100 %.There is no height or weight on file to calculate BMI.  General Appearance: Casual  Eye Contact:  Good  Speech:  Pressured  Volume:  Increased  Mood:  Angry, Dysphoric and Irritable  Affect:  Inappropriate and Labile  Thought Process:  Disorganized and Descriptions of Associations: Loose; Delusional and Paranoid  Orientation:  Full (Time, Place, and Person)  Thought Content:  Delusions, Hallucinations: Auditory and Paranoid Ideation  Suicidal Thoughts:  No  Homicidal Thoughts:  No  Memory:  Immediate;   Poor Recent;   Poor Remote;   Poor  Judgement:  Poor  Insight:  Lacking  Psychomotor Activity:  Increased and Restlessness  Concentration:  Concentration: Poor and Attention Span: Poor  Recall:  Poor  Fund of Knowledge:  Fair  Language:  Fair  Akathisia:  No  Handed:  Right  AIMS (if indicated):     Assets:  Communication Skills Desire for Improvement Financial Resources/Insurance Housing Physical  Health Resilience Social Support  ADL's:  Intact  Cognition:  Impaired,  Mild  Sleep:  Number of Hours: 7     Treatment Plan Summary: Daily contact with patient to assess and evaluate symptoms and progress in treatment and Medication management   Mr. Grassia is a 51 year old male with a history of schizophrenia admitted floridly psychotic in the context of treatment noncompliance.  1. Schizophrenia. We restarted Trilafon 16 mg tid and Zyprexa 30 mg qhs for psychosis.    2. HTN. The patient consistently refuses atenolol.  3. Metabolic syndrome monitoring. Lipid panel, TSH and HgbA1C were done in June 2018 and are normal. .   4. EKG. Normal sinus rhythm, QTc 405.  5. History of violence. There is a history of incarceration for kidnapping of Education officer, museum.   6. Social issues. The patient has two care coordinators at [facility] for outpatient and inpatient services. A letter requesting additional founding for higher level of care, under "living high" condition, was sent to [facility] but was refused.   7. Disposition. The patient will likely return to his apartment and follow up with [facility] but working with this patient has been increasingly difficult. During previous hospitalization, we attempted to find a group home to provide structured environment and improve medication compliance but no facility would accept the patient. He was not accepted at [facility] either.   Jay Schlichter, MD 02/15/2017, 5:22 PM

## 2017-02-15 NOTE — Progress Notes (Signed)
On entering the pt's room he yelled loudly " I want privacy please!" Was unable to interview the pt. At snack time the pt was in the day room. He was offered meds and took the zyprexa willingly but refused the trazodone. He was calm and quiet at that time. Remains on routine obs for safety.

## 2017-02-15 NOTE — Plan of Care (Signed)
Problem: Education: Goal: Knowledge of the prescribed therapeutic regimen will improve Outcome: Progressing Knows what medications he takes and what they are for although reluctant to take them.

## 2017-02-15 NOTE — Plan of Care (Signed)
Problem: Activity: Goal: Will verbalize the importance of balancing activity with adequate rest periods Outcome: Progressing Ambulates in the halls periodically. Paces in the room.

## 2017-02-15 NOTE — Progress Notes (Signed)
More visible on the unit tonight. Is quiet and calm. Encouraged to go to group and have a snack. Came to the med room for meds appropriately. Refused trazodone as usual. Remains on routine obs for safety. Asleep at this time.

## 2017-02-15 NOTE — [facility] Group Notes (Signed)
[facility] Group Notes:  (Nursing/MHT/Case Management/Adjunct)  Date:  02/15/2017  Time:  9:27 PM  Type of Therapy:  Group Therapy  Participation Level:  Did Not Attend  Alizzon Dioguardi A Marion Seese 02/15/2017, 9:27 PM

## 2017-02-15 NOTE — Plan of Care (Signed)
Problem: Education: Goal: Will be free of psychotic symptoms Outcome: Not Progressing Continues to have AH.  Paces in room and talks to unseen others.

## 2017-02-16 NOTE — Plan of Care (Signed)
Problem: Safety: Goal: Ability to remain free from injury will improve Outcome: Progressing Pt will remain injury free the entire shift.   

## 2017-02-16 NOTE — Progress Notes (Signed)
[facility] MD Progress Note  02/16/2017 2:50 PM Kenneth [name]  MRN:  [id number]  Subjective:  Kenneth [name] is a 51 year old male with history of schizophrenia admitted to the hospital for worsening of psychosis and agitation in the context of medication noncompliance.  02/16/2017. The patient is somewhat more subdued today. He comes to my office several times to discuss his discharge. He is taking medications in the hospital but always stops taking them following discharge. Unfortunately he developed a delusion that his outpatient psychiatrist, who is his new doctor, does not believe that the patient has mental illness and improved discontinuation of medicines. The patient thinks that this is the hemodialysis who is conspiring against him behind doctor's back. He is very paranoid and very suspicious but not threatening. He continues to talk loudly to his voices in the privacy of his room.  Treatment plan. We will continue Trilafon 16 mg 3 times daily and Zyprexa 30 mg nightly for psychosis. The patient takes medications with encouragement.  Social/disposition. The patient lives independently in an apartment. He is disabled from mental illness. He is in the care of ACT team. The patient has Coordinators through [facility]. Unfortunately so far they have not been able to help [place] with his placement especially that the patient requires high group home.  Past psychiatric history. There is a long history of mental illness including violence, multiple psychiatric hospitalizations and medication trials.  Family psychiatric history. Reports multiple family members with mental illness.  Principal Problem: Undifferentiated schizophrenia ([facility]) Diagnosis:   Patient Active Problem List   Diagnosis Date Noted  . Schizoaffective disorder, bipolar type ([facility]) [F25.0] 02/12/2017  . HTN (hypertension) [I10] 10/21/2016  . Undifferentiated schizophrenia ([facility]) [F20.3] 10/12/2016  . Tobacco use disorder [F17.200]  02/05/2016  . Noncompliance [Z91.19] 01/23/2016   Total Time spent with patient: 20 minutes  Past Medical History:  Past Medical History:  Diagnosis Date  . Anxiety   . Hypercholesteremia   . Hypertension   . Schizophrenia [facility])     Past Surgical History:  Procedure Laterality Date  . CIRCUMCISION, NON-NEWBORN     Family History: History reviewed. No pertinent family history.  Social History:  History  Alcohol Use  . Yes    Comment: social drinker     History  Drug Use No    Social History   Social History  . Marital status: Legally Separated    Spouse name: N/A  . Number of children: N/A  . Years of education: N/A   Social History Main Topics  . Smoking status: Current Every Day Smoker    Packs/day: 2.00    Types: Cigarettes  . Smokeless tobacco: Never Used  . Alcohol use Yes     Comment: social drinker  . Drug use: No  . Sexual activity: Not Asked   Other Topics Concern  . None   Social History Narrative  . None   Additional Social History:                         Sleep: Fair  Appetite:  Fair  Current Medications: Current Facility-Administered Medications  Medication Dose Route Frequency Provider Last Rate Last Dose  . acetaminophen (TYLENOL) tablet 650 mg  650 mg Oral Q6H PRN Crew Goren B, MD      . alum & mag hydroxide-simeth (MAALOX/MYLANTA) 200-200-20 MG/5ML suspension 30 mL  30 mL Oral Q4H PRN Nikesha Kwasny B, MD      . atenolol (TENORMIN) tablet  25 mg  25 mg Oral Daily Corianne Buccellato B, MD   25 mg at 02/16/17 0827  . magnesium hydroxide (MILK OF MAGNESIA) suspension 30 mL  30 mL Oral Daily PRN Martyn Timme B, MD      . nicotine (NICODERM CQ - dosed in mg/24 hours) patch 21 mg  21 mg Transdermal Q0600 Sunnie Odden B, MD      . OLANZapine (ZYPREXA) tablet 30 mg  30 mg Oral QHS Roselyn Doby B, MD   30 mg at 02/15/17 2058  . OLANZapine zydis (ZYPREXA) disintegrating tablet 15 mg  15 mg Oral TID  PRN Shameca Landen B, MD      . perphenazine (TRILAFON) tablet 16 mg  16 mg Oral TID Bellamie Turney B, MD   16 mg at 02/16/17 1225  . traZODone (DESYREL) tablet 200 mg  200 mg Oral QHS Lenola Lockner B, MD        Lab Results:  No results found for this or any previous visit (from the past 48 hour(s)).  Blood Alcohol level:  Lab Results  Component Value Date   ETH <10 02/11/2017   ETH <5 10/10/2016    Metabolic Disorder Labs: Lab Results  Component Value Date   HGBA1C 5.8 (H) 11/15/2016   MPG 120 11/15/2016   MPG 120 01/27/2016   Lab Results  Component Value Date   PROLACTIN 9.6 01/27/2016   Lab Results  Component Value Date   CHOL 256 (H) 11/15/2016   TRIG 204 (H) 11/15/2016   HDL 64 11/15/2016   CHOLHDL 4.0 11/15/2016   VLDL 41 (H) 11/15/2016   LDLCALC 151 (H) 11/15/2016   LDLCALC 139 (H) 01/27/2016     Musculoskeletal: Strength & Muscle Tone: within normal limits Gait & Station: normal Patient leans: N/A  Psychiatric Specialty Exam: Physical Exam  Nursing note and vitals reviewed. Psychiatric: His speech is normal. Thought content normal. His affect is blunt. He is withdrawn. Cognition and memory are normal. He expresses impulsivity.    Review of Systems  Neurological: Negative.   Psychiatric/Behavioral: Positive for hallucinations.  All other systems reviewed and are negative.   Blood pressure (!) 157/83, pulse 89, temperature 98.4 F (36.9 C), temperature source Oral, resp. rate 20, SpO2 100 %.There is no height or weight on file to calculate BMI.  General Appearance: Casual  Eye Contact:  Good  Speech:  Normal Rate  Volume:  Increased  Mood:  Irritable  Affect:  Labile  Thought Process:  Disorganized; Delusional and Paranoid  Orientation:  Full (Time, Place, and Person)  Thought Content:  Delusions, Hallucinations: Auditory and Paranoid Ideation  Suicidal Thoughts:  No  Homicidal Thoughts:  No  Memory:  Immediate;   Fair Recent;    Fair Remote;   Fair  Judgement:  Poor  Insight:  Lacking  Psychomotor Activity:  Normal  Concentration:  Concentration: Fair and Attention Span: Fair  Recall:  [employer] of Knowledge:  Fair  Language:  Fair  Akathisia:  No  Handed:  Left  AIMS (if indicated):     Assets:  Communication Skills Desire for Improvement Financial Resources/Insurance Housing Physical Health Resilience Social Support  ADL's:  Intact  Cognition:  WNL  Sleep:  Number of Hours: 6.15     Treatment Plan Summary: Daily contact with patient to assess and evaluate symptoms and progress in treatment and Medication management   Mr. Kimberlin is a 51 year old male with history of schizophrenia admitted for psychotic break in the context of  treatment noncompliance.  1. Psychosis. We restarted Trilafon 16 mg 3 times daily and Zyprexa 30 mg at bedtime for psychosis.  2. Hypertension. The patient refuses after normal.  3. Metabolic syndrome monitoring. Lipid panel, TSH, hemoglobin A1c are normal.  4 EKG. Normal sinus rhythm QTc 405.  5. History of violence. The patient has been incarcerated for 15 years after kidnapping the Child psychotherapist.   6. Disposition. He will return to his apartment and follow up with act team. The patient requires placement in a structured environment but no plans are available.   Kristine Linea, MD 02/16/2017, 2:50 PM

## 2017-02-16 NOTE — [facility] Group Notes (Signed)
[facility] Group Notes:  (Nursing/MHT/Case Management/Adjunct)  Date:  02/16/2017  Time:  2:15 PM  Type of Therapy:  Psychoeducational Skills  Participation Level:  Did Not Attend  Lynelle Smoke [facility] 02/16/2017, 2:15 PM

## 2017-02-16 NOTE — Plan of Care (Signed)
Problem: Activity: Goal: Will verbalize the importance of balancing activity with adequate rest periods Outcome: Progressing Patient is ambulating in hall and room and has appropriate foot ware on. Patient educated on fall prevention and verbalize understanding. Monitoring continues.   Problem: Education: Goal: Will be free of psychotic symptoms Outcome: Progressing Patient not noted to have any A/V/H at this time; will continue to monitor.  Goal: Knowledge of the prescribed therapeutic regimen will improve Outcome: Progressing Patient is knowledgeable about medications and voices no concerns. Monitoring continues.  Problem: Safety: Goal: Ability to redirect hostility and anger into socially appropriate behaviors will improve Outcome: Progressing Patient has been calm and cooperative this shift thus far will continue to monitor. Goal: Ability to remain free from injury will improve Outcome: Progressing Patient is free of injury this shift will continue to monitor.

## 2017-02-16 NOTE — Progress Notes (Signed)
Pt continues to be guarded with minimal interaction. The pt was in a pleasant mood this shift. He was cooperative and appropriate. No anger or aggressive behaviors noted. Pt was med compliant, no adverse affects noted. Denies SI, HI or A/V hallucinations at this time. 15 min safety checks continues.

## 2017-02-17 NOTE — Plan of Care (Signed)
Problem: Activity: Goal: Will verbalize the importance of balancing activity with adequate rest periods Outcome: Not Progressing Patient has extended periods of rest. More rest than activity.  Problem: Education: Goal: Will be free of psychotic symptoms Outcome: Not Progressing Patient continues to respond to internal stimuli. Goal: Knowledge of the prescribed therapeutic regimen will improve Outcome: Progressing Patient is knowledgeable of his therapeutic regimen.  Problem: Safety: Goal: Ability to redirect hostility and anger into socially appropriate behaviors will improve Outcome: Progressing Patient has not had any episodes of anger or hostility.

## 2017-02-17 NOTE — Progress Notes (Signed)
Patient isolative to his room, was observed laying in bed throughout the shift and is responsive to staff. Self engaged conversations initiated by patient with this RN.He appears flat in affect and avoided eye contact. Compliant with medications and meals, but does not attend group therapy settings. Denies SI/HI/AVH.

## 2017-02-17 NOTE — [facility] Group Notes (Signed)
LCSW Group Therapy Note  02/17/2017 3pm  Type of Therapy/Topic:  Group Therapy:  Feelings about Diagnosis  Participation Level:  Did Not Attend   Description of Group:   This group will allow patients to explore their thoughts and feelings about diagnoses they have received. Patients will be guided to explore their level of understanding and acceptance of these diagnoses. Facilitator will encourage patients to process their thoughts and feelings about the reactions of others to their diagnosis and will guide patients in identifying ways to discuss their diagnosis with significant others in their lives. This group will be process-oriented, with patients participating in exploration of their own experiences, giving and receiving support, and processing challenge from other group members.   Therapeutic Goals: 1. Patient will demonstrate understanding of diagnosis as evidenced by identifying two or more symptoms of the disorder 2. Patient will be able to express two feelings regarding the diagnosis 3. Patient will demonstrate their ability to communicate their needs through discussion and/or role play  Summary of Patient Progress:       Therapeutic Modalities:   Cognitive Behavioral Therapy Brief Therapy Feelings Identification    Kenneth [name] Kenneth Kemonte Ullman, LCSW 02/17/2017 4:04 PM   
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
## 2017-02-18 NOTE — [facility] Group Notes (Signed)
[facility] Group Notes:  (Nursing/MHT/Case Management/Adjunct)  Date:  02/18/2017  Time:  10:13 AM  Type of Therapy:  Psychoeducational Skills  Participation Level:  Did Not Attend  Lynelle Smoke [facility] 02/18/2017, 10:13 AM

## 2017-02-18 NOTE — [facility] Group Notes (Signed)
  [facility] LCSW Group Therapy Note  Date/Time: 02/18/17, 1300  Type of Therapy/Topic:  Group Therapy:  Emotion Regulation  Participation Level:  Did Not Attend   Mood:  Description of Group:    The purpose of this group is to assist patients in learning to regulate negative emotions and experience positive emotions. Patients will be guided to discuss ways in which they have been vulnerable to their negative emotions. These vulnerabilities will be juxtaposed with experiences of positive emotions or situations, and patients challenged to use positive emotions to combat negative ones. Special emphasis will be placed on coping with negative emotions in conflict situations, and patients will process healthy conflict resolution skills.  Therapeutic Goals: 1. Patient will identify two positive emotions or experiences to reflect on in order to balance out negative emotions:  2. Patient will label two or more emotions that they find the most difficult to experience:  3. Patient will be able to demonstrate positive conflict resolution skills through discussion or role plays:   Summary of Patient Progress:       Therapeutic Modalities:   Cognitive Behavioral Therapy Feelings Identification Dialectical Behavioral Therapy  Daleen Squibb, LCSW

## 2017-02-18 NOTE — Tx Team (Signed)
Interdisciplinary Treatment and Diagnostic Plan Update  02/18/2017 Time of Session: 1040 Kenneth [name] MRN: [id number]  Principal Diagnosis: Undifferentiated schizophrenia [facility])  Secondary Diagnoses: Principal Problem:   Undifferentiated schizophrenia (HCC) Active Problems:   Noncompliance   Tobacco use disorder   HTN (hypertension)   Current Medications:  Current Facility-Administered Medications  Medication Dose Route Frequency Provider Last Rate Last Dose  . acetaminophen (TYLENOL) tablet 650 mg  650 mg Oral Q6H PRN [name], [name], MD      . alum & mag hydroxide-simeth (MAALOX/MYLANTA) 200-200-20 MG/5ML suspension 30 mL  30 mL Oral Q4H PRN [name], [name], MD      . atenolol (TENORMIN) tablet 25 mg  25 mg Oral Daily [name], [name], MD   25 mg at 02/18/17 0824  . magnesium hydroxide (MILK OF MAGNESIA) suspension 30 mL  30 mL Oral Daily PRN [name], [name], MD      . nicotine (NICODERM CQ - dosed in mg/24 hours) patch 21 mg  21 mg Transdermal Q0600 [name], [name], MD      . OLANZapine (ZYPREXA) tablet 30 mg  30 mg Oral QHS [name], [name], MD   30 mg at 02/17/17 2134  . OLANZapine zydis (ZYPREXA) disintegrating tablet 15 mg  15 mg Oral TID PRN [name], [name], MD      . perphenazine (TRILAFON) tablet 16 mg  16 mg Oral TID [name], [name], MD   16 mg at 02/18/17 1201  . traZODone (DESYREL) tablet 200 mg  200 mg Oral QHS [name], [name], MD       PTA Medications: Prescriptions Prior to Admission  Medication Sig Dispense Refill Last Dose  . atenolol (TENORMIN) 25 MG tablet Take 0.5 tablets (12.5 mg total) by mouth daily. 30 tablet 1   . OLANZapine zydis (ZYPREXA) 15 MG disintegrating tablet Take 2 tablets (30 mg total) by mouth daily. 60 tablet 1   . perphenazine (TRILAFON) 16 MG tablet Take 1 tablet (16 mg total) by mouth 3 (three) times daily. 270 tablet 1     Patient Stressors: Health problems Medication change or  noncompliance  Patient Strengths: Capable of independent living Communication skills  Treatment Modalities: Medication Management, Group therapy, Case management,  1 to 1 session with clinician, Psychoeducation, Recreational therapy.   Physician Treatment Plan for Primary Diagnosis: Undifferentiated schizophrenia (HCC) Long Term Goal(s): Improvement in symptoms so as ready for discharge NA   Short Term Goals: Ability to identify changes in lifestyle to reduce recurrence of condition will improve Ability to verbalize feelings will improve Ability to disclose and discuss suicidal ideas Ability to demonstrate self-control will improve Ability to identify and develop effective coping behaviors will improve Compliance with prescribed medications will improve NA  Medication Management: Evaluate patient's response, side effects, and tolerance of medication regimen.  Therapeutic Interventions: 1 to 1 sessions, Unit Group sessions and Medication administration.  Evaluation of Outcomes: Progressing  Physician Treatment Plan for Secondary Diagnosis: Principal Problem:   Undifferentiated schizophrenia (HCC) Active Problems:   Noncompliance   Tobacco use disorder   HTN (hypertension)  Long Term Goal(s): Improvement in symptoms so as ready for discharge NA   Short Term Goals: Ability to identify changes in lifestyle to reduce recurrence of condition will improve Ability to verbalize feelings will improve Ability to disclose and discuss suicidal ideas Ability to demonstrate self-control will improve Ability to identify and develop effective coping behaviors will improve Compliance with prescribed medications will improve NA  Medication Management: Evaluate patient's response, side effects, and tolerance of medication regimen.  Therapeutic Interventions: 1 to 1 sessions, Unit Group sessions and Medication administration.  Evaluation of Outcomes: Progressing   RN Treatment Plan  for Primary Diagnosis: Undifferentiated schizophrenia (HCC) Long Term Goal(s): Knowledge of disease and therapeutic regimen to maintain health will improve  Short Term Goals: Ability to identify and develop effective coping behaviors will improve and Compliance with prescribed medications will improve  Medication Management: RN will administer medications as ordered by provider, will assess and evaluate patient's response and provide education to patient for prescribed medication. RN will report any adverse and/or side effects to prescribing provider.  Therapeutic Interventions: 1 on 1 counseling sessions, Psychoeducation, Medication administration, Evaluate responses to treatment, Monitor vital signs and CBGs as ordered, Perform/monitor CIWA, COWS, AIMS and Fall Risk screenings as ordered, Perform wound care treatments as ordered.  Evaluation of Outcomes: Progressing   LCSW Treatment Plan for Primary Diagnosis: Undifferentiated schizophrenia (HCC) Long Term Goal(s): Safe transition to appropriate next level of care at discharge, Engage patient in therapeutic group addressing interpersonal concerns.  Short Term Goals: Engage patient in aftercare planning with referrals and resources and Increase skills for wellness and recovery  Therapeutic Interventions: Assess for all discharge needs, 1 to 1 time with Social worker, Explore available resources and support systems, Assess for adequacy in community support network, Educate family and significant other(s) on suicide prevention, Complete Psychosocial Assessment, Interpersonal group therapy.  Evaluation of Outcomes: Progressing   Progress in Treatment: Attending groups: No. Participating in groups: No. Taking medication as prescribed: Yes. Toleration medication: Yes. Family/Significant other contact made: No, will contact:  when given permission Patient understands diagnosis: No. Discussing patient identified problems/goals with staff:  No. Medical problems stabilized or resolved: Yes. Denies suicidal/homicidal ideation: Yes. Issues/concerns per patient self-inventory: No. Other: none  New problem(s) identified: No, Describe:  none  New Short Term/Long Term Goal(s):Pt unable to articulate goal today.  Discharge Plan or Barriers: CSW assessing for appropriate plan.  Reason for Continuation of Hospitalization: Delusions  Medication stabilization  Estimated Length of Stay: 7 days.  Attendees: Patient:Kenneth [name] 02/18/2017   Physician: Dr. Jennet Maduro, MD 02/18/2017   Nursing: Horton Marshall, RN 02/18/2017   RN Care Manager: 02/18/2017   Social Worker: Daleen Squibb, Jake Shark LCSW 02/18/2017   Recreational Therapist:  02/18/2017   Other:  02/18/2017   Other:  02/18/2017   Other: 02/18/2017        Scribe for Treatment Team: Lorri Frederick, LCSW 02/18/2017 3:55 PM
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
## 2017-02-18 NOTE — Plan of Care (Signed)
Problem: Activity: Goal: Will verbalize the importance of balancing activity with adequate rest periods Outcome: Not Progressing Patient continues to rest for extended periods of time during the day  Problem: Education: Goal: Will be free of psychotic symptoms Outcome: Not Progressing Patient continue to respond to internal stimuli Goal: Knowledge of the prescribed therapeutic regimen will improve Outcome: Progressing Patient is knowledgeable of prescribed regimen.  Problem: Safety: Goal: Ability to remain free from injury will improve Outcome: Progressing Patient remains free from injury

## 2017-02-18 NOTE — Progress Notes (Signed)
D: Pt denies SI/HI/AVH, affect is blunted, mood is less irritable. Pt is pleasant and cooperative with treatment plan. Patient appears less anxious and he is interacting with peers and staff appropriately.  A: Pt was offered support and encouragement. Pt was given scheduled medications. Pt was encouraged to attend groups. Q 15 minute checks were done for safety.  R:Pt attends groups and interacts well with peers and staff. Pt is complaint with medication. Pt has no complaints.Pt receptive to treatment and safety maintained on unit, will continue to monitor.

## 2017-02-18 NOTE — Progress Notes (Signed)
Patient remains isolative to his room, was observed responding to internal stimuli this shift. Does not attend groups, but [place] compliant with medications and meals. Denies SI/HI but endorses voices saying that his Gods are talking to him.

## 2017-02-19 NOTE — Progress Notes (Signed)
Patient has is assertive and as He talks about why He is here, he starts to yell and blame everyone else, He has no insight into His issues, Patient denies voices at this time, or Si/hi, Patient has taken medications as ordered, patient with good appetite, q 15 minute checks in progress for safety.

## 2017-02-19 NOTE — Progress Notes (Signed)
Patient is alert, and has scattered thought  Process, does cooperate, and has been calm, q 15 minute checks for safety.
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
## 2017-02-19 NOTE — [facility] Group Notes (Signed)
[facility] Group Notes:  (Nursing/MHT/Case Management/Adjunct)  Date:  02/19/2017  Time:  9:46 PM  Type of Therapy:  Group Therapy  Participation Level:  Active  Participation Quality:  Appropriate  Affect:  Blunted  Cognitive:  Appropriate  Insight:  Good  Engagement in Group:  Engaged  Modes of Intervention:  Support  Summary of Progress/Problems:  Kenneth [name] 02/19/2017, 9:46 PM

## 2017-02-19 NOTE — [facility] Group Notes (Signed)
LCSW Group Therapy Note  02/19/2017 1:15pm  Type of Therapy/Topic:  Group Therapy:  Balance in Life  Participation Level:  Did Not Attend, remained in room  Description of Group:    This group will address the concept of balance and how it feels and looks when one is unbalanced. Patients will be encouraged to process areas in their lives that are out of balance and identify reasons for remaining unbalanced. Facilitators will guide patients in utilizing problem-solving interventions to address and correct the stressor making their life unbalanced. Understanding and applying boundaries will be explored and addressed for obtaining and maintaining a balanced life. Patients will be encouraged to explore ways to assertively make their unbalanced needs known to significant others in their lives, using other group members and facilitator for support and feedback.  Therapeutic Goals: 1. Patient will identify two or more emotions or situations they have that consume much of in their lives. 2. Patient will identify signs/triggers that life has become out of balance:  3. Patient will identify two ways to set boundaries in order to achieve balance in their lives:  4. Patient will demonstrate ability to communicate their needs through discussion and/or role plays  Summary of Patient Progress:      Therapeutic Modalities:   Cognitive Behavioral Therapy Solution-Focused Therapy Assertiveness Training  Jakai Risse C Jilda Kress, LCSW 02/19/2017 4:16 PM  

## 2017-02-19 NOTE — Plan of Care (Signed)
Problem: Safety: Goal: Ability to remain free from injury will improve Outcome: Progressing Pt safe on the unit at this time   

## 2017-02-20 NOTE — Progress Notes (Signed)
D: Patient has irritable mood, however, he is pleasant once he starts talking to staff.  Patient appears to be responding to internal stimuli.  He is compliant with his medications, and usually want them right away.  He refused to fill out his self inventory.  Patient denies any thoughts of self harm.  He has participated in any group activities. A: Continue to monitor medication management and MD orders.  Safety checks completed every 15 minutes per protocol.  Offer support and encouragement as needed. R: Patient remains withdrawn and isolative.

## 2017-02-20 NOTE — Progress Notes (Signed)
Kenneth [name] from [facility] ACT team visited client and believes he is at baseline.  Discussed with Dr Demetrius Charity, will plan to discharge on Monday. Garner Nash, MSW, LCSW Clinical Social Worker 02/20/2017 4:13 PM

## 2017-02-20 NOTE — [facility] Group Notes (Signed)
[facility] LCSW Group Therapy Note  Date/Time: 02/20/17, 1300  Type of Therapy and Topic:  Group Therapy:  Feelings around Relapse and Recovery  Participation Level:  Did Not Attend   Mood:  Description of Group:    Patients in this group will discuss emotions they experience before and after a relapse. They will process how experiencing these feelings, or avoidance of experiencing them, relates to having a relapse. Facilitator will guide patients to explore emotions they have related to recovery. Patients will be encouraged to process which emotions are more powerful. They will be guided to discuss the emotional reaction significant others in their lives may have to patients' relapse or recovery. Patients will be assisted in exploring ways to respond to the emotions of others without this contributing to a relapse.  Therapeutic Goals: 1. Patient will identify two or more emotions that lead to relapse for them:  2. Patient will identify two emotions that result when they relapse:  3. Patient will identify two emotions related to recovery:  4. Patient will demonstrate ability to communicate their needs through discussion and/or role plays.   Summary of Patient Progress:     Therapeutic Modalities:   Cognitive Behavioral Therapy Solution-Focused Therapy Assertiveness Training Relapse Prevention Therapy  Greg Brylinn Teaney, LCSW       

## 2017-02-20 NOTE — Plan of Care (Signed)
Problem: Safety: Goal: Ability to redirect hostility and anger into socially appropriate behaviors will improve Outcome: Progressing Patient has irritable mood, however, he has been appropriate with staff.

## 2017-02-20 NOTE — Plan of Care (Signed)
Problem: Activity: Goal: Will verbalize the importance of balancing activity with adequate rest periods Outcome: Progressing Patient was assertive with writer on approach during shift assessment.  He responded well to feedback.  Problem: Safety: Goal: Ability to redirect hostility and anger into socially appropriate behaviors will improve Outcome: Progressing Patient took Zyprexa without event this evening.  He continues to refuse Trazodone at bedtime.

## 2017-02-20 NOTE — Plan of Care (Signed)
Problem: Education: Goal: Knowledge of the prescribed therapeutic regimen will improve Outcome: Progressing Patient is aware of disease processes and treatment modalities   Problem: Safety: Goal: Ability to remain free from injury will improve Outcome: Progressing Patient is injury free and poses no harm to self and others
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
## 2017-02-20 NOTE — [facility] Group Notes (Signed)
[facility] Group Notes:  (Nursing/MHT/Case Management/Adjunct)  Date:  02/20/2017  Time:  9:32 PM  Type of Therapy:  Psychoeducational Skills  Participation Level:  Active  Participation Quality:  Appropriate  Affect:  Appropriate  Cognitive:  Appropriate  Insight:  Good  Engagement in Group:  Engaged  Modes of Intervention:  Discussion, Socialization and Support  Summary of Progress/Problems:  Kenneth [name] 02/20/2017, 9:32 PM

## 2017-02-21 NOTE — [facility] Group Notes (Signed)
[facility] Group Notes:  (Nursing/MHT/Case Management/Adjunct)  Date:  02/21/2017  Time:  9:51 PM  Type of Therapy:  Group Therapy  Participation Level:  Active  Participation Quality:  Appropriate  Affect:  Appropriate  Cognitive:  Appropriate  Insight:  Good  Engagement in Group:  Engaged  Modes of Intervention:  Support  Summary of Progress/Problems:  Mayra Neer 02/21/2017, 9:51 PM

## 2017-02-21 NOTE — Plan of Care (Signed)
Problem: Activity: Goal: Will verbalize the importance of balancing activity with adequate rest periods Outcome: Progressing Patient was over heard speaking loudly to unseen others.  He became quieter when checked on by staff.  He attended wrap up group outside and later had snack with peers.  Minimal contact with peers continues.  Problem: Education: Goal: Knowledge of the prescribed therapeutic regimen will improve Outcome: Progressing Medications reviewed with patient.
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
## 2017-02-21 NOTE — Plan of Care (Signed)
Problem: Education: Goal: Will be free of psychotic symptoms Outcome: Progressing Pleasant and cooperative.  Denies AVH although noted talking to unseen others in room.

## 2017-02-22 MED ORDER — ATENOLOL 25 MG PO TABS: 25.0000 mg | ORAL_TABLET | Freq: Every day | ORAL | 1 refills | Status: DC

## 2017-02-22 MED ORDER — TRAZODONE HCL 100 MG PO TABS: 200.0000 mg | ORAL_TABLET | Freq: Every day | ORAL | 1 refills | Status: DC

## 2017-02-22 MED ORDER — OLANZAPINE 15 MG PO TBDP: 30.0000 mg | ORAL_TABLET | Freq: Every day | ORAL | 1 refills | Status: DC

## 2017-02-22 MED ORDER — PERPHENAZINE 16 MG PO TABS: 16.0000 mg | ORAL_TABLET | Freq: Three times a day (TID) | ORAL | 1 refills | Status: DC

## 2017-02-22 NOTE — Plan of Care (Signed)
Problem: Education: Goal: Will be free of psychotic symptoms Outcome: Progressing Patient with less psychotic symptoms Goal: Knowledge of the prescribed therapeutic regimen will improve Outcome: Progressing Patient is med compliant
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
## 2017-02-22 NOTE — [facility] Group Notes (Signed)
LCSW Group Therapy Note   02/22/2017 1:15pm   Type of Therapy and Topic:  Group Therapy:  Positive Affirmations   Participation Level:  Did not attend  Description of Group: This group addressed positive affirmation toward self and others. Patients went around the room and identified two positive things about themselves and two positive things about a peer in the room. Patients reflected on how it felt to share something positive with others, to identify positive things about themselves, and to hear positive things from others. Patients were encouraged to have a daily reflection of positive characteristics or circumstances.  Therapeutic Goals 1. Patient will verbalize two of their positive qualities 2. Patient will demonstrate empathy for others by stating two positive qualities about a peer in the group 3. Patient will verbalize their feelings when voicing positive self affirmations and when voicing positive affirmations of others 4. Patients will discuss the potential positive impact on their wellness/recovery of focusing on positive traits of self and others. Summary of Patient Progress:    Therapeutic Modalities Cognitive Behavioral Therapy Motivational Interviewing  Ida Rogue, [place] 02/22/2017 2:45 PM

## 2017-02-22 NOTE — Progress Notes (Signed)
Patient up on the unit. He comes out for meds and meals. He is cooperative and pleasant with nurse. He does still have conversation with unseen others in his room. However, he denies si, hi, avh. States he is ready for discharge.

## 2017-02-22 NOTE — [facility] Group Notes (Signed)
[facility] Group Notes:  (Nursing/MHT/Case Management/Adjunct)  Date:  02/22/2017  Time:  11:21 PM  Type of Therapy:  Psychoeducational Skills  Participation Level:  Did Not Attend    Summary of Progress/Problems:  Kenneth [name] 02/22/2017, 11:21 PM

## 2017-02-23 NOTE — [facility] Suicide Risk Assessment (Signed)
[facility] Discharge Suicide Risk Assessment   Principal Problem: Undifferentiated schizophrenia [facility]) Discharge Diagnoses:  Patient Active Problem List   Diagnosis Date Noted  . Schizoaffective disorder, bipolar type (HCC) [F25.0] 02/12/2017  . HTN (hypertension) [I10] 10/21/2016  . Undifferentiated schizophrenia (HCC) [F20.3] 10/12/2016  . Tobacco use disorder [F17.200] 02/05/2016  . Noncompliance [Z91.19] 01/23/2016    Total Time spent with patient: 30 minutes  Musculoskeletal: Strength & Muscle Tone: within normal limits Gait & Station: normal Patient leans: N/A  Psychiatric Specialty Exam: Review of Systems  Neurological: Negative.   Psychiatric/Behavioral: Positive for hallucinations.  All other systems reviewed and are negative.   Blood pressure 127/81, pulse 77, temperature 98 F (36.7 C), temperature source Oral, resp. rate 18, SpO2 96 %.There is no height or weight on file to calculate BMI.  General Appearance: Casual  Eye Contact::  Good  Speech:  Clear and Coherent409  Volume:  Increased  Mood:  Euthymic  Affect:  Appropriate  Thought Process:  Goal Directed and Descriptions of Associations: Intact  Orientation:  Full (Time, Place, and Person)  Thought Content:  Delusions, Hallucinations: Auditory and Paranoid Ideation  Suicidal Thoughts:  No  Homicidal Thoughts:  No  Memory:  Immediate;   Fair Recent;   Fair Remote;   Fair  Judgement:  Poor  Insight:  Lacking  Psychomotor Activity:  Normal  Concentration:  Fair  Recall:  [employer] of Knowledge:Fair  Language: Fair  Akathisia:  No  Handed:  Right  AIMS (if indicated):     Assets:  Communication Skills Desire for Improvement Financial Resources/Insurance Housing Physical Health Resilience Social Support  Sleep:  Number of Hours: 7.3  Cognition: WNL  ADL's:  Intact   Mental Status Per Nursing Assessment::   On Admission:     Demographic Factors:  Male, Caucasian and Living alone  Loss  Factors: NA  Historical Factors: Impulsivity  Risk Reduction Factors:   Positive therapeutic relationship  Continued Clinical Symptoms:  Schizophrenia:   Paranoid or undifferentiated type  Cognitive Features That Contribute To Risk:  None    Suicide Risk:  Minimal: No identifiable suicidal ideation.  Patients presenting with no risk factors but with morbid ruminations; may be classified as minimal risk based on the severity of the depressive symptoms  Follow-up Information    Services, Psychotherapeutic Follow up on 02/23/2017.   Why:  Your ACT team will pick you up from the hospital to resume services. Contact information: [address] [place] [address] [phone]           Plan Of Care/Follow-up recommendations:  Activity:  as tolerated. Diet:  low sodium heart healthy. Other:  keep follow up appointments.  Kristine Linea, MD 02/23/2017, 9:15 AM

## 2017-02-23 NOTE — Discharge Summary (Addendum)
Physician Discharge Summary Note  Patient:  Kenneth [name] is an 51 y.o., male MRN:  [id number] DOB:  17-Sep-1965 Patient phone:  [phone] (home)  Patient address:   [address] [place] [address],  Total Time spent with patient: 30 minutes  Date of Admission:  02/11/2017 Date of Discharge: 02/23/2017  Reason for Admission:  Psychotic break.  Identifying data. Kenneth [name] is a 51 year old male with a history of schizoaffective disorder.  Chief complaint. "The doctor agreed with me but the nurse went behind his back."  History of present illness. Information was obtained from the patient and the chart. The patient was sent to the emergency room by his ACT team. He met with three member of the team, including a new psychiatrist, and told them that he has no mental illness and has not been taking medications. He was disorganized, delusional, grandiose and irritable. Kenneth [name] is well known to [place]. This is his fourth or fifth hospitalization here. He was in the hospital for over 50 days in June/July. He was discharged to his independent apartment on a combination of Trilafon and Zyprexa. It is very likely, that he stopped taking medications right away. The patient usually does well on Trilafon when compliant. The patient himself denies any symptoms of depression, anxiety, psychosis, symptoms suggestive of bipolar mania, or substance abuse. During the interview, he is intrusive, talkative, grandiose, and paranoid. He believes that his act team put him in the hospital to "get his stuff". This time he arrives with no Bible or his favorite edition of Milton's "Lemmie Evens lost". He recognizes me from previus admissions, is quite argumentative and demanding discharge.  Past psychiatric history. There is a long history of mental illness with multiple hospitalizations including extended stays at [facility]. There is a history of violent behaviors with incarceration. He has been tried on numerous  medications but likes to take Trilafon. He frequently is noncompliant with medication. He did well on Clozaril and Trileptal in the past. There were suicide attempts in the remote past.   Family psychiatric history. He reports other family members with psychiatric problems but denies having any psychiatric problems himself.  Social history. The patient lives independently in an apartment. He follows up with [facility]. He has [employer]. He apparently has a payee. He denies substance use but UDS was not done on admission.  Principal Problem: Undifferentiated schizophrenia [facility]) Discharge Diagnoses: Patient Active Problem List   Diagnosis Date Noted  . Schizoaffective disorder, bipolar type ([place]) [F25.0] 02/12/2017  . HTN (hypertension) [I10] 10/21/2016  . Undifferentiated schizophrenia ([place]) [F20.3] 10/12/2016  . Tobacco use disorder [F17.200] 02/05/2016  . Noncompliance [Z91.19] 01/23/2016     Past Medical History:  Past Medical History:  Diagnosis Date  . Anxiety   . Hypercholesteremia   . Hypertension   . Schizophrenia [facility])     Past Surgical History:  Procedure Laterality Date  . CIRCUMCISION, NON-NEWBORN     Family History: History reviewed. No pertinent family history.  Social History:  History  Alcohol Use  . Yes    Comment: social drinker     History  Drug Use No    Social History   Social History  . Marital status: Legally Separated    Spouse name: N/A  . Number of children: N/A  . Years of education: N/A   Social History Main Topics  . Smoking status: Current Every Day Smoker    Packs/day: 2.00    Types: Cigarettes  .  Smokeless tobacco: Never Used  . Alcohol use Yes     Comment: social drinker  . Drug use: No  . Sexual activity: Not Asked   Other Topics Concern  . None   Social History Narrative  . None    Hospital Course:    [name] is a 51 year old male with a history of schizophrenia admitted for psychotic break  in the context of treatment noncompliance.  1. Psychosis. We restarted Trilafon 16 mg 3 times daily and Zyprexa 30 mg at bedtime for psychosis.  2. Hypertension. The patient refuses atenolol.   3. Metabolic syndrome monitoring. Lipid panel, TSH, hemoglobin A1c are normal.  4EKG. Normal sinus rhythm QTc 405.  5. History of violence. The patient had been incarcerated for 15years after kidnapping the Education officer, museum.   6. Smoking. Nicotine patch is available.   8. Disposition. The patient was visited by his ACT team. He was discharged to his apartment. He will follow up with ACT team. We do recommend placement in a structured environment of living high group home but no funds are available. We will initiate outpatient involuntary commitment.   I certify that the services received since the previous certification/recertification were and continue to be medically necessary as the treatment provided can be reasonably expected to improve the patient's condition; the medical record documents that the services furnished were intensive treatment services or their equivalent services, and this patient continues to need, on a daily basis, active treatment furnished directly by or requiring the supervision of inpatient psychiatric personnel.   Physical Findings: AIMS:  , ,  ,  ,    CIWA:    COWS:     Musculoskeletal: Strength & Muscle Tone: within normal limits Gait & Station: normal Patient leans: N/A  Psychiatric Specialty Exam: Physical Exam  Nursing note and vitals reviewed. Psychiatric: He has a normal mood and affect. His speech is normal. He is actively hallucinating. Thought content is paranoid and delusional. Cognition and memory are normal. He expresses impulsivity.    Review of Systems  Neurological: Negative.   Psychiatric/Behavioral: Positive for hallucinations.  All other systems reviewed and are negative.   Blood pressure 127/81, pulse 77, temperature 98 F (36.7 C),  temperature source Oral, resp. rate 18, SpO2 96 %.There is no height or weight on file to calculate BMI.  General Appearance: Casual  Eye Contact:  Good  Speech:  Clear and Coherent  Volume:  Normal  Mood:  Euthymic  Affect:  Appropriate  Thought Process:  Goal Directed and Descriptions of Associations: Intact  Orientation:  NA  Thought Content:  Delusions, Hallucinations: Auditory and Paranoid Ideation  Suicidal Thoughts:  No  Homicidal Thoughts:  No  Memory:  Immediate;   Fair Recent;   Fair Remote;   Fair  Judgement:  Poor  Insight:  Lacking  Psychomotor Activity:  Normal  Concentration:  Concentration: Fair and Attention Span: Fair  Recall:  [employer] of Knowledge:  Fair  Language:  Fair  Akathisia:  No  Handed:  Right  AIMS (if indicated):     Assets:  Communication Skills Desire for Improvement Financial Resources/Insurance Housing Physical Health Resilience Social Support  ADL's:  Intact  Cognition:  WNL  Sleep:  Number of Hours: 7.3     Have you used any form of tobacco in the last 30 days? (Cigarettes, Smokeless Tobacco, Cigars, and/or Pipes): Patient Refused Screening  Has this patient used any form of tobacco in the last 30 days? (Cigarettes, Smokeless  Tobacco, Cigars, and/or Pipes) Yes, Yes, A prescription for an FDA-approved tobacco cessation medication was offered at discharge and the patient refused  Blood Alcohol level:  Lab Results  Component Value Date   [facility] <10 02/11/2017   ETH <5 29/93/7169    Metabolic Disorder Labs:  Lab Results  Component Value Date   HGBA1C 5.8 (H) 11/15/2016   MPG 120 11/15/2016   MPG 120 01/27/2016   Lab Results  Component Value Date   PROLACTIN 9.6 01/27/2016   Lab Results  Component Value Date   CHOL 256 (H) 11/15/2016   TRIG 204 (H) 11/15/2016   HDL 64 11/15/2016   CHOLHDL 4.0 11/15/2016   VLDL 41 (H) 11/15/2016   LDLCALC 151 (H) 11/15/2016   LDLCALC 139 (H) 01/27/2016    See Psychiatric Specialty  Exam and Suicide Risk Assessment completed by Attending Physician prior to discharge.  Discharge destination:  Home  Is patient on multiple antipsychotic therapies at discharge:  Yes,   Do you recommend tapering to monotherapy for antipsychotics?  No   Has Patient had three or more failed trials of antipsychotic monotherapy by history:  Yes,   Antipsychotic medications that previously failed include:   1.  clozapine., 2.  haldol. and 3.  risperdal.  Recommended Plan for Multiple Antipsychotic Therapies: Additional reason(s) for multiple antispychotic treatment:  inadequate response to a single agent.  Discharge Instructions    Diet - low sodium heart healthy    Complete by:  As directed    Increase activity slowly    Complete by:  As directed      Allergies as of 02/23/2017      Reactions   Haldol [haloperidol] Other (See Comments)   "lock up"   Loxapine    "Locks ups"   Penicillins Other (See Comments)   Neck swells Has patient had a PCN reaction causing immediate rash, facial/tongue/throat swelling, SOB or lightheadedness with hypotension: yes Has patient had a PCN reaction causing severe rash involving mucus membranes or skin necrosis: no Has patient had a PCN reaction that required hospitalization:  no Has patient had a PCN reaction occurring within the last 10 years: no If all of the above answers are "NO", then may proceed with Cephalosporin use.      Medication List    TAKE these medications     Indication  atenolol 25 MG tablet Commonly known as:  TENORMIN Take 1 tablet (25 mg total) by mouth daily. What changed:  how much to take  Indication:  High Blood Pressure Disorder   olanzapine zydis 15 MG disintegrating tablet Commonly known as:  ZYPREXA Take 2 tablets (30 mg total) by mouth daily.  Indication:  Schizophrenia   perphenazine 16 MG tablet Commonly known as:  TRILAFON Take 1 tablet (16 mg total) by mouth 3 (three) times daily.  Indication:   Schizophrenia   traZODone 100 MG tablet Commonly known as:  DESYREL Take 2 tablets (200 mg total) by mouth at bedtime.  Indication:  Trouble Sleeping      Follow-up Information    Services, Psychotherapeutic Follow up on 02/23/2017.   Why:  Your ACT team will pick you up from the hospital to resume services. Contact information: [address] [place] [address] [phone]           Follow-up recommendations:  Activity:  as tolerated. Diet:  low sodium heart healthy. Other:  keep follow up appointments.  Comments:    Signed: Orson Slick, MD  02/23/2017, 9:16 AM

## 2017-02-23 NOTE — Progress Notes (Signed)
  [facility] Adult Case Management Discharge Plan :  Will you be returning to the same living situation after discharge:  Yes,  own home At discharge, do you have transportation home?: Yes,  ACT team Do you have the ability to pay for your medications: Yes,  medicaid  Release of information consent forms completed and in the chart;  Patient's signature needed at discharge.  Patient to Follow up at: Follow-up Information    Services, Psychotherapeutic Follow up on 02/23/2017.   Why:  Your ACT team will pick you up from the hospital to resume services. Contact information: [address] [place] [address] [phone]           Next level of care provider has access to [facility] Link:no  Safety Planning and Suicide Prevention discussed: No.Pt refused.  Have you used any form of tobacco in the last 30 days? (Cigarettes, Smokeless Tobacco, Cigars, and/or Pipes): Patient Refused Screening  Has patient been referred to the Quitline?: Patient refused referral  Patient has been referred for addiction treatment: Yes  Lorri Frederick, LCSW 02/23/2017, 11:36 AM

## 2017-02-23 NOTE — Progress Notes (Signed)
D: Pt A & O X4. Denies SI, HI, AVH and pain. Presents irritable, fidgety with flat on approach. Per pt "I can't wait to leave, I'm ready to go home".  Pt rates his depression 0/10, anxiety 0/10 and hopelessness 0/10. Pt d/c home per MD's order, picked up by his ACTT team representative. A: Scheduled medications administered as prescribed with verbal education and effects monitored. D/C instructions reviewed with pt including prescriptions and follow up appointments. All belongings from locker  returned to pt  Including money $30.00, belt, keys and wallet prior to departure. Q 15 minutes safety checks maintained till time of departure without self harm gestures or outburst. R: Pt receptive to care. Verbalized understanding related to d/c instructions. Compliant with medications, denies adverse drug reactions. Ambulatory with a steady gait. Appears to be in no physical distress at this time. Safety maintained on and off unit till time of d/c.

## 2017-02-23 NOTE — Plan of Care (Signed)
Problem: Activity: Goal: Will verbalize the importance of balancing activity with adequate rest periods Outcome: Progressing Patient spent most of this evening in his room.  He could be heard talking to unseen others.  He reported to Clinical research associate, "I will be discharged tomorrow at 4 am!" and had a hostile tone when given information that discharge would occur later. He refused Trazodone but took Zyprexa without event.  Problem: Safety: Goal: Ability to redirect hostility and anger into socially appropriate behaviors will improve Outcome: Progressing Patient denies SI/HI/AV/H and contracts for safety.

## 2017-02-23 NOTE — [facility] Group Notes (Signed)
[facility] LCSW Group Therapy Note  Date/Time: 02/23/17, 1300  Type of Therapy and Topic:  Group Therapy:  Overcoming Obstacles  Participation Level:  Did not attend  Description of Group:    In this group patients will be encouraged to explore what they see as obstacles to their own wellness and recovery. They will be guided to discuss their thoughts, feelings, and behaviors related to these obstacles. The group will process together ways to cope with barriers, with attention given to specific choices patients can make. Each patient will be challenged to identify changes they are motivated to make in order to overcome their obstacles. This group will be process-oriented, with patients participating in exploration of their own experiences as well as giving and receiving support and challenge from other group members.  Therapeutic Goals: 1. Patient will identify personal and current obstacles as they relate to admission. 2. Patient will identify barriers that currently interfere with their wellness or overcoming obstacles.  3. Patient will identify feelings, thought process and behaviors related to these barriers. 4. Patient will identify two changes they are willing to make to overcome these obstacles:    Summary of Patient Progress      Therapeutic Modalities:   Cognitive Behavioral Therapy Solution Focused Therapy Motivational Interviewing Relapse Prevention Therapy  Greg Zayyan Mullen, LCSW 

## 2023-02-26 ENCOUNTER — Emergency Department
Admission: EM | Admit: 2023-02-26 | Discharge: 2023-02-28 | Disposition: A | Discharge status: Home / Self Care | Payer: PRIVATE HEALTH INSURANCE | Attending: Emergency Medicine | Admitting: Emergency Medicine

## 2023-02-26 ENCOUNTER — Other Ambulatory Visit: Payer: Self-pay

## 2023-02-26 DIAGNOSIS — F4310 Post-traumatic stress disorder, unspecified: Secondary | ICD-10-CM | POA: Insufficient documentation

## 2023-02-26 DIAGNOSIS — F17210 Nicotine dependence, cigarettes, uncomplicated: Secondary | ICD-10-CM | POA: Diagnosis not present

## 2023-02-26 DIAGNOSIS — F203 Undifferentiated schizophrenia: Secondary | ICD-10-CM | POA: Diagnosis present

## 2023-02-26 DIAGNOSIS — I10 Essential (primary) hypertension: Secondary | ICD-10-CM | POA: Diagnosis not present

## 2023-02-26 DIAGNOSIS — F17200 Nicotine dependence, unspecified, uncomplicated: Secondary | ICD-10-CM | POA: Diagnosis present

## 2023-02-26 DIAGNOSIS — F419 Anxiety disorder, unspecified: Secondary | ICD-10-CM | POA: Diagnosis not present

## 2023-02-26 DIAGNOSIS — F209 Schizophrenia, unspecified: Secondary | ICD-10-CM

## 2023-02-26 LAB — ETHANOL: Alcohol, Ethyl (B): <10 mg/dL (ref <10)

## 2023-02-26 LAB — CBC
HCT: 47.2 % (ref 39.0–52.0)
Hemoglobin: 16.1 g/dL (ref 13.0–17.0)
MCH: 32.2 pg (ref 26.0–34.0)
MCHC: 34.1 g/dL (ref 30.0–36.0)
MCV: 94.4 fL (ref 80.0–100.0)
Platelets: 311 10*3/uL (ref 150–400)
RBC: 5 MIL/uL (ref 4.22–5.81)
RDW: 12.9 % (ref 11.5–15.5)
WBC: 9.6 10*3/uL (ref 4.0–10.5)
nRBC: 0 % (ref 0.0–0.2)

## 2023-02-26 LAB — COMPREHENSIVE METABOLIC PANEL
ALT: 29 U/L (ref 0–44)
AST: 30 U/L (ref 15–41)
Albumin: 4.5 g/dL (ref 3.5–5.0)
Alkaline Phosphatase: 88 U/L (ref 38–126)
Anion gap: 13 (ref 5–15)
BUN: 14 mg/dL (ref 6–20)
CO2: 26 mmol/L (ref 22–32)
Calcium: 9.6 mg/dL (ref 8.9–10.3)
Chloride: 98 mmol/L (ref 98–111)
Creatinine, Ser: 1.23 mg/dL (ref 0.61–1.24)
GFR, Estimated: >60 mL/min (ref >60)
Glucose, Bld: 102 mg/dL — ABNORMAL HIGH (ref 70–99)
Potassium: 3.5 mmol/L (ref 3.5–5.1)
Sodium: 137 mmol/L (ref 135–145)
Total Bilirubin: 0.8 mg/dL (ref 0.3–1.2)
Total Protein: 7.7 g/dL (ref 6.5–8.1)

## 2023-02-26 LAB — ACETAMINOPHEN LEVEL: Acetaminophen (Tylenol), Serum: <10 ug/mL — ABNORMAL LOW (ref 10–30)

## 2023-02-26 LAB — SALICYLATE LEVEL: Salicylate Lvl: <7 mg/dL — ABNORMAL LOW (ref 7.0–30.0)

## 2023-02-26 NOTE — Progress Notes (Addendum)
[facility] Psych ED Note  Psych team attempted to complete initial ED psychiatric evaluation/consult for Kenneth [name] at this time.  TTS reports that Kenneth [name] is declining to talk and refusing to go to the ED interview room to be evaluated.  We will reattempt assessment later.  This is a no charge note.   This document was prepared using Dragon voice recognition software and may include unintentional dictation errors.  Bishop Limbo DNP, MBA, PMHNP-BC, FNP-BC  Psychiatric Mental Health Nurse Practitioner [facility]

## 2023-02-26 NOTE — BH Assessment (Addendum)
This Clinical research associate introduced herself and attempted to engage with the patient, patient had the blanket over his head and would not remove it. This writer attempted to explain to the patient why TTS was presenting and the process of being seen by a Psyc NP, patient refused and reported "I don't want to have another episode."  When this writer asked what type of episode he was experiencing, patient would not answer. Patient would not remove his blanket and would not engage with this writer any further. This Clinical research associate communicated this to Psyc NP. Will attempt at a later time.

## 2023-02-26 NOTE — ED Triage Notes (Signed)
Pt presents via EMS c/o "PTSD, flashbacks, and anxiety". Currently denied SI/HI. Calm and cooperative in triage at this time. Currently voluntary. Denies current ETOH or drug usage.

## 2023-02-26 NOTE — ED Provider Notes (Signed)
   [facility] Provider Note    Event Date/Time   First MD Initiated Contact with Patient 02/26/23 2203     (approximate)  History   Chief Complaint: Psychiatric Evaluation  HPI  Kenneth [name] is a 57 y.o. male with a past medical history of anxiety, hypertension, schizophrenia, presents to the emergency department for worsening PTSD symptoms.  According to the patient he feels like his PTSD has been worsening recently he is also been dealing with a lot of stress and anxiety.  Patient states he came to the emergency department today to speak to a psychiatrist as he believes he would benefit from staying in inpatient psychiatric unit for several days to get back on his medications.  Patient states he has not taken medication in "quite some time."  Patient denies any SI or HI.  He has no medical complaints today.  Physical Exam   Triage Vital Signs: ED Triage Vitals  Encounter Vitals Group     BP 02/26/23 2119 (!) 169/102     Systolic BP Percentile --      Diastolic BP Percentile --      Pulse Rate 02/26/23 2119 (!) 111     Resp 02/26/23 2119 (!) 21     Temp 02/26/23 2119 98.4 F (36.9 C)     Temp src --      SpO2 02/26/23 2119 100 %     Weight --      Height --      Head Circumference --      Peak Flow --      Pain Score 02/26/23 2122 0     Pain Loc --      Pain Education --      Exclude from Growth Chart --     Most recent vital signs: Vitals:   02/26/23 2119  BP: (!) 169/102  Pulse: (!) 111  Resp: (!) 21  Temp: 98.4 F (36.9 C)  SpO2: 100%    General: Awake, no distress.  CV:  Good peripheral perfusion.  Regular rate and rhythm  Resp:  Normal effort.  Equal breath sounds bilaterally.  Abd:  No distention.  Soft, nontender.  No rebound or guarding.  ED Results / Procedures / Treatments   MEDICATIONS ORDERED IN ED: Medications - No data to display   IMPRESSION / MDM / ASSESSMENT AND PLAN / ED COURSE  I reviewed the triage vital  signs and the nursing notes.  Patient's presentation is most consistent with acute presentation with potential threat to life or bodily function.  Patient presents emergency department for worsening PTSD symptoms anxiety and stress.  Patient is requesting psychiatric evaluation and would like to be back on his medications.  Patient has no medical complaints, reassuring physical exam.  CBC is normal, chemistry is normal, remainder of the lab work is pending.  Psychiatric and TTS evaluations pending.  Patient remains here voluntarily.  FINAL CLINICAL IMPRESSION(S) / ED DIAGNOSES   PTSD Anxiety   Note:  This document was prepared using Dragon voice recognition software and may include unintentional dictation errors.   Minna Antis, MD 02/26/23 2208

## 2023-02-27 DIAGNOSIS — Z720 Tobacco use: Secondary | ICD-10-CM

## 2023-02-27 MED ORDER — OLANZAPINE 5 MG PO TABS
5.0000 mg | ORAL_TABLET | Freq: Once | ORAL | Status: AC
  Administered 2023-02-27: 5 mg via ORAL
  Filled 2023-02-27: qty 1

## 2023-02-27 MED ORDER — OLANZAPINE 5 MG PO TBDP
10.0000 mg | ORAL_TABLET | Freq: Every day | ORAL | Status: DC
  Administered 2023-02-28: 10 mg via ORAL
  Filled 2023-02-27: qty 2

## 2023-02-27 MED ORDER — PERPHENAZINE 4 MG PO TABS
16.0000 mg | ORAL_TABLET | Freq: Three times a day (TID) | ORAL | Status: DC
  Administered 2023-02-27 – 2023-02-28 (×4): 16 mg via ORAL
  Filled 2023-02-27 (×6): qty 4

## 2023-02-27 MED ORDER — OLANZAPINE 10 MG PO TBDP
30.0000 mg | ORAL_TABLET | Freq: Every day | ORAL | Status: DC
  Administered 2023-02-27: 30 mg via ORAL
  Filled 2023-02-27: qty 3

## 2023-02-27 MED ORDER — HYDROXYZINE HCL 25 MG PO TABS: 25.0000 mg | ORAL_TABLET | Freq: Three times a day (TID) | ORAL | Status: DC | PRN

## 2023-02-27 MED ORDER — ATENOLOL 25 MG PO TABS
25.0000 mg | ORAL_TABLET | Freq: Every day | ORAL | Status: DC
  Administered 2023-02-28: 25 mg via ORAL
  Filled 2023-02-27 (×2): qty 1

## 2023-02-27 MED ORDER — TRAZODONE HCL 100 MG PO TABS
200.0000 mg | ORAL_TABLET | Freq: Every day | ORAL | Status: DC
  Administered 2023-02-27: 200 mg via ORAL
  Filled 2023-02-27: qty 2

## 2023-02-27 NOTE — ED Notes (Signed)
Pt received dinner. 

## 2023-02-27 NOTE — ED Notes (Signed)
Pt/vol consult was done pt will be re-evaluated tomorrow.

## 2023-02-27 NOTE — Plan of Care (Signed)
[facility] Tonsillectomy/Adenoidectomy, Postoperative PEDS care plan entered in error.

## 2023-02-27 NOTE — Consult Note (Addendum)
[facility] ED Progress Note  02/27/2023 4:15 AM Kenneth [name]  MRN:  [id number]   Method of visit?: Virtual (Location of provider: [facility] Location of patient: [facility] ED Interview Room Names and roles of anyone participating in the consult/assessment: Bishop Limbo PMHNP, Andee Poles LCAS This service was provided via telemedicine using a 2-way, interactive audio, and video technology. )  Subjective:  PTSD Diagnosis:  Active Problems:   Tobacco use disorder   Undifferentiated schizophrenia (HCC)  Total Time spent with patient: 15 minutes  HPI:  Kenneth [name] is a 57 year old male with past psychiatric history of schizophrenia, anxiety, multiple psychiatric hospitalizations, and a history of violence when off medications. He was incarcerated for 15 years for abducting a Child psychotherapist. He presents voluntarily to [facility] ED seeking evaluation for his worsening PTSD in hopes of gaining inpatient admission for medication resumption.  Patient reports he does not currently have a psychiatrist, therapist, or ACT team. On review of chart Mr. Demore has an extensive history of medication noncompliance and apparently will only take Trilafon.  Today Mr. Edelman confirms that he has not been taking medication for years.  Mr. Gunawan reports auditory hallucinations in the form of grenades exploding and bullets flying by his ear.  Based on chart review Mr. Szeliga does not carry a diagnosis of PTSD.  During interview he reports that his PTSD is from his time fighting a "street war".  He has paranoid ideation and delusions, he declines to elaborate further when asked about the "street war" citing the telemedicine cart is recording everything to send to the courts.  Mr. Bellmore does report recently snapping at people citing that people do not understand what he is saying.  Mr. Ohanlon reports he is disabled and lives alone.  He endorses marijuana use and tobacco use.  Denies EtOH use or any  other illicit drug use.  He denies suicidal or homicidal ideation.  His affect is irritable and he articulates frustration with seeing a nurse practitioner instead of a psychiatrist tonight.   Patient declined to offer contact information to gather collateral information.  Past Psychiatric History: Schizophrenia, Anxiety,   Past Medical History:  Past Medical History:  Diagnosis Date   Anxiety    Hypercholesteremia    Hypertension    Schizophrenia (HCC)     Past Surgical History:  Procedure Laterality Date   CIRCUMCISION, NON-NEWBORN     Family History: History reviewed. No pertinent family history.  Social History:  Social History   Substance and Sexual Activity  Alcohol Use Yes   Comment: social drinker     Social History   Substance and Sexual Activity  Drug Use No    Social History   Socioeconomic History   Marital status: Legally Separated    Spouse name: Not on file   Number of children: Not on file   Years of education: Not on file   Highest education level: Not on file  Occupational History   Not on file  Tobacco Use   Smoking status: Every Day    Current packs/day: 2.00    Types: Cigarettes   Smokeless tobacco: Never  Substance and Sexual Activity   Alcohol use: Yes    Comment: social drinker   Drug use: No   Sexual activity: Not on file  Other Topics Concern   Not on file  Social History Narrative   Not on file   Social Determinants of Health   Financial Resource Strain: Not on file  Food Insecurity: Not on file  Transportation Needs: Not on file  Physical Activity: Not on file  Stress: Not on file  Social Connections: Not on file    Sleep: Good  Appetite:  Good  Current Medications: No current facility-administered medications for this encounter.   Current Outpatient Medications  Medication Sig Dispense Refill   atenolol (TENORMIN) 25 MG tablet Take 1 tablet (25 mg total) by mouth daily. 30 tablet 1   olanzapine zydis (ZYPREXA)  15 MG disintegrating tablet Take 2 tablets (30 mg total) by mouth daily. 60 tablet 1   perphenazine (TRILAFON) 16 MG tablet Take 1 tablet (16 mg total) by mouth 3 (three) times daily. 270 tablet 1   traZODone (DESYREL) 100 MG tablet Take 2 tablets (200 mg total) by mouth at bedtime. 60 tablet 1    Lab Results:  Results for orders placed or performed during the hospital encounter of 02/26/23 (from the past 48 hour(s))  Comprehensive metabolic panel     Status: Abnormal   Collection Time: 02/26/23  9:24 PM  Result Value Ref Range   Sodium 137 135 - 145 mmol/L   Potassium 3.5 3.5 - 5.1 mmol/L   Chloride 98 98 - 111 mmol/L   CO2 26 22 - 32 mmol/L   Glucose, Bld 102 (H) 70 - 99 mg/dL    Comment: Glucose reference range applies only to samples taken after fasting for at least 8 hours.   BUN 14 6 - 20 mg/dL   Creatinine, Ser 4.09 0.61 - 1.24 mg/dL   Calcium 9.6 8.9 - 81.1 mg/dL   Total Protein 7.7 6.5 - 8.1 g/dL   Albumin 4.5 3.5 - 5.0 g/dL   AST 30 15 - 41 U/L   ALT 29 0 - 44 U/L   Alkaline Phosphatase 88 38 - 126 U/L   Total Bilirubin 0.8 0.3 - 1.2 mg/dL   GFR, Estimated >91 >47 mL/min    Comment: (NOTE) Calculated using the CKD-EPI Creatinine Equation (2021)    Anion gap 13 5 - 15    Comment: Performed at [facility], [address], [place], [place] [address]  Salicylate level     Status: Abnormal   Collection Time: 02/26/23  9:24 PM  Result Value Ref Range   Salicylate Lvl <7.0 (L) 7.0 - 30.0 mg/dL    Comment: Performed at [facility], [address] [place], [place] [address]  Acetaminophen level     Status: Abnormal   Collection Time: 02/26/23  9:24 PM  Result Value Ref Range   Acetaminophen (Tylenol), Serum <10 (L) 10 - 30 ug/mL    Comment: (NOTE) Therapeutic concentrations vary significantly. A range of 10-30 ug/mL  may be an effective concentration for many patients. However, some  are best treated at concentrations outside of this  range. Acetaminophen concentrations >150 ug/mL at 4 hours after ingestion  and >50 ug/mL at 12 hours after ingestion are often associated with  toxic reactions.  Performed at [facility], [address] [place], [place] [address]   cbc     Status: None   Collection Time: 02/26/23  9:24 PM  Result Value Ref Range   WBC 9.6 4.0 - 10.5 K/uL   RBC 5.00 4.22 - 5.81 MIL/uL   Hemoglobin 16.1 13.0 - 17.0 g/dL   HCT 69.6 29.5 - 28.4 %   MCV 94.4 80.0 - 100.0 fL   MCH 32.2 26.0 - 34.0 pg   MCHC 34.1 30.0 - 36.0 g/dL  RDW 12.9 11.5 - 15.5 %   Platelets 311 150 - 400 K/uL   nRBC 0.0 0.0 - 0.2 %    Comment: Performed at [facility], [address] [place], [place] [address]  Ethanol     Status: None   Collection Time: 02/26/23  9:25 PM  Result Value Ref Range   Alcohol, Ethyl (B) <10 <10 mg/dL    Comment: (NOTE) Lowest detectable limit for serum alcohol is 10 mg/dL.  For medical purposes only. Performed at [facility], [address] [place], [place] [address]     Blood Alcohol level:  Lab Results  Component Value Date   [facility] <10 02/26/2023   ETH <10 02/11/2017    Musculoskeletal: Strength & Muscle Tone: within normal limits Gait & Station: normal Patient leans: N/A  Psychiatric Specialty Exam:  Presentation  General Appearance:  Disheveled  Eye Contact: Good  Speech: Slurred  Speech Volume: Normal  Handedness: Right   Mood and Affect  Mood: Irritable  Affect: Blunt   Thought Process  Thought Processes: Disorganized  Descriptions of Associations:No data recorded Orientation:Full (Time, Place and Person)  Thought Content:Delusions; Paranoid Ideation  History of Schizophrenia/Schizoaffective disorder:Yes  Duration of Psychotic Symptoms:Greater than six months  Hallucinations:Hallucinations: Auditory Description of Auditory Hallucinations: Hears grenades exploding or bullets flying by  Ideas of  Reference:Delusions; Paranoia  Suicidal Thoughts:Suicidal Thoughts: No  Homicidal Thoughts:Homicidal Thoughts: No   Sensorium  Memory: Immediate Fair; Recent Fair; Remote Poor  Judgment: Poor  Insight: Poor   Executive Functions  Concentration: Poor  Attention Span: Fair  Recall: Fair  Fund of Knowledge: Poor  Language: Poor   Psychomotor Activity  Psychomotor Activity:No data recorded  Assets  Assets: Desire for Improvement; Housing; Resilience   Sleep  Sleep: Sleep: Good    Physical Exam: Physical Exam Vitals and nursing note reviewed. Exam conducted with a chaperone present.  HENT:     Head: Normocephalic.  Cardiovascular:     Rate and Rhythm: Tachycardia present.  Pulmonary:     Effort: Pulmonary effort is normal.  Musculoskeletal:        General: No swelling or tenderness.  Neurological:     General: No focal deficit present.     Mental Status: He is alert.  Psychiatric:        Attention and Perception: He perceives auditory hallucinations.        Mood and Affect: Affect is blunt.        Speech: Speech is slurred.        Thought Content: Thought content is paranoid and delusional. Thought content does not include homicidal or suicidal ideation.        Cognition and Memory: Cognition is impaired. Memory is impaired.    Review of Systems  Constitutional:  Negative for chills.  Eyes:  Negative for discharge.  Respiratory:  Negative for cough and shortness of breath.   Cardiovascular:  Negative for chest pain and palpitations.  Gastrointestinal:  Negative for vomiting.  Musculoskeletal:  Negative for falls.  Neurological:  Negative for focal weakness and weakness.  Psychiatric/Behavioral:  Positive for hallucinations. Negative for suicidal ideas.    Blood pressure (!) 169/102, pulse (!) 111, temperature 98.4 F (36.9 C), resp. rate (!) 21, SpO2 100%. There is no height or weight on file to calculate BMI.  Treatment Plan  Summary: Daily contact with patient to assess and evaluate symptoms and progress in treatment and Medication management  Mr Finfrock is recommended for inpatient psychiatric admission  once medically clear.   Start Zyprexa 5 mg p.o. for psychotic symptoms of hallucinations, delusions, and paranoia.  At time of discharge in 2018 he was ordered 30 mg daily. Atarax 25 mg PO TID PRN ordered for anxiety  This visit was coded based on time. I spent a total of 35 minutes in both face-to-face and non-face-to-face activities for this visit on the date of this encounter.   This document was prepared using Dragon voice recognition software and may include unintentional dictation errors.  Bishop Limbo DNP, MBA, PMHNP-BC, FNP-BC  Psychiatric Mental Health Nurse Practitioner [facility]

## 2023-02-27 NOTE — ED Notes (Signed)
PT is resting this NT will attempt vitals and give snack when PT awaken.

## 2023-02-27 NOTE — ED Provider Notes (Signed)
Emergency Medicine Observation Re-evaluation Note  Kenneth [name] is a 57 y.o. male, seen on rounds today.  Pt initially presented to the ED for complaints of Psychiatric Evaluation  Currently, the patient is is no acute distress. Denies any concerns at this time.  Physical Exam  Blood pressure (!) 129/97, pulse 87, temperature 97.8 F (36.6 C), temperature source Oral, resp. rate 18, SpO2 96%.  Physical Exam: General: No apparent distress Pulm: Normal WOB Neuro: Moving all extremities Psych: Resting comfortably     ED Course / MDM     I have reviewed the labs performed to date as well as medications administered while in observation.  Recent changes in the last 24 hours include: No acute events overnight.  Plan   Current plan: Patient awaiting psychiatric disposition. Patient is not under full IVC at this time.    Corena Herter, MD 02/27/23 1651

## 2023-02-27 NOTE — ED Notes (Signed)
Meal provided 

## 2023-02-27 NOTE — BH Assessment (Signed)
Comprehensive Clinical Assessment (CCA) Note  02/27/2023 Kenneth [name] [id number]  Chief Complaint: Patient is a 57 year old male presenting to [facility] ED voluntarily. Per triage note Pt presents via EMS c/o "PTSD, flashbacks, and anxiety". Currently denied SI/HI. Calm and cooperative in triage at this time. Currently voluntary. Denies current ETOH or drug usage. During assessment patient appears alert and oriented x4, guarded, irritable, and paranoid with delusions. Patient would often question why he was speaking with a NP "instead of a Psychiatrist, when I used to come here I would always talk to a doctor." Patient reports issues with his PTSD symptoms "it's brining back memories of action" when asked to describe the action he becomes guarded and reports that "I can't talk to that camera it's recording me." Patient is able to report AH "grenades popping off, I hear bullets." Patient also reports that he is not currently taking his medications, he denies having a current ACT team that he used to have in the past. Patient reports some "mood swings, I've been snapping on people." Patient denies SI/HI/VH.  Per Psyc NP Bishop Limbo patient is recommended for Inpatient Chief Complaint  Patient presents with   Psychiatric Evaluation   Visit Diagnosis: Undifferentiated Schizophrenia     CCA Screening, Triage and Referral (STR)  Patient Reported Information How did you hear about [place]? Self  Referral name: No data recorded Referral phone number: No data recorded  Whom do you see for routine medical problems? No data recorded Practice/Facility Name: No data recorded Practice/Facility Phone Number: No data recorded Name of Contact: No data recorded Contact Number: No data recorded Contact Fax Number: No data recorded Prescriber Name: No data recorded Prescriber Address (if known): No data recorded  What Is the Reason for Your Visit/Call Today? Pt presents via EMS c/o "PTSD, flashbacks, and  anxiety". Currently denied SI/HI. Calm and cooperative in triage at this time. Currently voluntary. Denies current ETOH or drug usage.  How Long Has This Been Causing You Problems? > than 6 months  What Do You Feel Would Help You the Most Today? Treatment for Depression or other mood problem   Have You Recently Been in Any Inpatient Treatment (Hospital/Detox/Crisis Center/28-Day Program)? No data recorded Name/Location of Program/Hospital:No data recorded How Long Were You There? No data recorded When Were You Discharged? No data recorded  Have You Ever Received Services From [facility] Before? No data recorded Who Do You See at [facility]? No data recorded  Have You Recently Had Any Thoughts About Hurting Yourself? No  Are You Planning to Commit Suicide/Harm Yourself At This time? No   Have you Recently Had Thoughts About Hurting Someone Karolee Ohs? No  Explanation: No data recorded  Have You Used Any Alcohol or Drugs in the Past 24 Hours? No  How Long Ago Did You Use Drugs or Alcohol? No data recorded What Did You Use and How Much? No data recorded  Do You Currently Have a Therapist/Psychiatrist? No  Name of Therapist/Psychiatrist: No data recorded  Have You Been Recently Discharged From Any Office Practice or Programs? No  Explanation of Discharge From Practice/Program: No data recorded    CCA Screening Triage Referral Assessment Type of Contact: Face-to-Face  Is this Initial or Reassessment? No data recorded Date Telepsych consult ordered in [facility]:  No data recorded Time Telepsych consult ordered in [facility]:  No data recorded  Patient Reported Information Reviewed? No data recorded Patient Left Without Being Seen? No data recorded Reason for Not Completing Assessment:  No data recorded  Collateral Involvement: No data recorded  Does Patient Have a Court Appointed Legal Guardian? No data recorded Name and Contact of Legal Guardian: No data recorded If Minor and Not Living  with Parent(s), Who has Custody? No data recorded Is CPS involved or ever been involved? Never  Is APS involved or ever been involved? Never   Patient Determined To Be At Risk for Harm To Self or Others Based on Review of Patient Reported Information or Presenting Complaint? No  Method: No data recorded Availability of Means: No data recorded Intent: No data recorded Notification Required: No data recorded Additional Information for Danger to Others Potential: No data recorded Additional Comments for Danger to Others Potential: No data recorded Are There Guns or Other Weapons in Your Home? No  Types of Guns/Weapons: No data recorded Are These Weapons Safely Secured?                            No data recorded Who Could Verify You Are Able To Have These Secured: No data recorded Do You Have any Outstanding Charges, Pending Court Dates, Parole/Probation? No data recorded Contacted To Inform of Risk of Harm To Self or Others: No data recorded  Location of Assessment: [facility] ED   Does Patient Present under Involuntary Commitment? No  IVC Papers Initial File Date: No data recorded  [place] of Residence: [place]   Patient Currently Receiving the Following Services: No data recorded  Determination of Need: Emergent (2 hours)   Options For Referral: No data recorded    CCA Biopsychosocial Intake/Chief Complaint:  No data recorded Current Symptoms/Problems: No data recorded  Patient Reported Schizophrenia/Schizoaffective Diagnosis in Past: Yes   Strengths: Patient is able to communicate his needs  Preferences: No data recorded Abilities: No data recorded  Type of Services Patient Feels are Needed: No data recorded  Initial Clinical Notes/Concerns: No data recorded  Mental Health Symptoms Depression:   None   Duration of Depressive symptoms: No data recorded  Mania:   None   Anxiety:    Irritability; Restlessness; Tension; Worrying   Psychosis:    Hallucinations   Duration of Psychotic symptoms:  Greater than six months   Trauma:   Avoids reminders of event; Irritability/anger; Re-experience of traumatic event   Obsessions:   None   Compulsions:   Poor Insight   Inattention:   None   Hyperactivity/Impulsivity:   None   Oppositional/Defiant Behaviors:   Argumentative; Resentful; Spiteful; Temper   Emotional Irregularity:   Mood lability; Transient, stress-related paranoia/disassociation; Intense/inappropriate anger   Other Mood/Personality Symptoms:  No data recorded   Mental Status Exam Appearance and self-care  Stature:   Tall   Weight:   Overweight   Clothing:   Casual   Grooming:   Normal   Cosmetic use:   None   Posture/gait:   Normal   Motor activity:   Not Remarkable   Sensorium  Attention:   Normal   Concentration:   Normal   Orientation:   X5   Recall/memory:   Normal   Affect and Mood  Affect:   Appropriate   Mood:   Irritable; Negative   Relating  Eye contact:   Normal   Facial expression:   Responsive   Attitude toward examiner:   Defensive; Guarded; Irritable; Suspicious   Thought and Language  Speech flow:  Clear and Coherent   Thought content:   Appropriate to Mood and Circumstances  Preoccupation:   None   Hallucinations:   Auditory   Organization:  No data recorded  [employer] of Knowledge:   Fair   Intelligence:   Average   Abstraction:   Normal   Judgement:   Good   Reality Testing:   Adequate   Insight:   Fair   Decision Making:   Normal   Social Functioning  Social Maturity:   Responsible   Social Judgement:   Normal   Stress  Stressors:   Illness   Coping Ability:   Normal   Skill Deficits:   None   Supports:   Support needed     Religion: Religion/Spirituality Are You A Religious Person?: No  Leisure/Recreation: Leisure / Recreation Do You Have Hobbies?:  No  Exercise/Diet: Exercise/Diet Do You Exercise?: No Have You Gained or Lost A Significant Amount of Weight in the Past Six Months?: No Do You Follow a Special Diet?: No Do You Have Any Trouble Sleeping?: No   CCA Employment/Education Employment/Work Situation: Employment / Work Situation Employment Situation: Unemployed Patient's Job has Been Impacted by Current Illness: No Has Patient ever Been in Equities trader?: Yes (Describe in comment) Did You Receive Any Psychiatric Treatment/Services While in the [employer]?:  (unknown)  Education: Education Is Patient Currently Attending School?: No Did You Have An Individualized Education Program (IIEP): No Did You Have Any Difficulty At [employer]?: No   CCA Family/Childhood History Family and Relationship History: Family history Marital status: Single Does patient have children?: No  Childhood History:  Childhood History Did patient suffer any verbal/emotional/physical/sexual abuse as a child?: No Did patient suffer from severe childhood neglect?: No Has patient ever been sexually abused/assaulted/raped as an adolescent or adult?: No Was the patient ever a victim of a crime or a disaster?: No Witnessed domestic violence?: No Has patient been affected by domestic violence as an adult?: No  Child/Adolescent Assessment:     CCA Substance Use Alcohol/Drug Use: Alcohol / Drug Use Pain Medications: see mar Prescriptions: see mar Over the Counter: see mar History of alcohol / drug use?: No history of alcohol / drug abuse                         ASAM's:  Six Dimensions of Multidimensional Assessment  Dimension 1:  Acute Intoxication and/or Withdrawal Potential:      Dimension 2:  Biomedical Conditions and Complications:      Dimension 3:  Emotional, Behavioral, or Cognitive Conditions and Complications:     Dimension 4:  Readiness to Change:     Dimension 5:  Relapse, Continued use, or Continued Problem Potential:      Dimension 6:  Recovery/Living Environment:     ASAM Severity Score:    ASAM Recommended Level of Treatment:     Substance use Disorder (SUD)    Recommendations for Services/Supports/Treatments:    DSM5 Diagnoses: Patient Active Problem List   Diagnosis Date Noted   Schizoaffective disorder, bipolar type (HCC) 02/12/2017   HTN (hypertension) 10/21/2016   Undifferentiated schizophrenia (HCC) 10/12/2016   Tobacco use disorder 02/05/2016   Noncompliance 01/23/2016    Patient Centered Plan: Patient is on the following Treatment Plan(s):  Impulse Control   Referrals to Alternative Service(s): Referred to Alternative Service(s):   Place:   Date:   Time:    Referred to Alternative Service(s):   Place:   Date:   Time:    Referred to Alternative Service(s):   Place:  Date:   Time:    Referred to Alternative Service(s):   Place:   Date:   Time:      @BHCOLLABOFCARE @  [employer], LCAS-A

## 2023-02-27 NOTE — BH Assessment (Signed)
Per [facility] AC Selena Batten B), patient to be referred out of system.  Referral information for Psychiatric Hospitalization faxed to;   [facility] [phone]- [phone]) No beds available   [facility] ([phone]),   [facility] [phone]),  [address] [phone]),   [facility] [phone] -or- [phone]),   [facility] [phone])  [place] [phone] or [phone]),   [facility] [phone])  [facility] [phone] -or- [phone]) [phone]

## 2023-02-27 NOTE — ED Notes (Signed)
Moved to ED 20.  Report given to Sharyn Creamer, RN

## 2023-02-27 NOTE — Consult Note (Cosign Needed Addendum)
[facility] Face-to-Face Consult  02/27/2023 3:02 PM Kenneth [name]  MRN:  [id number]  Subjective:  PTSD Diagnosis:  Principal Problem:   Undifferentiated schizophrenia (HCC) Active Problems:   Tobacco use disorder  Total Time spent with patient: 30 minutes  Subjective: Kenneth [name] was asleep in hallway bed, easily awoken, calm & cooperative with the interview. He reports taking the bus from [place] to [place]. He hasn't lived in [place] for 5 years and claims someone else is living in his home now. Although he has family in [place], he is not connected with them. "I am feeling alright." Kenneth [name] denies anxiety/depression. He reports some flashbacks from street war and his last episode was last night. He reports occasional use of marijuana. Denies use of alcohol or other substances. Reported taking Zoloft in the past.  The client is irritable with the staff and argumentative over a juice.  He appeared clear and coherent.  Based on his past psychiatric history, medications will be started with re-evaluation tomorrow.  HPI on admission per Bishop Limbo, PMHNP:  Maxwell [name]. Luginbill is a 57 year old male with past psychiatric history of schizophrenia, anxiety, multiple psychiatric hospitalizations, and a history of violence when off medications. He was incarcerated for 15 years for abducting a Child psychotherapist. He presents voluntarily to [facility] ED seeking evaluation for his worsening PTSD in hopes of gaining inpatient admission for medication resumption.  Patient reports he does not currently have a psychiatrist, therapist, or ACT team. On review of chart [name] has an extensive history of medication noncompliance and apparently will only take Trilafon.  Today [name] confirms that he has not been taking medication for years.  [name] reports auditory hallucinations in the form of grenades exploding and bullets flying by his ear.  Based on chart review [name] does not carry a  diagnosis of PTSD.  During interview he reports that his PTSD is from his time fighting a "street war".  He has paranoid ideation and delusions, he declines to elaborate further when asked about the "street war" citing the telemedicine cart is recording everything to send to the courts.  [name] does report recently snapping at people citing that people do not understand what he is saying.  [name] reports he is disabled and lives alone.  He endorses marijuana use and tobacco use.  Denies EtOH use or any other illicit drug use.  He denies suicidal or homicidal ideation.  His affect is irritable and he articulates frustration with seeing a nurse practitioner instead of a psychiatrist tonight.   Patient declined to offer contact information to gather collateral information.  Past Psychiatric History: Schizophrenia, Anxiety,   Past Medical History:  Past Medical History:  Diagnosis Date   Anxiety    Hypercholesteremia    Hypertension    Schizophrenia (HCC)     Past Surgical History:  Procedure Laterality Date   CIRCUMCISION, NON-NEWBORN     Family History: History reviewed. No pertinent family history.  Social History:  Social History   Substance and Sexual Activity  Alcohol Use Yes   Comment: social drinker     Social History   Substance and Sexual Activity  Drug Use No    Social History   Socioeconomic History   Marital status: Legally Separated    Spouse name: Not on file   Number of children: Not on file   Years of education: Not on file   Highest education level: Not on file  Occupational History  Not on file  Tobacco Use   Smoking status: Every Day    Current packs/day: 2.00    Types: Cigarettes   Smokeless tobacco: Never  Substance and Sexual Activity   Alcohol use: Yes    Comment: social drinker   Drug use: No   Sexual activity: Not on file  Other Topics Concern   Not on file  Social History Narrative   Not on file   Social Determinants of Health    Financial Resource Strain: Not on file  Food Insecurity: Not on file  Transportation Needs: Not on file  Physical Activity: Not on file  Stress: Not on file  Social Connections: Not on file    Sleep: Good  Appetite:  Good  Current Medications: Current Facility-Administered Medications  Medication Dose Route Frequency Provider Last Rate Last Admin   atenolol (TENORMIN) tablet 25 mg  25 mg Oral Daily Charm Rings, NP       hydrOXYzine (ATARAX) tablet 25 mg  25 mg Oral TID PRN Foust, Katy L, NP       OLANZapine zydis (ZYPREXA) disintegrating tablet 30 mg  30 mg Oral Daily Charm Rings, NP   30 mg at 02/27/23 1032   perphenazine (TRILAFON) tablet 16 mg  16 mg Oral TID Charm Rings, NP   16 mg at 02/27/23 1035   traZODone (DESYREL) tablet 200 mg  200 mg Oral QHS Charm Rings, NP       Current Outpatient Medications  Medication Sig Dispense Refill   atenolol (TENORMIN) 25 MG tablet Take 1 tablet (25 mg total) by mouth daily. (Patient not taking: Reported on 02/27/2023) 30 tablet 1   olanzapine zydis (ZYPREXA) 15 MG disintegrating tablet Take 2 tablets (30 mg total) by mouth daily. (Patient not taking: Reported on 02/27/2023) 60 tablet 1   perphenazine (TRILAFON) 16 MG tablet Take 1 tablet (16 mg total) by mouth 3 (three) times daily. (Patient not taking: Reported on 02/27/2023) 270 tablet 1   traZODone (DESYREL) 100 MG tablet Take 2 tablets (200 mg total) by mouth at bedtime. (Patient not taking: Reported on 02/27/2023) 60 tablet 1    Lab Results:  Results for orders placed or performed during the hospital encounter of 02/26/23 (from the past 48 hour(s))  Comprehensive metabolic panel     Status: Abnormal   Collection Time: 02/26/23  9:24 PM  Result Value Ref Range   Sodium 137 135 - 145 mmol/L   Potassium 3.5 3.5 - 5.1 mmol/L   Chloride 98 98 - 111 mmol/L   CO2 26 22 - 32 mmol/L   Glucose, Bld 102 (H) 70 - 99 mg/dL    Comment: Glucose reference range applies only to  samples taken after fasting for at least 8 hours.   BUN 14 6 - 20 mg/dL   Creatinine, Ser 0.10 0.61 - 1.24 mg/dL   Calcium 9.6 8.9 - 27.2 mg/dL   Total Protein 7.7 6.5 - 8.1 g/dL   Albumin 4.5 3.5 - 5.0 g/dL   AST 30 15 - 41 U/L   ALT 29 0 - 44 U/L   Alkaline Phosphatase 88 38 - 126 U/L   Total Bilirubin 0.8 0.3 - 1.2 mg/dL   GFR, Estimated >53 >66 mL/min    Comment: (NOTE) Calculated using the CKD-EPI Creatinine Equation (2021)    Anion gap 13 5 - 15    Comment: Performed at [facility], [address] [place], [place] [address]  Salicylate level  Status: Abnormal   Collection Time: 02/26/23  9:24 PM  Result Value Ref Range   Salicylate Lvl <7.0 (L) 7.0 - 30.0 mg/dL    Comment: Performed at [facility], [address] [place], [place] [address]  Acetaminophen level     Status: Abnormal   Collection Time: 02/26/23  9:24 PM  Result Value Ref Range   Acetaminophen (Tylenol), Serum <10 (L) 10 - 30 ug/mL    Comment: (NOTE) Therapeutic concentrations vary significantly. A range of 10-30 ug/mL  may be an effective concentration for many patients. However, some  are best treated at concentrations outside of this range. Acetaminophen concentrations >150 ug/mL at 4 hours after ingestion  and >50 ug/mL at 12 hours after ingestion are often associated with  toxic reactions.  Performed at [facility], [address] [place], [place] [address]   cbc     Status: None   Collection Time: 02/26/23  9:24 PM  Result Value Ref Range   WBC 9.6 4.0 - 10.5 K/uL   RBC 5.00 4.22 - 5.81 MIL/uL   Hemoglobin 16.1 13.0 - 17.0 g/dL   HCT 09.8 11.9 - 14.7 %   MCV 94.4 80.0 - 100.0 fL   MCH 32.2 26.0 - 34.0 pg   MCHC 34.1 30.0 - 36.0 g/dL   RDW 82.9 56.2 - 13.0 %   Platelets 311 150 - 400 K/uL   nRBC 0.0 0.0 - 0.2 %    Comment: Performed at [facility], [address], [place], [place] [address]  Ethanol     Status: None   Collection Time: 02/26/23   9:25 PM  Result Value Ref Range   Alcohol, Ethyl (B) <10 <10 mg/dL    Comment: (NOTE) Lowest detectable limit for serum alcohol is 10 mg/dL.  For medical purposes only. Performed at [facility], [address] [place], [place] [address]     Blood Alcohol level:  Lab Results  Component Value Date   [facility] <10 02/26/2023   ETH <10 02/11/2017   Psychiatric Specialty Exam: Physical Exam Vitals and nursing note reviewed.  HENT:     Head: Normocephalic.  Pulmonary:     Effort: Pulmonary effort is normal.  Musculoskeletal:        General: No swelling or tenderness.     Cervical back: Normal range of motion.  Neurological:     General: No focal deficit present.     Mental Status: He is alert.  Psychiatric:        Mood and Affect: Affect is blunt.        Thought Content: Thought content does not include homicidal or suicidal ideation.     Review of Systems  Constitutional:  Negative for chills.  Eyes:  Negative for discharge.  Respiratory:  Negative for cough and shortness of breath.   Cardiovascular:  Negative for chest pain and palpitations.  Gastrointestinal:  Negative for vomiting.  Musculoskeletal:  Negative for falls.  Neurological:  Negative for focal weakness and weakness.  Psychiatric/Behavioral:  Positive for hallucinations. Negative for suicidal ideas.     Blood pressure (!) 129/97, pulse 87, temperature 97.8 F (36.6 C), temperature source Oral, resp. rate 18, SpO2 96%.There is no height or weight on file to calculate BMI.  General Appearance: Casual  Eye Contact:  Fair  Speech:  Normal Rate, Slurred, and poor dentition   Volume:  Normal  Mood:  Anxious  Affect:  Blunt  Thought Process:  Coherent  Orientation:  Full (Time, Place, and Person)  Thought Content:  Hallucinations: Auditory Visual  Suicidal Thoughts:  No  Homicidal Thoughts:  No  Memory:  Immediate;   Good Recent;   Good Remote;   Good  Judgement:  Good  Insight:  Good   Psychomotor Activity:  Normal  Concentration:  Concentration: Good and Attention Span: Good  Recall:  Good  Fund of Knowledge:  Fair  Language:  Good  Akathisia:  No  Handed:  Right  AIMS (if indicated):     Assets:  Communication Skills  ADL's:  Intact  Cognition:  WNL  Sleep:  Good     Physical Exam: Physical Exam Vitals and nursing note reviewed.  HENT:     Head: Normocephalic.  Pulmonary:     Effort: Pulmonary effort is normal.  Musculoskeletal:        General: No swelling or tenderness.     Cervical back: Normal range of motion.  Neurological:     General: No focal deficit present.     Mental Status: He is alert.  Psychiatric:        Mood and Affect: Affect is blunt.        Thought Content: Thought content does not include homicidal or suicidal ideation.    Review of Systems  Constitutional:  Negative for chills.  Eyes:  Negative for discharge.  Respiratory:  Negative for cough and shortness of breath.   Cardiovascular:  Negative for chest pain and palpitations.  Gastrointestinal:  Negative for vomiting.  Musculoskeletal:  Negative for falls.  Neurological:  Negative for focal weakness and weakness.  Psychiatric/Behavioral:  Positive for hallucinations. Negative for suicidal ideas.    Blood pressure (!) 129/97, pulse 87, temperature 97.8 F (36.6 C), temperature source Oral, resp. rate 18, SpO2 96%. There is no height or weight on file to calculate BMI.  Treatment Plan Summary: Daily contact with patient to assess and evaluate symptoms and progress in treatment and Medication management   Schizophrenia, undifferentiation: - Zyprexa 5 mg p.o. increased to 10 mg daily for psychotic symptoms of hallucinations, delusions, and paranoia.  At time of discharge in 2018 he was ordered 30 mg daily. - Trilafon 16 mg PO TID  Anxiety/insomnia:  - Atarax 25 mg PO TID PRN ordered for anxiety  Insomnia:  - Trazodone 200 mg PO QHS   Nanine Means, PMHNP

## 2023-02-27 NOTE — ED Notes (Signed)
Vol /psych and TTS unable to evaluate patient at this time /Will attempt @ a later time

## 2023-02-27 NOTE — ED Notes (Addendum)
Pt resting comfortably

## 2023-02-28 DIAGNOSIS — F203 Undifferentiated schizophrenia: Secondary | ICD-10-CM

## 2023-02-28 MED ORDER — OLANZAPINE 10 MG PO TBDP: 10.0000 mg | ORAL_TABLET | Freq: Every day | ORAL | 0 refills | Status: DC

## 2023-02-28 MED ORDER — TRAZODONE HCL 100 MG PO TABS: 200.0000 mg | ORAL_TABLET | Freq: Every day | ORAL | 0 refills | Status: DC

## 2023-02-28 MED ORDER — PERPHENAZINE 16 MG PO TABS: 16.0000 mg | ORAL_TABLET | Freq: Three times a day (TID) | ORAL | 0 refills | Status: DC

## 2023-02-28 NOTE — Consult Note (Signed)
[facility] Face-to-Face Consult  02/28/2023 10:01 AM GRYFFIN MAGGART  MRN:  [id number]  Subjective:  PTSD Diagnosis:  Principal Problem:   Undifferentiated schizophrenia (HCC) Active Problems:   Tobacco use disorder  Total Time spent with patient: 30 minutes  Subjective: Neel was sitting on the side of his bed when entering the room. Reports sleeping "alright", denies depression and anxiety. Denies suicidal/ homicidal ideations, hallucinations, delusions and paranoia. Clients clinical presentation supports denial of symptoms listed above.  No medication side effects. Reports appetite is good and breakfast delivered to his bedside. Client reports feeling safe to discharge today with a request for a bus pass, provided. Discussed prescriptions with client and he requests paper scripts prior to discharge, "I have insurance" without issues getting his Rx.  Explained services at [facility] with written information provided.  Pleasant and cooperative, no distress noted, goal oriented.  Denies owning guns or having access to guns or other weapons.  HPI on admission per Bishop Limbo, PMHNP:  Maxwell Caul. Binda is a 57 year old male with past psychiatric history of schizophrenia, anxiety, multiple psychiatric hospitalizations, and a history of violence when off medications. He was incarcerated for 15 years for abducting a Child psychotherapist. He presents voluntarily to [facility] ED seeking evaluation for his worsening PTSD in hopes of gaining inpatient admission for medication resumption.  Patient reports he does not currently have a psychiatrist, therapist, or ACT team. On review of chart Mr. Joncas has an extensive history of medication noncompliance and apparently will only take Trilafon.  Today Mr. Henkels confirms that he has not been taking medication for years.  Mr. Zumalt reports auditory hallucinations in the form of grenades exploding and bullets flying by his ear.  Based on chart review Mr. Deskin does  not carry a diagnosis of PTSD.  During interview he reports that his PTSD is from his time fighting a "street war".  He has paranoid ideation and delusions, he declines to elaborate further when asked about the "street war" citing the telemedicine cart is recording everything to send to the courts.  Mr. Greenlief does report recently snapping at people citing that people do not understand what he is saying.  Mr. Pelkey reports he is disabled and lives alone.  He endorses marijuana use and tobacco use.  Denies EtOH use or any other illicit drug use.  He denies suicidal or homicidal ideation.  His affect is irritable and he articulates frustration with seeing a nurse practitioner instead of a psychiatrist tonight.   Patient declined to offer contact information to gather collateral information.  Past Psychiatric History: Schizophrenia, Anxiety,   Past Medical History:  Past Medical History:  Diagnosis Date   Anxiety    Hypercholesteremia    Hypertension    Schizophrenia (HCC)     Past Surgical History:  Procedure Laterality Date   CIRCUMCISION, NON-NEWBORN     Family History: History reviewed. No pertinent family history.  Social History:  Social History   Substance and Sexual Activity  Alcohol Use Yes   Comment: social drinker     Social History   Substance and Sexual Activity  Drug Use No    Social History   Socioeconomic History   Marital status: Legally Separated    Spouse name: Not on file   Number of children: Not on file   Years of education: Not on file   Highest education level: Not on file  Occupational History   Not on file  Tobacco Use   Smoking status: Every  Day    Current packs/day: 2.00    Types: Cigarettes   Smokeless tobacco: Never  Substance and Sexual Activity   Alcohol use: Yes    Comment: social drinker   Drug use: No   Sexual activity: Not on file  Other Topics Concern   Not on file  Social History Narrative   Not on file   Social  Determinants of Health   Financial Resource Strain: Not on file  Food Insecurity: Not on file  Transportation Needs: Not on file  Physical Activity: Not on file  Stress: Not on file  Social Connections: Not on file    Sleep: Good  Appetite:  Good  Current Medications: Current Facility-Administered Medications  Medication Dose Route Frequency Provider Last Rate Last Admin   atenolol (TENORMIN) tablet 25 mg  25 mg Oral Daily Charm Rings, NP       hydrOXYzine (ATARAX) tablet 25 mg  25 mg Oral TID PRN Foust, Katy L, NP       OLANZapine zydis (ZYPREXA) disintegrating tablet 10 mg  10 mg Oral Daily Exzavier Ruderman Y, NP       perphenazine (TRILAFON) tablet 16 mg  16 mg Oral TID Charm Rings, NP   16 mg at 02/27/23 2122   traZODone (DESYREL) tablet 200 mg  200 mg Oral QHS Charm Rings, NP   200 mg at 02/27/23 2121   Current Outpatient Medications  Medication Sig Dispense Refill   atenolol (TENORMIN) 25 MG tablet Take 1 tablet (25 mg total) by mouth daily. (Patient not taking: Reported on 02/27/2023) 30 tablet 1   olanzapine zydis (ZYPREXA) 15 MG disintegrating tablet Take 2 tablets (30 mg total) by mouth daily. (Patient not taking: Reported on 02/27/2023) 60 tablet 1   perphenazine (TRILAFON) 16 MG tablet Take 1 tablet (16 mg total) by mouth 3 (three) times daily. (Patient not taking: Reported on 02/27/2023) 270 tablet 1   traZODone (DESYREL) 100 MG tablet Take 2 tablets (200 mg total) by mouth at bedtime. (Patient not taking: Reported on 02/27/2023) 60 tablet 1    Lab Results:  Results for orders placed or performed during the hospital encounter of 02/26/23 (from the past 48 hour(s))  Comprehensive metabolic panel     Status: Abnormal   Collection Time: 02/26/23  9:24 PM  Result Value Ref Range   Sodium 137 135 - 145 mmol/L   Potassium 3.5 3.5 - 5.1 mmol/L   Chloride 98 98 - 111 mmol/L   CO2 26 22 - 32 mmol/L   Glucose, Bld 102 (H) 70 - 99 mg/dL    Comment: Glucose  reference range applies only to samples taken after fasting for at least 8 hours.   BUN 14 6 - 20 mg/dL   Creatinine, Ser 1.61 0.61 - 1.24 mg/dL   Calcium 9.6 8.9 - 09.6 mg/dL   Total Protein 7.7 6.5 - 8.1 g/dL   Albumin 4.5 3.5 - 5.0 g/dL   AST 30 15 - 41 U/L   ALT 29 0 - 44 U/L   Alkaline Phosphatase 88 38 - 126 U/L   Total Bilirubin 0.8 0.3 - 1.2 mg/dL   GFR, Estimated >04 >54 mL/min    Comment: (NOTE) Calculated using the CKD-EPI Creatinine Equation (2021)    Anion gap 13 5 - 15    Comment: Performed at [facility], [address], [place], [place] [address]  Salicylate level     Status: Abnormal   Collection Time: 02/26/23  9:24 PM  Result Value Ref Range   Salicylate Lvl <7.0 (L) 7.0 - 30.0 mg/dL    Comment: Performed at [facility], [address] [place], [place] [address]  Acetaminophen level     Status: Abnormal   Collection Time: 02/26/23  9:24 PM  Result Value Ref Range   Acetaminophen (Tylenol), Serum <10 (L) 10 - 30 ug/mL    Comment: (NOTE) Therapeutic concentrations vary significantly. A range of 10-30 ug/mL  may be an effective concentration for many patients. However, some  are best treated at concentrations outside of this range. Acetaminophen concentrations >150 ug/mL at 4 hours after ingestion  and >50 ug/mL at 12 hours after ingestion are often associated with  toxic reactions.  Performed at [facility], [address] [place], [place] [address]   cbc     Status: None   Collection Time: 02/26/23  9:24 PM  Result Value Ref Range   WBC 9.6 4.0 - 10.5 K/uL   RBC 5.00 4.22 - 5.81 MIL/uL   Hemoglobin 16.1 13.0 - 17.0 g/dL   HCT 24.4 01.0 - 27.2 %   MCV 94.4 80.0 - 100.0 fL   MCH 32.2 26.0 - 34.0 pg   MCHC 34.1 30.0 - 36.0 g/dL   RDW 53.6 64.4 - 03.4 %   Platelets 311 150 - 400 K/uL   nRBC 0.0 0.0 - 0.2 %    Comment: Performed at [facility], [address], [place], [place] [address]  Ethanol     Status:  None   Collection Time: 02/26/23  9:25 PM  Result Value Ref Range   Alcohol, Ethyl (B) <10 <10 mg/dL    Comment: (NOTE) Lowest detectable limit for serum alcohol is 10 mg/dL.  For medical purposes only. Performed at [facility], [address], [place], [place] [address]     Blood Alcohol level:  Lab Results  Component Value Date   [facility] <10 02/26/2023   ETH <10 02/11/2017   Psychiatric Specialty Exam: Physical Exam Vitals and nursing note reviewed.  HENT:     Head: Normocephalic.  Pulmonary:     Effort: Pulmonary effort is normal.  Musculoskeletal:        General: No swelling or tenderness.     Cervical back: Normal range of motion.  Neurological:     General: No focal deficit present.     Mental Status: He is alert.  Psychiatric:        Mood and Affect: Mood normal.        Speech: Speech is not slurred.        Thought Content: Thought content normal.        Cognition and Memory: Cognition and memory normal.     Review of Systems  Constitutional:  Negative for chills.  Eyes:  Negative for discharge.  Respiratory:  Negative for cough and shortness of breath.   Cardiovascular:  Negative for chest pain and palpitations.  Gastrointestinal:  Negative for vomiting.  Musculoskeletal:  Negative for falls.  Neurological:  Negative for focal weakness and weakness.    Blood pressure (!) 155/97, pulse 86, temperature (!) 97.5 F (36.4 C), temperature source Oral, resp. rate 18, SpO2 97%.There is no height or weight on file to calculate BMI.  General Appearance: Fairly Groomed  Eye Contact:  Good  Speech:  Normal Rate  Volume:  Normal  Mood:  Euthymic  Affect:  Appropriate  Thought Process:  Coherent  Orientation:  Full (Time, Place,  and Person)  Thought Content:  WDL and Logical  Suicidal Thoughts:  No  Homicidal Thoughts:  No  Memory:  Immediate;   Good Recent;   Good Remote;   Good  Judgement:  Good  Insight:  Good  Psychomotor Activity:  Normal   Concentration:  Concentration: Good and Attention Span: Good  Recall:  Good  Fund of Knowledge:  Good  Language:  Good  Akathisia:  Negative  Handed:  Right  AIMS (if indicated):     Assets:  Communication Skills Desire for Improvement Resilience  ADL's:  Intact  Cognition:  WNL  Sleep:  Good    Physical Exam Vitals and nursing note reviewed.  HENT:     Head: Normocephalic.  Pulmonary:     Effort: Pulmonary effort is normal.  Musculoskeletal:        General: No swelling or tenderness.     Cervical back: Normal range of motion.  Neurological:     General: No focal deficit present.     Mental Status: He is alert.  Psychiatric:        Mood and Affect: Mood normal.        Speech: Speech is not slurred.        Thought Content: Thought content normal.        Cognition and Memory: Cognition and memory normal.    Review of Systems  Constitutional:  Negative for chills.  Eyes:  Negative for discharge.  Respiratory:  Negative for cough and shortness of breath.   Cardiovascular:  Negative for chest pain and palpitations.  Gastrointestinal:  Negative for vomiting.  Musculoskeletal:  Negative for falls.  Neurological:  Negative for focal weakness and weakness.   Blood pressure (!) 155/97, pulse 86, temperature (!) 97.5 F (36.4 C), temperature source Oral, resp. rate 18, SpO2 97%. There is no height or weight on file to calculate BMI.  Treatment Plan Summary: Daily contact with patient to assess and evaluate symptoms and progress in treatment and Plan discharge with outpatient referrals and paper scripts.   Schizophrenia, undifferentiation: - Zyprexa 5 mg p.o. increased to 10 mg daily for psychotic symptoms of hallucinations, delusions, and paranoia.  At time of discharge in 2018 he was ordered 30 mg daily. - Trilafon 16 mg PO TID  Anxiety/insomnia:  - Atarax 25 mg PO TID PRN ordered for anxiety  Insomnia:  - Trazodone 200 mg PO QHS  Discharge client as he is stable  and requested discharge, follow up information and explanations provided along with paper Rx.  Nanine Means, PMHNP

## 2023-02-28 NOTE — ED Notes (Signed)
Pt is A/Ox 4, Mr Kenneth [name] declines any SI/HI stated that he has intermittent auditory hallucinations r/t [employer], however reports this at baseline. Discharge instructions reviewed with Mr Kenneth [name] as well as staff reviewed Rx medication(s) given, Mr Kenneth [name] verbalized understanding.  All Belongings accounted for and returned to PT.  Pt left ambulatory under self care via public bus pass provided.

## 2023-02-28 NOTE — ED Notes (Addendum)
During nursing assessment, pt stated that he currently lives in a rooming house located in [place], [place].  He stated that he came here via a bus to obtain a new ID card.  Kenneth [name] also stated that when he drove by the house "I use to live in" located in [place] that strangers were residing in the home.  Pt stated that he does not have any HI or SI, he does report that he has episodes of auditory hallucinations r/t time spent in the [employer] service, however he did report this was at baseline for him.  Pt reported that he does not "deal with [facility]" when staff asked if he had.  Cont to monitor as ordered

## 2023-02-28 NOTE — ED Provider Notes (Signed)
Emergency Medicine Observation Re-evaluation Note  Kenneth [name] is a 57 y.o. male, seen on rounds today.  Pt initially presented to the ED for complaints of Psychiatric Evaluation Currently, the patient is resting.  Physical Exam  BP (!) 155/97 (BP Location: Right Arm)   Pulse 86   Temp (!) 97.5 F (36.4 C) (Oral)   Resp 18   SpO2 97%  Physical Exam Gen:  No acute distress Resp:  Breathing easily and comfortably, no accessory muscle usage Neuro:  Moving all four extremities, no gross focal neuro deficits Psych:  Resting currently, calm when awake  ED Course / MDM  EKG:   I have reviewed the labs performed to date as well as medications administered while in observation.  Recent changes in the last 24 hours include no significant changes.  Plan  Current plan is for psych disposition.    Loleta Rose, MD 02/28/23 [phone]

## 2023-02-28 NOTE — Discharge Instructions (Signed)
Please seek medical attention and help for any thoughts about wanting to harm yourself, harm others, any concerning change in behavior, severe depression, inappropriate drug use or any other new or concerning symptoms. ° °

## 2023-02-28 NOTE — ED Notes (Signed)
Vol / daily contact with patient to assess and evaluate symptoms and medication  management

## 2023-03-01 DIAGNOSIS — Z5321 Procedure and treatment not carried out due to patient leaving prior to being seen by health care provider: Secondary | ICD-10-CM | POA: Diagnosis not present

## 2023-03-01 DIAGNOSIS — F4310 Post-traumatic stress disorder, unspecified: Secondary | ICD-10-CM | POA: Diagnosis present

## 2023-03-01 DIAGNOSIS — Z5900 Homelessness unspecified: Secondary | ICD-10-CM | POA: Insufficient documentation

## 2023-03-02 ENCOUNTER — Other Ambulatory Visit: Payer: Self-pay

## 2023-03-02 ENCOUNTER — Emergency Department
Admission: EM | Admit: 2023-03-02 | Discharge: 2023-03-02 | Disposition: A | Discharge status: Home / Self Care | Payer: Medicaid - Out of State | Attending: Emergency Medicine | Admitting: Emergency Medicine

## 2023-03-02 LAB — COMPREHENSIVE METABOLIC PANEL
ALT: 29 U/L (ref 0–44)
AST: 65 U/L — ABNORMAL HIGH (ref 15–41)
Albumin: 4.3 g/dL (ref 3.5–5.0)
Alkaline Phosphatase: 85 U/L (ref 38–126)
Anion gap: 13 (ref 5–15)
BUN: 12 mg/dL (ref 6–20)
CO2: 24 mmol/L (ref 22–32)
Calcium: 9 mg/dL (ref 8.9–10.3)
Chloride: 97 mmol/L — ABNORMAL LOW (ref 98–111)
Creatinine, Ser: 1.08 mg/dL (ref 0.61–1.24)
GFR, Estimated: >60 mL/min (ref >60)
Glucose, Bld: 124 mg/dL — ABNORMAL HIGH (ref 70–99)
Potassium: 3.4 mmol/L — ABNORMAL LOW (ref 3.5–5.1)
Sodium: 134 mmol/L — ABNORMAL LOW (ref 135–145)
Total Bilirubin: 1.2 mg/dL (ref 0.3–1.2)
Total Protein: 7.6 g/dL (ref 6.5–8.1)

## 2023-03-02 LAB — CBC
HCT: 44.4 % (ref 39.0–52.0)
Hemoglobin: 15 g/dL (ref 13.0–17.0)
MCH: 31.9 pg (ref 26.0–34.0)
MCHC: 33.8 g/dL (ref 30.0–36.0)
MCV: 94.5 fL (ref 80.0–100.0)
Platelets: 314 10*3/uL (ref 150–400)
RBC: 4.7 MIL/uL (ref 4.22–5.81)
RDW: 12.7 % (ref 11.5–15.5)
WBC: 9.6 10*3/uL (ref 4.0–10.5)
nRBC: 0 % (ref 0.0–0.2)

## 2023-03-02 LAB — SALICYLATE LEVEL: Salicylate Lvl: <7 mg/dL — ABNORMAL LOW (ref 7.0–30.0)

## 2023-03-02 LAB — ETHANOL: Alcohol, Ethyl (B): <10 mg/dL (ref <10)

## 2023-03-02 LAB — ACETAMINOPHEN LEVEL: Acetaminophen (Tylenol), Serum: <10 ug/mL — ABNORMAL LOW (ref 10–30)
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
## 2023-03-02 NOTE — ED Notes (Signed)
Pt is refusing to dress out in triage. Pt then states " I am leaving. I dont have to do all of this to be seen".

## 2023-03-05 NOTE — Congregational Nurse Program (Signed)
  Dept: [phone]   Congregational Nurse Program Note  Date of Encounter: 03/05/2023 Client new to [facility] day center first RN visit today. He stated he had an appointment at [facility] on 10/25 and needed bus passes. He stated he had been to [facility] yesterday and was told to return tomorrow 10/25.  Link bus passes provided. Rn to continue to build rapport at future visits. Francesco Runner BSN, RN Past Medical History: Past Medical History:  Diagnosis Date   Anxiety    Hypercholesteremia    Hypertension    Schizophrenia [facility])     Encounter Details:  Community Questionnaire - 03/05/23 1458       Questionnaire   Ask client: Do you give verbal consent for me to treat you today? Yes    Student Assistance N/A    Location Patient Served  [facility]    Encounter Setting CN site    Population Status Unhoused    Insurance Unknown    Insurance/Financial Assistance Referral N/A    Medication N/A    Medical Provider No    Screening Referrals Made N/A    Medical Referrals Made N/A    Medical Appointment Completed N/A    CNP Interventions Advocate/Support    Screenings CN Performed N/A    ED Visit Averted N/A    Life-Saving Intervention Made N/A

## 2023-03-07 ENCOUNTER — Other Ambulatory Visit: Payer: Self-pay

## 2023-03-07 ENCOUNTER — Inpatient Hospital Stay
Admission: AD | Admit: 2023-03-07 | Discharge: 2023-03-13 | DRG: 885 | Disposition: A | Discharge status: Home / Self Care | Payer: 59 | Source: Intra-hospital | Attending: Psychiatry | Admitting: Psychiatry

## 2023-03-07 ENCOUNTER — Emergency Department
Admission: EM | Admit: 2023-03-07 | Discharge: 2023-03-07 | Disposition: A | Discharge status: Other Acute Inpatient Hospital | Payer: PRIVATE HEALTH INSURANCE | Attending: Emergency Medicine | Admitting: Emergency Medicine

## 2023-03-07 ENCOUNTER — Encounter: Payer: Self-pay | Admitting: Psychiatric/Mental Health

## 2023-03-07 DIAGNOSIS — Z5941 Food insecurity: Secondary | ICD-10-CM

## 2023-03-07 DIAGNOSIS — Z9109 Other allergy status, other than to drugs and biological substances: Secondary | ICD-10-CM

## 2023-03-07 DIAGNOSIS — Z635 Disruption of family by separation and divorce: Secondary | ICD-10-CM | POA: Diagnosis not present

## 2023-03-07 DIAGNOSIS — F32A Depression, unspecified: Secondary | ICD-10-CM | POA: Diagnosis present

## 2023-03-07 DIAGNOSIS — I10 Essential (primary) hypertension: Secondary | ICD-10-CM | POA: Diagnosis present

## 2023-03-07 DIAGNOSIS — F17210 Nicotine dependence, cigarettes, uncomplicated: Secondary | ICD-10-CM | POA: Diagnosis present

## 2023-03-07 DIAGNOSIS — F22 Delusional disorders: Secondary | ICD-10-CM

## 2023-03-07 DIAGNOSIS — Z880 Allergy status to penicillin: Secondary | ICD-10-CM

## 2023-03-07 DIAGNOSIS — F331 Major depressive disorder, recurrent, moderate: Secondary | ICD-10-CM

## 2023-03-07 DIAGNOSIS — G4700 Insomnia, unspecified: Secondary | ICD-10-CM | POA: Diagnosis present

## 2023-03-07 DIAGNOSIS — N62 Hypertrophy of breast: Secondary | ICD-10-CM | POA: Diagnosis present

## 2023-03-07 DIAGNOSIS — Z79899 Other long term (current) drug therapy: Secondary | ICD-10-CM

## 2023-03-07 DIAGNOSIS — Z888 Allergy status to other drugs, medicaments and biological substances status: Secondary | ICD-10-CM

## 2023-03-07 DIAGNOSIS — E7800 Pure hypercholesterolemia, unspecified: Secondary | ICD-10-CM | POA: Diagnosis present

## 2023-03-07 DIAGNOSIS — F203 Undifferentiated schizophrenia: Secondary | ICD-10-CM | POA: Insufficient documentation

## 2023-03-07 DIAGNOSIS — F419 Anxiety disorder, unspecified: Secondary | ICD-10-CM | POA: Diagnosis present

## 2023-03-07 DIAGNOSIS — F259 Schizoaffective disorder, unspecified: Secondary | ICD-10-CM | POA: Diagnosis present

## 2023-03-07 DIAGNOSIS — Z5902 Unsheltered homelessness: Secondary | ICD-10-CM | POA: Diagnosis not present

## 2023-03-07 DIAGNOSIS — F201 Disorganized schizophrenia: Secondary | ICD-10-CM

## 2023-03-07 DIAGNOSIS — F4310 Post-traumatic stress disorder, unspecified: Secondary | ICD-10-CM | POA: Diagnosis present

## 2023-03-07 DIAGNOSIS — F17200 Nicotine dependence, unspecified, uncomplicated: Secondary | ICD-10-CM | POA: Insufficient documentation

## 2023-03-07 LAB — URINE DRUG SCREEN, QUALITATIVE (ARMC ONLY)
Amphetamines, Ur Screen: NOT DETECTED
Barbiturates, Ur Screen: NOT DETECTED
Benzodiazepine, Ur Scrn: NOT DETECTED
Cannabinoid 50 Ng, Ur ~~LOC~~: NOT DETECTED
Cocaine Metabolite,Ur ~~LOC~~: NOT DETECTED
MDMA (Ecstasy)Ur Screen: NOT DETECTED
Methadone Scn, Ur: NOT DETECTED
Opiate, Ur Screen: NOT DETECTED
Phencyclidine (PCP) Ur S: NOT DETECTED
Tricyclic, Ur Screen: NOT DETECTED

## 2023-03-07 LAB — COMPREHENSIVE METABOLIC PANEL
ALT: 31 U/L (ref 0–44)
AST: 34 U/L (ref 15–41)
Albumin: 4.5 g/dL (ref 3.5–5.0)
Alkaline Phosphatase: 89 U/L (ref 38–126)
Anion gap: 10 (ref 5–15)
BUN: 12 mg/dL (ref 6–20)
CO2: 25 mmol/L (ref 22–32)
Calcium: 9.1 mg/dL (ref 8.9–10.3)
Chloride: 103 mmol/L (ref 98–111)
Creatinine, Ser: 1.12 mg/dL (ref 0.61–1.24)
GFR, Estimated: >60 mL/min (ref >60)
Glucose, Bld: 105 mg/dL — ABNORMAL HIGH (ref 70–99)
Potassium: 3.8 mmol/L (ref 3.5–5.1)
Sodium: 138 mmol/L (ref 135–145)
Total Bilirubin: 0.9 mg/dL (ref 0.3–1.2)
Total Protein: 7.9 g/dL (ref 6.5–8.1)

## 2023-03-07 LAB — CBC
HCT: 44.9 % (ref 39.0–52.0)
Hemoglobin: 14.8 g/dL (ref 13.0–17.0)
MCH: 32 pg (ref 26.0–34.0)
MCHC: 33 g/dL (ref 30.0–36.0)
MCV: 97.2 fL (ref 80.0–100.0)
Platelets: 335 10*3/uL (ref 150–400)
RBC: 4.62 MIL/uL (ref 4.22–5.81)
RDW: 13.2 % (ref 11.5–15.5)
WBC: 9.3 10*3/uL (ref 4.0–10.5)
nRBC: 0 % (ref 0.0–0.2)

## 2023-03-07 LAB — ACETAMINOPHEN LEVEL: Acetaminophen (Tylenol), Serum: <10 ug/mL — ABNORMAL LOW (ref 10–30)

## 2023-03-07 LAB — ETHANOL: Alcohol, Ethyl (B): <10 mg/dL (ref <10)

## 2023-03-07 LAB — SALICYLATE LEVEL: Salicylate Lvl: <7 mg/dL — ABNORMAL LOW (ref 7.0–30.0)

## 2023-03-07 MED ORDER — MAGNESIUM HYDROXIDE 400 MG/5ML PO SUSP: 30.0000 mL | Freq: Every day | ORAL | Status: DC | PRN

## 2023-03-07 MED ORDER — LORAZEPAM 2 MG/ML IJ SOLN: 2.0000 mg | Freq: Three times a day (TID) | INTRAMUSCULAR | Status: DC | PRN

## 2023-03-07 MED ORDER — DIPHENHYDRAMINE HCL 25 MG PO CAPS: 50.0000 mg | ORAL_CAPSULE | Freq: Three times a day (TID) | ORAL | Status: DC | PRN

## 2023-03-07 MED ORDER — OLANZAPINE 10 MG PO TBDP
10.0000 mg | ORAL_TABLET | Freq: Every day | ORAL | Status: DC
  Filled 2023-03-07: qty 1

## 2023-03-07 MED ORDER — ALUM & MAG HYDROXIDE-SIMETH 200-200-20 MG/5ML PO SUSP
30.0000 mL | ORAL | Status: DC | PRN
Start: 2023-03-07 — End: 2023-03-13

## 2023-03-07 MED ORDER — DIPHENHYDRAMINE HCL 50 MG/ML IJ SOLN: 50.0000 mg | Freq: Three times a day (TID) | INTRAMUSCULAR | Status: DC | PRN

## 2023-03-07 MED ORDER — LORAZEPAM 2 MG PO TABS: 2.0000 mg | ORAL_TABLET | Freq: Three times a day (TID) | ORAL | Status: DC | PRN

## 2023-03-07 MED ORDER — TRAZODONE HCL 100 MG PO TABS
200.0000 mg | ORAL_TABLET | Freq: Every day | ORAL | Status: DC
  Administered 2023-03-07 – 2023-03-11 (×3): 200 mg via ORAL
  Filled 2023-03-07 (×4): qty 2

## 2023-03-07 MED ORDER — PERPHENAZINE 4 MG PO TABS
16.0000 mg | ORAL_TABLET | Freq: Three times a day (TID) | ORAL | Status: DC
  Administered 2023-03-07 – 2023-03-13 (×17): 16 mg via ORAL
  Filled 2023-03-07 (×17): qty 4

## 2023-03-07 MED ORDER — ACETAMINOPHEN 325 MG PO TABS: 650.0000 mg | ORAL_TABLET | Freq: Four times a day (QID) | ORAL | Status: DC | PRN

## 2023-03-07 NOTE — ED Notes (Signed)
VOL/Inpatient psychiatric hospitalization

## 2023-03-07 NOTE — Group Note (Deleted)
Date:  03/07/2023 Time:  2:51 PM  Group Topic/Focus:  Self Care:   The focus of this group is to help patients understand the importance of self-care in order to improve or restore emotional, physical, spiritual, interpersonal, and financial health.     Participation Level:  {BHH PARTICIPATION ZOXWR:60454}  Participation Quality:  {BHH PARTICIPATION QUALITY:22265}  Affect:  {BHH AFFECT:22266}  Cognitive:  {BHH COGNITIVE:22267}  Insight: {BHH Insight2:20797}  Engagement in Group:  {BHH ENGAGEMENT IN UJWJX:91478}  Modes of Intervention:  {BHH MODES OF INTERVENTION:22269}  Additional Comments:  ***  Kenneth [name] 03/07/2023, 2:51 PM
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
## 2023-03-07 NOTE — ED Notes (Signed)
Patient changed out into hospital safe scrubs at this time. This RN and Ronne Binning, Charity fundraiser as witness. Patient declines to put belongings in a hospital belongings bag, instead insisting on placing them in grey duffel bag that he brought with him. Duffel bag has all of pt belongings, including removed clothes. Green hospital label placed onto patient bag. Bag and belongings placed into hospital locker.

## 2023-03-07 NOTE — Tx Team (Addendum)
Initial Treatment Plan 03/07/2023 11:56 AM Kenneth [name] ZOX:[id number]    PATIENT STRESSORS: Financial difficulties   Loss of family   Medication change or noncompliance   Traumatic event     PATIENT STRENGTHS: Ability for insight    PATIENT IDENTIFIED PROBLEMS: PTSD  Delusions                   DISCHARGE CRITERIA:  Improved stabilization in mood, thinking, and/or behavior Verbal commitment to aftercare and medication compliance  PRELIMINARY DISCHARGE PLAN: Placement in alternative living arrangements  PATIENT/FAMILY INVOLVEMENT: This treatment plan has been presented to and reviewed with the patient, Kenneth [name].  The patient has been given the opportunity to ask questions and make suggestions.  Roseanne Reno, RN 03/07/2023, 11:56 AM
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
## 2023-03-07 NOTE — Group Note (Signed)
Date:  03/07/2023 Time:  8:48 PM  Group Topic/Focus:  Music therapy    Participation Level:  Did Not Attend  Participation Quality:   none  Affect:   none  Cognitive:   none  Insight: None  Engagement in Group:   none  Modes of Intervention:   none  Additional Comments:     Raiana Pharris 03/07/2023, 8:48 PM

## 2023-03-07 NOTE — Group Note (Signed)
Date:  03/07/2023 Time:  1:47 PM  Group Topic/Focus:  Activity Group:  The focus of the group is to promote activity for the patients to encourage exercise to go out in the courtyard and get some exercise.     Participation Level:  Did Not Attend   Mary Sella Lady Wisham 03/07/2023, 1:47 PM

## 2023-03-07 NOTE — ED Triage Notes (Signed)
Pt arrives voluntary with PD. Pt scattered in his thoughts talking about his mom dying and ex wives children. Pt states he is going through "a lot." Pt denies hallucinations or drug use. Pt denies SI or HI.

## 2023-03-07 NOTE — Plan of Care (Signed)
  Problem: Education: Goal: Verbalization of understanding the information provided will improve Outcome: Progressing   Problem: Health Behavior/Discharge Planning: Goal: Identification of resources available to assist in meeting health care needs will improve Outcome: Progressing   Problem: Safety: Goal: Periods of time without injury will increase Outcome: Progressing

## 2023-03-07 NOTE — ED Notes (Signed)
While walking by pt, pt stated " you are not going to put a catheter in me neither" explained to pt no one has stated he will need a catheter currently, while walking off pt stated "stuck up bitch" advised pt disrespect to nursing staff will not be tolerated. Pt continued to mumble under his breath.
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
## 2023-03-08 DIAGNOSIS — F203 Undifferentiated schizophrenia: Secondary | ICD-10-CM | POA: Diagnosis not present

## 2023-03-08 MED ORDER — GABAPENTIN 100 MG PO CAPS
100.0000 mg | ORAL_CAPSULE | Freq: Three times a day (TID) | ORAL | Status: DC
  Administered 2023-03-09 – 2023-03-13 (×14): 100 mg via ORAL
  Filled 2023-03-08 (×14): qty 1

## 2023-03-08 MED ORDER — BENZTROPINE MESYLATE 1 MG PO TABS
1.0000 mg | ORAL_TABLET | Freq: Every day | ORAL | Status: DC
  Administered 2023-03-09 – 2023-03-13 (×5): 1 mg via ORAL
  Filled 2023-03-08 (×5): qty 1

## 2023-03-08 NOTE — Plan of Care (Signed)
D- Patient alert and oriented. Patient presented in a pleasant mood on assessment stating that he slept "good" last night because "they gave me sleeping meds". Patient denied anxiety, but endorsed depression to this Clinical research associate, stating "I'm not talking about my business". Patient denied SI, HI, AVH, and pain, stating "not right now", and that the pain is "just in my mind". Patient had no stated goals for today.  A- Some scheduled medications administered to patient, per MD orders. Support and encouragement provided. Routine safety checks conducted every 15 minutes. Patient informed to notify staff with problems or concerns.  R- No adverse drug reactions noted. Patient contracts for safety at this time. Patient compliant with some medications. Patient receptive, calm, and cooperative. Patient remains safe at this time.  Problem: Education: Goal: Knowledge of [facility] Education information/materials will improve Outcome: Not Progressing Goal: Emotional status will improve Outcome: Not Progressing Goal: Mental status will improve Outcome: Not Progressing Goal: Verbalization of understanding the information provided will improve Outcome: Not Progressing   Problem: Activity: Goal: Interest or engagement in activities will improve Outcome: Not Progressing Goal: Sleeping patterns will improve Outcome: Not Progressing   Problem: Coping: Goal: Ability to verbalize frustrations and anger appropriately will improve Outcome: Not Progressing Goal: Ability to demonstrate self-control will improve Outcome: Not Progressing   Problem: Health Behavior/Discharge Planning: Goal: Identification of resources available to assist in meeting health care needs will improve Outcome: Not Progressing Goal: Compliance with treatment plan for underlying cause of condition will improve Outcome: Not Progressing   Problem: Physical Regulation: Goal: Ability to maintain clinical measurements within normal  limits will improve Outcome: Not Progressing   Problem: Safety: Goal: Periods of time without injury will increase Outcome: Not Progressing

## 2023-03-08 NOTE — Progress Notes (Signed)
   03/08/23 1515  Provider Notification  Provider Name/Title Shaune Pollack, NP  Date Provider Notified 03/08/23  Time Provider Notified 1444  Method of Notification Page (via secure chat)  Notification Reason Red med refusal

## 2023-03-08 NOTE — H&P (Signed)
Psychiatric Admission Assessment Adult  Patient Identification: Kenneth [name] MRN:  [id number] Date of Evaluation:  03/08/2023 Chief Complaint:  Schizoaffective disorder (HCC) [F25.9] Principal Diagnosis: Undifferentiated schizophrenia (HCC) Diagnosis:  Principal Problem:   Undifferentiated schizophrenia (HCC)  History of Present Illness:  "I went to the morgue in [place] because "my family was hanging there dead".  He said his stepmother was responsible.  Irritable on assessment and provided little information outside of the basic questions.  "I told them already".  This provider saw him in the ED earlier this month when he just returned to [facility] from [place].   Medications were started and he did go to [facility] who is establishing an ACT team for him.  Prior to admission, he was living in a tent and reported today he came to the ED because he was upset over his family being dead and his stepmother being responsible.  This appears to be a fixed delusion for him.  He denies hearing voices and only sees things "when I have flashbacks" over his trauma of his family being dead.  No paranoia or mania symptoms.  Denies suicidal/homicidal ideations and substance use, UDS negative.  Sleep is "real good", appetite is "fine".  Depression is only when he thinks about his deceased family.  Anxiety "have more of that than anything else", denies panic attacks.  He does not want to be on Zyprexa due to weight gain and gynecomastia.  Marqual would like to be on his Trilafon that "works better for me".  Medications restarted and coordination of care with [facility] needed prior to discharge.    Associated Signs/Symptoms: Depression Symptoms:  depressed mood, anxiety, (Hypo) Manic Symptoms:   none Anxiety Symptoms:  Excessive Worry, Psychotic Symptoms:  Delusions, PTSD Symptoms: Had a traumatic exposure:  flashbacks at times Total Time spent with patient: 1 hour  Past Psychiatric History: schizophrenia, undifferentiated    Is the patient at risk to self? No.  Has the patient been a risk to self in the past 6 months? Yes.    Has the patient been a risk to self within the distant past? Yes.    Is the patient a risk to others? No.  Has the patient been a risk to others in the past 6 months? No.  Has the patient been a risk to others within the distant past? No.   [place] Scale:  Flowsheet Row Admission (Current) from 03/07/2023 in [facility] Most recent reading at 03/07/2023 11:30 AM ED from 03/07/2023 in [facility] Emergency Department at [facility] Most recent reading at 03/07/2023  1:27 AM ED from 03/02/2023 in [facility] Emergency Department at [facility] Most recent reading at 03/02/2023 12:15 AM  C-SSRS RISK CATEGORY No Risk No Risk No Risk        Prior Inpatient Therapy: Yes.   If yes, describe:  multiple times  Prior Outpatient Therapy: Yes.   If yes, describe [facility]   Alcohol Screening: 1. How often do you have a drink containing alcohol?: Monthly or less 2. How many drinks containing alcohol do you have on a typical day when you are drinking?: 1 or 2 3. How often do you have six or more drinks on one occasion?: Never AUDIT-C Score: 1 4. How often during the last year have you found that you were not able to stop drinking once you had started?: Never 5. How often during the last year have you failed to do what was normally expected from you because  of drinking?: Never 6. How often during the last year have you needed a first drink in the morning to get yourself going after a heavy drinking session?: Never 7. How often during the last year have you had a feeling of guilt of remorse after drinking?: Never 8. How often during the last year have you been unable to remember what happened the night before because you had been drinking?: Never 9. Have you or someone else been injured as a result of your drinking?: No 10. Has a relative or friend or a doctor or  another health worker been concerned about your drinking or suggested you cut down?: No Alcohol Use Disorder Identification Test Final Score (AUDIT): 1 Alcohol Brief Interventions/Follow-up: Patient Refused Substance Abuse History in the last 12 months:  No. Consequences of Substance Abuse: NA Previous Psychotropic Medications: No  Psychological Evaluations: Yes  Past Medical History:  Past Medical History:  Diagnosis Date   Anxiety    Hypercholesteremia    Hypertension    Schizophrenia (HCC)     Past Surgical History:  Procedure Laterality Date   CIRCUMCISION, NON-NEWBORN     Family History: History reviewed. No pertinent family history. Family Psychiatric  History: none Tobacco Screening:  Social History   Tobacco Use  Smoking Status Every Day   Current packs/day: 2.00   Types: Cigarettes  Smokeless Tobacco Never    BH Tobacco Counseling     Are you interested in Tobacco Cessation Medications?  No, patient refused Counseled patient on smoking cessation:  Refused/Declined practical counseling Reason Tobacco Screening Not Completed: No value filed.       Social History:  Social History   Substance and Sexual Activity  Alcohol Use Yes   Comment: social drinker     Social History   Substance and Sexual Activity  Drug Use No    Additional Social History:                           Allergies:   Allergies  Allergen Reactions   Garlic Other (See Comments)    Mouth blisters   Haldol [Haloperidol] Other (See Comments)    "lock up"   Loxapine     "Locks ups"   Penicillins Other (See Comments)    Neck swells Has patient had a PCN reaction causing immediate rash, facial/tongue/throat swelling, SOB or lightheadedness with hypotension: yes Has patient had a PCN reaction causing severe rash involving mucus membranes or skin necrosis: no Has patient had a PCN reaction that required hospitalization:  no Has patient had a PCN reaction occurring within the  last 10 years: no If all of the above answers are "NO", then may proceed with Cephalosporin use.    Lab Results:  Results for orders placed or performed during the hospital encounter of 03/07/23 (from the past 48 hour(s))  Comprehensive metabolic panel     Status: Abnormal   Collection Time: 03/07/23  1:33 AM  Result Value Ref Range   Sodium 138 135 - 145 mmol/L   Potassium 3.8 3.5 - 5.1 mmol/L   Chloride 103 98 - 111 mmol/L   CO2 25 22 - 32 mmol/L   Glucose, Bld 105 (H) 70 - 99 mg/dL    Comment: Glucose reference range applies only to samples taken after fasting for at least 8 hours.   BUN 12 6 - 20 mg/dL   Creatinine, Ser 1.61 0.61 - 1.24 mg/dL   Calcium 9.1 8.9 - 10.3  mg/dL   Total Protein 7.9 6.5 - 8.1 g/dL   Albumin 4.5 3.5 - 5.0 g/dL   AST 34 15 - 41 U/L   ALT 31 0 - 44 U/L   Alkaline Phosphatase 89 38 - 126 U/L   Total Bilirubin 0.9 0.3 - 1.2 mg/dL   GFR, Estimated >21 >30 mL/min    Comment: (NOTE) Calculated using the CKD-EPI Creatinine Equation (2021)    Anion gap 10 5 - 15    Comment: Performed at [facility], [address] [place], [place] [address]  Ethanol     Status: None   Collection Time: 03/07/23  1:33 AM  Result Value Ref Range   Alcohol, Ethyl (B) <10 <10 mg/dL    Comment: (NOTE) Lowest detectable limit for serum alcohol is 10 mg/dL.  For medical purposes only. Performed at [facility], [address] [place], [place] [address]   Salicylate level     Status: Abnormal   Collection Time: 03/07/23  1:33 AM  Result Value Ref Range   Salicylate Lvl <7.0 (L) 7.0 - 30.0 mg/dL    Comment: Performed at [facility], [address] [place], [place] [address]  Acetaminophen level     Status: Abnormal   Collection Time: 03/07/23  1:33 AM  Result Value Ref Range   Acetaminophen (Tylenol), Serum <10 (L) 10 - 30 ug/mL    Comment: (NOTE) Therapeutic concentrations vary significantly. A range of 10-30 ug/mL  may be an effective  concentration for many patients. However, some  are best treated at concentrations outside of this range. Acetaminophen concentrations >150 ug/mL at 4 hours after ingestion  and >50 ug/mL at 12 hours after ingestion are often associated with  toxic reactions.  Performed at [facility], [address] [place], [place] [address]   cbc     Status: None   Collection Time: 03/07/23  1:33 AM  Result Value Ref Range   WBC 9.3 4.0 - 10.5 K/uL   RBC 4.62 4.22 - 5.81 MIL/uL   Hemoglobin 14.8 13.0 - 17.0 g/dL   HCT 01.0 27.2 - 53.6 %   MCV 97.2 80.0 - 100.0 fL   MCH 32.0 26.0 - 34.0 pg   MCHC 33.0 30.0 - 36.0 g/dL   RDW 64.4 03.4 - 74.2 %   Platelets 335 150 - 400 K/uL   nRBC 0.0 0.0 - 0.2 %    Comment: Performed at [facility], [address], [place], [place] [address]  Urine Drug Screen, Qualitative     Status: None   Collection Time: 03/07/23  1:56 AM  Result Value Ref Range   Tricyclic, Ur Screen NONE DETECTED NONE DETECTED   Amphetamines, Ur Screen NONE DETECTED NONE DETECTED   MDMA (Ecstasy)Ur Screen NONE DETECTED NONE DETECTED   Cocaine Metabolite,Ur [place] NONE DETECTED NONE DETECTED   Opiate, Ur Screen NONE DETECTED NONE DETECTED   Phencyclidine (PCP) Ur S NONE DETECTED NONE DETECTED   Cannabinoid 50 Ng, Ur [place] NONE DETECTED NONE DETECTED   Barbiturates, Ur Screen NONE DETECTED NONE DETECTED   Benzodiazepine, Ur Scrn NONE DETECTED NONE DETECTED   Methadone Scn, Ur NONE DETECTED NONE DETECTED    Comment: (NOTE) Tricyclics + metabolites, urine    Cutoff 1000 ng/mL Amphetamines + metabolites, urine  Cutoff 1000 ng/mL MDMA (Ecstasy), urine              Cutoff 500 ng/mL Cocaine Metabolite, urine  Cutoff 300 ng/mL Opiate + metabolites, urine        Cutoff 300 ng/mL Phencyclidine (PCP), urine         Cutoff 25 ng/mL Cannabinoid, urine                 Cutoff 50 ng/mL Barbiturates + metabolites, urine  Cutoff 200 ng/mL Benzodiazepine, urine               Cutoff 200 ng/mL Methadone, urine                   Cutoff 300 ng/mL  The urine drug screen provides only a preliminary, unconfirmed analytical test result and should not be used for non-medical purposes. Clinical consideration and professional judgment should be applied to any positive drug screen result due to possible interfering substances. A more specific alternate chemical method must be used in order to obtain a confirmed analytical result. Gas chromatography / mass spectrometry (GC/MS) is the preferred confirm atory method. Performed at [facility], [address] [place], [place] [address]     Blood Alcohol level:  Lab Results  Component Value Date   [facility] <10 03/07/2023   ETH <10 03/02/2023    Metabolic Disorder Labs:  Lab Results  Component Value Date   HGBA1C 5.8 (H) 11/15/2016   MPG 120 11/15/2016   MPG 120 01/27/2016   Lab Results  Component Value Date   PROLACTIN 9.6 01/27/2016   Lab Results  Component Value Date   CHOL 256 (H) 11/15/2016   TRIG 204 (H) 11/15/2016   HDL 64 11/15/2016   CHOLHDL 4.0 11/15/2016   VLDL 41 (H) 11/15/2016   LDLCALC 151 (H) 11/15/2016   LDLCALC 139 (H) 01/27/2016    Current Medications: Current Facility-Administered Medications  Medication Dose Route Frequency Provider Last Rate Last Admin   acetaminophen (TYLENOL) tablet 650 mg  650 mg Oral Q6H PRN Starkes-Perry, Juel Burrow, FNP       alum & mag hydroxide-simeth (MAALOX/MYLANTA) 200-200-20 MG/5ML suspension 30 mL  30 mL Oral Q4H PRN Starkes-Perry, Juel Burrow, FNP       diphenhydrAMINE (BENADRYL) capsule 50 mg  50 mg Oral TID PRN Maryagnes Amos, FNP       Or   diphenhydrAMINE (BENADRYL) injection 50 mg  50 mg Intramuscular TID PRN Starkes-Perry, Juel Burrow, FNP       LORazepam (ATIVAN) tablet 2 mg  2 mg Oral TID PRN Maryagnes Amos, FNP       Or   LORazepam (ATIVAN) injection 2 mg  2 mg Intramuscular TID PRN Starkes-Perry, Juel Burrow, FNP       magnesium  hydroxide (MILK OF MAGNESIA) suspension 30 mL  30 mL Oral Daily PRN Starkes-Perry, Juel Burrow, FNP       OLANZapine zydis (ZYPREXA) disintegrating tablet 10 mg  10 mg Oral Daily Starkes-Perry, Juel Burrow, FNP       perphenazine (TRILAFON) tablet 16 mg  16 mg Oral TID Maryagnes Amos, FNP   16 mg at 03/07/23 1726   traZODone (DESYREL) tablet 200 mg  200 mg Oral QHS Maryagnes Amos, FNP   200 mg at 03/07/23 2148   PTA Medications: Medications Prior to Admission  Medication Sig Dispense Refill Last Dose   atenolol (TENORMIN) 25 MG tablet Take 1 tablet (25 mg total) by mouth daily. (Patient not taking: Reported on 02/27/2023) 30 tablet 1    OLANZapine zydis (ZYPREXA) 10 MG disintegrating tablet Take 1 tablet (10 mg  total) by mouth daily. 30 tablet 0    perphenazine (TRILAFON) 16 MG tablet Take 1 tablet (16 mg total) by mouth 3 (three) times daily. 90 tablet 0    traZODone (DESYREL) 100 MG tablet Take 2 tablets (200 mg total) by mouth at bedtime. 60 tablet 0     Musculoskeletal: Strength & Muscle Tone: within normal limits Gait & Station: normal Patient leans: N/A   Musculoskeletal: Strength & Muscle Tone: within normal limits Gait & Station: normal Patient leans: N/A  Psychiatric Specialty Exam: Physical Exam Vitals and nursing note reviewed.  Constitutional:      Appearance: Normal appearance.  HENT:     Head: Normocephalic.     Nose: Nose normal.  Pulmonary:     Effort: Pulmonary effort is normal.  Musculoskeletal:        General: Normal range of motion.     Cervical back: Normal range of motion.  Neurological:     General: No focal deficit present.     Mental Status: He is alert and oriented to person, place, and time.     Review of Systems  Psychiatric/Behavioral:  Positive for depression. The patient is nervous/anxious.   All other systems reviewed and are negative.   Blood pressure (!) 145/73, pulse 63, temperature (!) 96.8 F (36 C), resp. rate 19, height 5'  11" (1.803 m), weight 111.7 kg, SpO2 100%.Body mass index is 34.34 kg/m.  General Appearance: Disheveled  Eye Contact:  Fair  Speech:  slurred because of missing teeth  Volume:  Normal  Mood:  Anxious, Depressed, and Irritable  Affect:  Blunt  Thought Process:  Coherent  Orientation:  Full (Time, Place, and Person)  Thought Content:  Delusions  Suicidal Thoughts:  No  Homicidal Thoughts:  No  Memory:  Immediate;   Fair Recent;   Fair Remote;   Fair  Judgement:  Fair  Insight:  Fair  Psychomotor Activity:  Normal  Concentration:  Concentration: Good and Attention Span: Good  Recall:  [employer] of Knowledge:  Fair  Language:  Good  Akathisia:  No  Handed:  Right  AIMS (if indicated):     Assets:  Leisure Time Physical Health Resilience  ADL's:  Intact  Cognition:  WNL  Sleep:        Physical Exam: Physical Exam Vitals and nursing note reviewed.  Constitutional:      Appearance: Normal appearance.  HENT:     Head: Normocephalic.     Nose: Nose normal.  Pulmonary:     Effort: Pulmonary effort is normal.  Musculoskeletal:        General: Normal range of motion.     Cervical back: Normal range of motion.  Neurological:     General: No focal deficit present.     Mental Status: He is alert and oriented to person, place, and time.    Review of Systems  All other systems reviewed and are negative.  Blood pressure (!) 145/73, pulse 63, temperature (!) 96.8 F (36 C), resp. rate 19, height 5\' 11"  (1.803 m), weight 111.7 kg, SpO2 100%. Body mass index is 34.34 kg/m.  Treatment Plan Summary: Daily contact with patient to assess and evaluate symptoms and progress in treatment, Medication management, and Plan : Schizophrenia, undifferentiated: Trilafon 16 mg TID TSH, EKG, A1C, and lipid panel ordered   Anxiety: Gabapentin 100 mg TID   EPS: Cogentin 1 mg daily   Insomnia: Trazodone 200 mg at bedtime    Observation Level/Precautions:  15 minute checks   Laboratory:  completed in the ED, reviewed, stable  Psychotherapy:  individual and group therapy  Medications:  see above  Consultations:  none  Discharge Concerns:  none  Estimated LOS:  3-5 days  Other:     Physician Treatment Plan for Primary Diagnosis: Undifferentiated schizophrenia (HCC) Long Term Goal(s): Improvement in symptoms so as ready for discharge  Short Term Goals: Ability to identify changes in lifestyle to reduce recurrence of condition will improve, Ability to verbalize feelings will improve, Ability to disclose and discuss suicidal ideas, Ability to demonstrate self-control will improve, Ability to identify and develop effective coping behaviors will improve, Ability to maintain clinical measurements within normal limits will improve, Compliance with prescribed medications will improve, and Ability to identify triggers associated with substance abuse/mental health issues will improve  Physician Treatment Plan for Secondary Diagnosis: Principal Problem:   Undifferentiated schizophrenia (HCC)  Long Term Goal(s): Improvement in symptoms so as ready for discharge  Short Term Goals: Ability to identify changes in lifestyle to reduce recurrence of condition will improve, Ability to verbalize feelings will improve, Ability to disclose and discuss suicidal ideas, Ability to demonstrate self-control will improve, Ability to identify and develop effective coping behaviors will improve, Ability to maintain clinical measurements within normal limits will improve, Compliance with prescribed medications will improve, and Ability to identify triggers associated with substance abuse/mental health issues will improve  I certify that inpatient services furnished can reasonably be expected to improve the patient's condition.    Nanine Means, NP 10/27/202411:44 AM

## 2023-03-08 NOTE — Plan of Care (Signed)
  Problem: Coping: Goal: Ability to verbalize frustrations and anger appropriately will improve Outcome: Progressing   Problem: Education: Goal: Mental status will improve Outcome: Progressing

## 2023-03-08 NOTE — Progress Notes (Signed)
   03/07/23 2000  Psych Admission Type (Psych Patients Only)  Admission Status Voluntary  Psychosocial Assessment  Patient Complaints Anxiety  Eye Contact Fair  Facial Expression Flat  Affect Apprehensive  Speech Logical/coherent;Slurred  Interaction Assertive;Forwards little  Motor Activity Slow  Appearance/Hygiene Body odor;Disheveled  Behavior Characteristics Cooperative;Calm  Mood Pleasant  Thought Process  Coherency Circumstantial  Content Blaming others  Delusions None reported or observed  Perception Derealization  Hallucination Visual  Judgment Limited  Confusion None  Danger to Self  Current suicidal ideation? Denies  Agreement Not to Harm Self Yes  Description of Agreement verbal  Danger to Others  Danger to Others None reported or observed   Patient alert and oriented x 4, affect is blunted thoughts are organized, he denies SI/HI/AVH 15 minutes safety checks maintained will continue to monitor.

## 2023-03-08 NOTE — Group Note (Signed)
LCSW Group Therapy Note  Group Date: 03/08/2023 Start Time: 1410 End Time: 1455   Type of Therapy and Topic:  Group Therapy - Coping Skills  Participation Level:  Did Not Attend   Description of Group The focus of this group was to determine what unhealthy coping techniques typically are used by group members and what healthy coping techniques would be helpful in coping with various problems. Patients were guided in becoming aware of the differences between healthy and unhealthy coping techniques. Patients were asked to identify 2-3 healthy coping skills they would like to learn to use more effectively.  Therapeutic Goals Patients learned that coping is what human beings do all day long to deal with various situations in their lives Patients defined and discussed healthy vs unhealthy coping techniques Patients identified their preferred coping techniques and identified whether these were healthy or unhealthy Patients determined 2-3 healthy coping skills they would like to become more familiar with and use more often. Patients provided support and ideas to each other   Summary of Patient Progress:  The patient did not attend group.    Marshell Levan, LCSWA 03/08/2023  3:08 PM

## 2023-03-08 NOTE — Progress Notes (Signed)
Patient refused his scheduled morning medication. Patient stated "I'm not taking it period", regarding the Zyprexa; and then stated that he got Trilafon last night, and doesn't want it now, he will take it later. NP was notified via secure chat.

## 2023-03-08 NOTE — Group Note (Signed)
Date:  03/08/2023 Time:  9:19 PM  Group Topic/Focus:  Wrap-Up Group:   The focus of this group is to help patients review their daily goal of treatment and discuss progress on daily workbooks.    Participation Level:  Active  Participation Quality:  Appropriate, Attentive, Sharing, and Supportive  Affect:  Appropriate  Cognitive:  Appropriate  Insight: Appropriate and Good  Engagement in Group:  Supportive  Modes of Intervention:  Support  Additional Comments:     Kyri Carpio 03/08/2023, 9:19 PM
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
## 2023-03-08 NOTE — Group Note (Signed)
Date:  03/08/2023 Time:  10:28 AM  Group Topic/Focus:  Goals Group:   The focus of this group is to help patients establish daily goals to achieve during treatment and discuss how the patient can incorporate goal setting into their daily lives to aide in recovery. Healthy Communication:   The focus of this group is to discuss communication, barriers to communication, as well as healthy ways to communicate with others. Identifying Needs:   The focus of this group is to help patients identify their personal needs that have been historically problematic and identify healthy behaviors to address their needs.    Participation Level:  Did Not Attend   Sowmya Partridge 03/08/2023, 10:28 AM

## 2023-03-08 NOTE — Progress Notes (Signed)
Patient refused scheduled EKG, Gabapentin and Cogentin. Patient stated that he would think about doing the EKG. NP will be notified via secure chat.

## 2023-03-08 NOTE — Progress Notes (Signed)
Patient continues to refuse scheduled Gabapentin.

## 2023-03-08 NOTE — Progress Notes (Signed)
   03/08/23 1740  Provider Notification  Provider Name/Title Shaune Pollack, NP  Date Provider Notified 03/08/23  Time Provider Notified 1741  Method of Notification Page (via secure chat)  Notification Reason Red med refusal

## 2023-03-08 NOTE — Progress Notes (Signed)
Patient refused  Lab draw

## 2023-03-08 NOTE — Group Note (Signed)
Date:  03/08/2023 Time:  5:08 PM  Group Topic/Focus:  STRUCTURED GROUP ACTIVITY  The focus of the group is to promote activity for the patients to encourage exercise to go out in the courtyard and get some exercise.     Participation Level:  Did Not Attend   Rosaura Carpenter 03/08/2023, 5:08 PM
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
## 2023-03-09 NOTE — BH IP Treatment Plan (Signed)
Interdisciplinary Treatment and Diagnostic Plan Update  03/09/2023 Time of Session: 09:52 Kenneth [name] MRN: [id number]  Principal Diagnosis: Undifferentiated schizophrenia [facility])  Secondary Diagnoses: Principal Problem:   Undifferentiated schizophrenia (HCC)   Current Medications:  Current Facility-Administered Medications  Medication Dose Route Frequency Provider Last Rate Last Admin   acetaminophen (TYLENOL) tablet 650 mg  650 mg Oral Q6H PRN Starkes-Perry, Juel Burrow, FNP       alum & mag hydroxide-simeth (MAALOX/MYLANTA) 200-200-20 MG/5ML suspension 30 mL  30 mL Oral Q4H PRN Starkes-Perry, Juel Burrow, FNP       benztropine (COGENTIN) tablet 1 mg  1 mg Oral Daily Charm Rings, NP   1 mg at 03/09/23 5621   diphenhydrAMINE (BENADRYL) capsule 50 mg  50 mg Oral TID PRN Maryagnes Amos, FNP       Or   diphenhydrAMINE (BENADRYL) injection 50 mg  50 mg Intramuscular TID PRN Starkes-Perry, Juel Burrow, FNP       gabapentin (NEURONTIN) capsule 100 mg  100 mg Oral TID Charm Rings, NP   100 mg at 03/09/23 1300   LORazepam (ATIVAN) tablet 2 mg  2 mg Oral TID PRN Maryagnes Amos, FNP       Or   LORazepam (ATIVAN) injection 2 mg  2 mg Intramuscular TID PRN Starkes-Perry, Juel Burrow, FNP       magnesium hydroxide (MILK OF MAGNESIA) suspension 30 mL  30 mL Oral Daily PRN Starkes-Perry, Juel Burrow, FNP       perphenazine (TRILAFON) tablet 16 mg  16 mg Oral TID Maryagnes Amos, FNP   16 mg at 03/09/23 1300   traZODone (DESYREL) tablet 200 mg  200 mg Oral QHS Maryagnes Amos, FNP   200 mg at 03/07/23 2148   PTA Medications: Medications Prior to Admission  Medication Sig Dispense Refill Last Dose   atenolol (TENORMIN) 25 MG tablet Take 1 tablet (25 mg total) by mouth daily. (Patient not taking: Reported on 02/27/2023) 30 tablet 1    OLANZapine zydis (ZYPREXA) 10 MG disintegrating tablet Take 1 tablet (10 mg total) by mouth daily. 30 tablet 0    perphenazine (TRILAFON) 16 MG  tablet Take 1 tablet (16 mg total) by mouth 3 (three) times daily. 90 tablet 0    traZODone (DESYREL) 100 MG tablet Take 2 tablets (200 mg total) by mouth at bedtime. 60 tablet 0     Patient Stressors: Financial difficulties   Loss of family   Medication change or noncompliance   Traumatic event    Patient Strengths: Ability for insight   Treatment Modalities: Medication Management, Group therapy, Case management,  1 to 1 session with clinician, Psychoeducation, Recreational therapy.   Physician Treatment Plan for Primary Diagnosis: Undifferentiated schizophrenia (HCC) Long Term Goal(s): Improvement in symptoms so as ready for discharge   Short Term Goals: Ability to identify changes in lifestyle to reduce recurrence of condition will improve Ability to verbalize feelings will improve Ability to disclose and discuss suicidal ideas Ability to demonstrate self-control will improve Ability to identify and develop effective coping behaviors will improve Ability to maintain clinical measurements within normal limits will improve Compliance with prescribed medications will improve Ability to identify triggers associated with substance abuse/mental health issues will improve  Medication Management: Evaluate patient's response, side effects, and tolerance of medication regimen.  Therapeutic Interventions: 1 to 1 sessions, Unit Group sessions and Medication administration.  Evaluation of Outcomes: Not Met  Physician Treatment Plan for Secondary Diagnosis: Principal Problem:  Undifferentiated schizophrenia (HCC)  Long Term Goal(s): Improvement in symptoms so as ready for discharge   Short Term Goals: Ability to identify changes in lifestyle to reduce recurrence of condition will improve Ability to verbalize feelings will improve Ability to disclose and discuss suicidal ideas Ability to demonstrate self-control will improve Ability to identify and develop effective coping behaviors  will improve Ability to maintain clinical measurements within normal limits will improve Compliance with prescribed medications will improve Ability to identify triggers associated with substance abuse/mental health issues will improve     Medication Management: Evaluate patient's response, side effects, and tolerance of medication regimen.  Therapeutic Interventions: 1 to 1 sessions, Unit Group sessions and Medication administration.  Evaluation of Outcomes: Not Met   RN Treatment Plan for Primary Diagnosis: Undifferentiated schizophrenia (HCC) Long Term Goal(s): Knowledge of disease and therapeutic regimen to maintain health will improve  Short Term Goals: Ability to remain free from injury will improve, Ability to verbalize frustration and anger appropriately will improve, Ability to demonstrate self-control, Ability to participate in decision making will improve, Ability to verbalize feelings will improve, Ability to disclose and discuss suicidal ideas, Ability to identify and develop effective coping behaviors will improve, and Compliance with prescribed medications will improve  Medication Management: RN will administer medications as ordered by provider, will assess and evaluate patient's response and provide education to patient for prescribed medication. RN will report any adverse and/or side effects to prescribing provider.  Therapeutic Interventions: 1 on 1 counseling sessions, Psychoeducation, Medication administration, Evaluate responses to treatment, Monitor vital signs and CBGs as ordered, Perform/monitor CIWA, COWS, AIMS and Fall Risk screenings as ordered, Perform wound care treatments as ordered.  Evaluation of Outcomes: Not Met   LCSW Treatment Plan for Primary Diagnosis: Undifferentiated schizophrenia (HCC) Long Term Goal(s): Safe transition to appropriate next level of care at discharge, Engage patient in therapeutic group addressing interpersonal concerns.  Short Term  Goals: Engage patient in aftercare planning with referrals and resources, Increase social support, Increase ability to appropriately verbalize feelings, Increase emotional regulation, Facilitate acceptance of mental health diagnosis and concerns, and Increase skills for wellness and recovery  Therapeutic Interventions: Assess for all discharge needs, 1 to 1 time with Social worker, Explore available resources and support systems, Assess for adequacy in community support network, Educate family and significant other(s) on suicide prevention, Complete Psychosocial Assessment, Interpersonal group therapy.  Evaluation of Outcomes: Not Met   Progress in Treatment: Attending groups: Yes. Participating in groups: Yes. Taking medication as prescribed: Yes. Toleration medication: Yes. Family/Significant other contact made: No, will contact:  if given permission.  Patient understands diagnosis: No. Discussing patient identified problems/goals with staff: Yes. Medical problems stabilized or resolved: Yes. Denies suicidal/homicidal ideation: Yes. Issues/concerns per patient self-inventory: No. Other: none.  New problem(s) identified: No, Describe:  none identified.  New Short Term/Long Term Goal(s): detox, elimination of symptoms of psychosis, medication management for mood stabilization; elimination of SI thoughts; development of comprehensive mental wellness/sobriety plan.  Patient Goals:  "Go over to [facility] so they can find me an ACT team."   Discharge Plan or Barriers: CSW will assist pt with development of an appropriate aftercare/discharge plan.   Reason for Continuation of Hospitalization: Aggression Anxiety Delusions  Medication stabilization  Estimated Length of Stay: 1-7 days  Last 3 [place] Suicide Severity Risk Score: Flowsheet Row Admission (Current) from 03/07/2023 in [facility] INPATIENT BEHAVIORAL MEDICINE Most recent reading at 03/07/2023 11:30 AM ED from 03/07/2023 in [facility]  Emergency Department at  [facility] Most recent reading at 03/07/2023  1:27 AM ED from 03/02/2023 in [facility] Emergency Department at [facility] Most recent reading at 03/02/2023 12:15 AM  C-SSRS RISK CATEGORY No Risk No Risk No Risk       Last PHQ 2/9 Scores:     No data to display          Scribe for Treatment Team: Glenis Smoker, LCSW 03/09/2023 4:31 PM

## 2023-03-09 NOTE — Progress Notes (Signed)
   03/08/23 2000  Psych Admission Type (Psych Patients Only)  Admission Status Voluntary  Psychosocial Assessment  Patient Complaints Depression  Eye Contact Fair  Facial Expression Flat  Affect Apprehensive  Speech Logical/coherent;Slurred  Interaction Assertive;Forwards little  Motor Activity Slow  Appearance/Hygiene Body odor;Disheveled  Behavior Characteristics Cooperative;Appropriate to situation  Mood Pleasant  Thought Process  Coherency Circumstantial  Content Blaming others  Delusions None reported or observed  Perception Derealization  Hallucination Visual  Judgment Limited  Confusion None  Danger to Self  Current suicidal ideation? Denies  Agreement Not to Harm Self Yes  Description of Agreement verbal  Danger to Others  Danger to Others None reported or observed   No distress noted, interacting appropriately with peers and staff, he denies SI/HI/AVH will continue to monitor.

## 2023-03-09 NOTE — Progress Notes (Signed)
[facility] MD Progress Note  03/09/2023 3:37 PM Kenneth [name]  MRN:  [id number] Subjective:  The patient expresses concerns about "going home to clean up the dead bodies," stating that "it's not going to get any better for me." He reports sleeping well, with a good appetite, and rates his depression at 3/10 and anxiety at 3/10. He denies active suicidal ideation (SI), homicidal ideation (HI), or auditory hallucinations (AH). However, he continues to describe seeing "dead bodies" and believes his stepmother killed his entire family, a delusion that remains present. The patient presents with persistent delusions of seeing dead bodies and a narrative involving his family's death by his stepmother. Despite these fixed beliefs, he exhibits stable behavior on the unit, socializes appropriately, and is cooperative with treatment. His current symptoms suggest the need for continued medication adjustments and therapeutic support. Additionally, addressing homelessness concerns and stable discharge planning is essential for his long-term management.Completed Treatment Team Principal Problem: Undifferentiated schizophrenia (HCC) Diagnosis: Principal Problem:   Undifferentiated schizophrenia (HCC)  Total Time spent with patient: 1 hour  Past Psychiatric History: Schizophrenia Anxiety  Past Medical History:  Past Medical History:  Diagnosis Date   Anxiety    Hypercholesteremia    Hypertension    Schizophrenia (HCC)     Past Surgical History:  Procedure Laterality Date   CIRCUMCISION, NON-NEWBORN     Family History: History reviewed. No pertinent family history. Family Psychiatric  History: none noted Social History:  Social History   Substance and Sexual Activity  Alcohol Use Yes   Comment: social drinker     Social History   Substance and Sexual Activity  Drug Use No    Social History   Socioeconomic History   Marital status: Legally Separated    Spouse name: Not on file   Number of  children: Not on file   Years of education: Not on file   Highest education level: Not on file  Occupational History   Not on file  Tobacco Use   Smoking status: Every Day    Current packs/day: 2.00    Types: Cigarettes   Smokeless tobacco: Never  Substance and Sexual Activity   Alcohol use: Yes    Comment: social drinker   Drug use: No   Sexual activity: Not on file  Other Topics Concern   Not on file  Social History Narrative   Not on file   Social Determinants of Health   Financial Resource Strain: Not on file  Food Insecurity: Food Insecurity Present (03/07/2023)   Hunger Vital Sign    Worried About Running Out of Food in the Last Year: Sometimes true    Ran Out of Food in the Last Year: Sometimes true  Transportation Needs: No Transportation Needs (03/07/2023)   PRAPARE - Administrator, Civil Service (Medical): No    Lack of Transportation (Non-Medical): No  Physical Activity: Not on file  Stress: Not on file  Social Connections: Not on file   Additional Social History:                         Sleep: Good  Appetite:  Good  Current Medications: Current Facility-Administered Medications  Medication Dose Route Frequency Provider Last Rate Last Admin   acetaminophen (TYLENOL) tablet 650 mg  650 mg Oral Q6H PRN Starkes-Perry, Juel Burrow, FNP       alum & mag hydroxide-simeth (MAALOX/MYLANTA) 200-200-20 MG/5ML suspension 30 mL  30 mL Oral Q4H PRN Starkes-Perry,  Juel Burrow, FNP       benztropine (COGENTIN) tablet 1 mg  1 mg Oral Daily Charm Rings, NP   1 mg at 03/09/23 1610   diphenhydrAMINE (BENADRYL) capsule 50 mg  50 mg Oral TID PRN Maryagnes Amos, FNP       Or   diphenhydrAMINE (BENADRYL) injection 50 mg  50 mg Intramuscular TID PRN Rosario Adie, Juel Burrow, FNP       gabapentin (NEURONTIN) capsule 100 mg  100 mg Oral TID Charm Rings, NP   100 mg at 03/09/23 1300   LORazepam (ATIVAN) tablet 2 mg  2 mg Oral TID PRN Maryagnes Amos, FNP       Or   LORazepam (ATIVAN) injection 2 mg  2 mg Intramuscular TID PRN Starkes-Perry, Juel Burrow, FNP       magnesium hydroxide (MILK OF MAGNESIA) suspension 30 mL  30 mL Oral Daily PRN Starkes-Perry, Juel Burrow, FNP       perphenazine (TRILAFON) tablet 16 mg  16 mg Oral TID Maryagnes Amos, FNP   16 mg at 03/09/23 1300   traZODone (DESYREL) tablet 200 mg  200 mg Oral QHS Maryagnes Amos, FNP   200 mg at 03/07/23 2148    Lab Results: No results found for this or any previous visit (from the past 48 hour(s)).  Blood Alcohol level:  Lab Results  Component Value Date   ETH <10 03/07/2023   ETH <10 03/02/2023    Metabolic Disorder Labs: Lab Results  Component Value Date   HGBA1C 5.8 (H) 11/15/2016   MPG 120 11/15/2016   MPG 120 01/27/2016   Lab Results  Component Value Date   PROLACTIN 9.6 01/27/2016   Lab Results  Component Value Date   CHOL 256 (H) 11/15/2016   TRIG 204 (H) 11/15/2016   HDL 64 11/15/2016   CHOLHDL 4.0 11/15/2016   VLDL 41 (H) 11/15/2016   LDLCALC 151 (H) 11/15/2016   LDLCALC 139 (H) 01/27/2016    Physical Findings: AIMS:  , ,  ,  ,    CIWA:    COWS:     Musculoskeletal: Strength & Muscle Tone: within normal limits Gait & Station: normal Patient leans: N/A  Psychiatric Specialty Exam:  Presentation  General Appearance:  Neat; Appropriate for Environment  Eye Contact: Good  Speech: Slow; Clear and Coherent  Speech Volume: Normal  Handedness: Right   Mood and Affect  Mood: Anxious  Affect: Blunt; Flat   Thought Process  Thought Processes: Disorganized (speaks about dead relatives cannot confirm)  Descriptions of Associations:Loose  Orientation:Full (Time, Place and Person) (and situation)  Thought Content:Illogical; Delusions; Rumination  History of Schizophrenia/Schizoaffective disorder:Yes  Duration of Psychotic Symptoms:Greater than six months  Hallucinations:Hallucinations:  None Description of Auditory Hallucinations: none noted  Ideas of Reference:Delusions  Suicidal Thoughts:Suicidal Thoughts: No  Homicidal Thoughts:Homicidal Thoughts: No   Sensorium  Memory: Immediate Fair; Remote Fair  Judgment: Poor  Insight: Poor   Executive Functions  Concentration: Fair  Attention Span: Good  Recall: Fair  Fund of Knowledge: Fair  Language: Good   Psychomotor Activity  Psychomotor Activity:Psychomotor Activity: Normal   Assets  Assets: Physical Health; Communication Skills   Sleep  Sleep:Sleep: Good Number of Hours of Sleep: 8    Physical Exam: Physical Exam ROS Blood pressure 136/69, pulse 64, temperature 98 F (36.7 C), resp. rate 17, height 5\' 11"  (1.803 m), weight 111.7 kg, SpO2 100%. Body mass index is 34.34 kg/m.  Treatment Plan Summary: Daily contact with patient to assess and evaluate symptoms and progress in treatment and Medication management Continue Gabapentin 100 mg TID for anxiety. Trilafon (Perphenazine) 16 mg TID to target psychotic symptoms. Cogentin 1 mg daily to manage potential extrapyramidal side effects of antipsychotic treatment. Consult with an LCSW to assess the patient's housing status and explore resources related to homelessness. Engage the patient in supportive counseling to improve insight into his delusional beliefs and support symptom management. Continue to monitor for any escalation in SI, HI, or AH, especially given his current fixed delusions. Myriam Forehand, NP 03/09/2023, 3:37 PM

## 2023-03-09 NOTE — Group Note (Signed)
[facility] LCSW Group Therapy Note    Group Date: 03/09/2023 Start Time: 1330 End Time: 1430  Type of Therapy and Topic:  Group Therapy:  Overcoming Obstacles  Participation Level:  [facility] PARTICIPATION LEVEL: Did Not Attend  Mood:  Description of Group:   In this group patients will be encouraged to explore what they see as obstacles to their own wellness and recovery. They will be guided to discuss their thoughts, feelings, and behaviors related to these obstacles. The group will process together ways to cope with barriers, with attention given to specific choices patients can make. Each patient will be challenged to identify changes they are motivated to make in order to overcome their obstacles. This group will be process-oriented, with patients participating in exploration of their own experiences as well as giving and receiving support and challenge from other group members.  Therapeutic Goals: 1. Patient will identify personal and current obstacles as they relate to admission. 2. Patient will identify barriers that currently interfere with their wellness or overcoming obstacles.  3. Patient will identify feelings, thought process and behaviors related to these barriers. 4. Patient will identify two changes they are willing to make to overcome these obstacles:    Summary of Patient Progress   X   Therapeutic Modalities:   Cognitive Behavioral Therapy Solution Focused Therapy Motivational Interviewing Relapse Prevention Therapy   Harden Mo, LCSW

## 2023-03-09 NOTE — Group Note (Signed)
Date:  03/09/2023 Time:  6:09 PM  Group Topic/Focus:  Outdoor Structured Activity Group    Participation Level:  Active  Participation Quality:  Appropriate  Affect:  Appropriate  Cognitive:  Appropriate  Insight: Appropriate  Engagement in Group:  Engaged  Modes of Intervention:  Activity  Additional Comments:    Giulio Bertino 03/09/2023, 6:09 PM

## 2023-03-09 NOTE — Plan of Care (Signed)
?  Problem: Education: ?Goal: Verbalization of understanding the information provided will improve ?Outcome: Progressing ?  ?Problem: Activity: ?Goal: Interest or engagement in activities will improve ?Outcome: Progressing ?  ?Problem: Health Behavior/Discharge Planning: ?Goal: Compliance with treatment plan for underlying cause of condition will improve ?Outcome: Progressing ?  ?Problem: Safety: ?Goal: Periods of time without injury will increase ?Outcome: Progressing ?  ?

## 2023-03-09 NOTE — Group Note (Signed)
Date:  03/09/2023 Time:  9:53 AM  Group Topic/Focus:  Goals Group:   The focus of this group is to help patients establish daily goals to achieve during treatment and discuss how the patient can incorporate goal setting into their daily lives to aide in recovery.    Participation Level:  Active  Participation Quality:  Appropriate  Affect:  Appropriate  Cognitive:  Appropriate  Insight: Appropriate  Engagement in Group:  Engaged  Modes of Intervention:  Discussion, Education, and Support  Additional Comments:    Kenneth [name] 03/09/2023, 9:53 AM

## 2023-03-09 NOTE — Group Note (Signed)
Recreation Therapy Group Note   Group Topic:Goal Setting  Group Date: 03/09/2023 Start Time: 1015 End Time: 1115 Facilitators: Clinton Gallant, CTRS Location:  Craft Room  Group Description: Vision Boards. Patients were given many different magazines, a glue stick, markers, and a piece of cardstock paper. LRT and pts discussed the importance of having goals in life. LRT and pts discussed the difference between short-term and long-term goals, as well as what a SMART goal is. LRT encouraged pts to create a vision board, with images they picked and then cut out with safety scissors from the magazine, for themselves, that capture their short and long-term goals. LRT encouraged pts to show and explain their vision board to the group.   Goal Area(s) Addressed:  Patient will gain knowledge of short vs. long term goals.  Patient will identify goals for themselves. Patient will practice setting SMART goals. Patient will verbalize their goals to LRT and peers.   Affect/Mood: Appropriate   Participation Level: Active and Engaged   Participation Quality: Independent   Behavior: Calm and Cooperative   Speech/Thought Process: Coherent   Insight: Fair   Judgement: Fair    Modes of Intervention: Art   Patient Response to Interventions:  Attentive and Receptive   Education Outcome:  Acknowledges education   Clinical Observations/Individualized Feedback: Kenneth [name] was active in their participation of session activities and group discussion. Pt identified "I want to get back into music and stick to my vegan diet" as his goals. Pt interacted well with LRT and peers duration of session.   Plan: Continue to engage patient in RT group sessions 2-3x/week.   Rosina Lowenstein, LRT, CTRS 03/09/2023 11:39 AM

## 2023-03-09 NOTE — Plan of Care (Signed)
  Problem: Education: Goal: Emotional status will improve Outcome: Progressing   Problem: Education: Goal: Mental status will improve Outcome: Progressing   Problem: Activity: Goal: Sleeping patterns will improve Outcome: Progressing

## 2023-03-09 NOTE — Progress Notes (Signed)
Pt calm and pleasant during assessment denying SI/HI/AVH with this Clinical research associate. Pt observed interacting appropriately with staff and peers on the unit. Pt compliant with medication administration per MD orders. Pt given education, support, and encouragement to be active in his treatment plan. Pt being monitored Q 15 minutes for safety per unit protocol, remains safe on the unit

## 2023-03-10 DIAGNOSIS — F4310 Post-traumatic stress disorder, unspecified: Secondary | ICD-10-CM

## 2023-03-10 DIAGNOSIS — F203 Undifferentiated schizophrenia: Secondary | ICD-10-CM | POA: Diagnosis not present

## 2023-03-10 NOTE — Group Note (Signed)
Date:  03/10/2023 Time:  6:09 PM  Group Topic/Focus:  Outdoor recreation structured activity    Participation Level:  Active  Participation Quality:  Appropriate  Affect:  Appropriate  Cognitive:  Appropriate  Insight: Appropriate  Engagement in Group:  Developing/Improving  Modes of Intervention:  Activity  Additional Comments:    Anitria Andon 03/10/2023, 6:09 PM

## 2023-03-10 NOTE — Progress Notes (Signed)
Patient attended group and recreation. Patient focused on discharging stating "Im ready to get out of here." Patient is med compliant. Patient denies SI,HI, and auditory hallucinations. Patient continues to endorse his belief of having to go bury his family members and seeing dead bodies. Patient remains cooperative on unit. No s/s of current distress.

## 2023-03-10 NOTE — Group Note (Signed)
Date:  03/10/2023 Time:  9:10 PM  Group Topic/Focus:  Wrap-Up Group:   The focus of this group is to help patients review their daily goal of treatment and discuss progress on daily workbooks.    Participation Level:  Active  Participation Quality:  Appropriate and Attentive  Affect:  Appropriate  Cognitive:  Alert and Appropriate  Insight: Appropriate  Engagement in Group:  Improving  Modes of Intervention:  Activity, Socialization, and Support  Additional Comments:     Shantara Goosby 03/10/2023, 9:10 PM

## 2023-03-10 NOTE — Plan of Care (Signed)
  Problem: Coping: Goal: Ability to demonstrate self-control will improve Outcome: Progressing   Problem: Health Behavior/Discharge Planning: Goal: Compliance with treatment plan for underlying cause of condition will improve Outcome: Progressing   Problem: Safety: Goal: Periods of time without injury will increase Outcome: Progressing

## 2023-03-10 NOTE — Plan of Care (Signed)
Pt denies SI/HI/AVH, compliant with procedures on the unit   Problem: Education: Goal: Knowledge of [facility] Education information/materials will improve Outcome: Progressing Goal: Emotional status will improve Outcome: Progressing Goal: Mental status will improve Outcome: Progressing Goal: Verbalization of understanding the information provided will improve Outcome: Progressing   Problem: Activity: Goal: Interest or engagement in activities will improve Outcome: Progressing Goal: Sleeping patterns will improve Outcome: Progressing   Problem: Coping: Goal: Ability to verbalize frustrations and anger appropriately will improve Outcome: Progressing Goal: Ability to demonstrate self-control will improve Outcome: Progressing   Problem: Health Behavior/Discharge Planning: Goal: Identification of resources available to assist in meeting health care needs will improve Outcome: Progressing Goal: Compliance with treatment plan for underlying cause of condition will improve Outcome: Progressing   Problem: Physical Regulation: Goal: Ability to maintain clinical measurements within normal limits will improve Outcome: Progressing   Problem: Safety: Goal: Periods of time without injury will increase Outcome: Progressing

## 2023-03-10 NOTE — Group Note (Signed)
Date:  03/10/2023 Time:  10:30 AM  Group Topic/Focus:  Building Self Esteem:   The Focus of this group is helping patients become aware of the effects of self-esteem on their lives, the things they and others do that enhance or undermine their self-esteem, seeing the relationship between their level of self-esteem and the choices they make and learning ways to enhance self-esteem. Emotional Education:   The focus of this group is to discuss what feelings/emotions are, and how they are experienced. Managing Feelings:   The focus of this group is to identify what feelings patients have difficulty handling and develop a plan to handle them in a healthier way upon discharge.    Participation Level:  Active  Participation Quality:  Appropriate  Affect:  Appropriate  Cognitive:  Alert and Appropriate  Insight: Appropriate  Engagement in Group:  Developing/Improving and Engaged  Modes of Intervention:  Activity, Discussion, Education, and Socialization  Additional Comments:    Chantae Soo 03/10/2023, 10:30 AM

## 2023-03-10 NOTE — Group Note (Signed)
Date:  03/10/2023 Time:  3:04 AM  Group Topic/Focus:  Wrap-Up Group:   The focus of this group is to help patients review their daily goal of treatment and discuss progress on daily workbooks.    Participation Level:  Active  Participation Quality:  Appropriate  Affect:  Appropriate  Cognitive:  Alert and Appropriate  Insight: Appropriate  Engagement in Group:  Engaged  Modes of Intervention:  Discussion  Additional Comments:     Maglione,Maicee Ullman E 03/10/2023, 3:04 AM

## 2023-03-10 NOTE — Group Note (Signed)
Recreation Therapy Group Note   Group Topic:Self-Esteem  Group Date: 03/10/2023 Start Time: 1010 End Time: 1105 Facilitators: Rosina Lowenstein, LRT, CTRS Location:  Craft Room  Group Description: My strengths and Qualities. Patients and LRT discussed the importance of self-love/self-esteem and things that cause it to fluctuate, including our mental health or state. Pt completed a worksheet that helps them identify 24 different strengths and qualities about themselves. Pt encouraged to read aloud at least 3 off their sheet to the group. LRT and pts discussed how this can be applied to daily life post-discharge.  Pt's then played "Positive Affirmation Bingo" afterwards, with stress balls as prizes.     Goal Area(s) Addressed: Patient will identify positive qualities about themselves. Patient will learn new positive affirmations.  Patient will recite positive qualities and affirmations aloud to the group.  Patient will practice positive self-talk.  Patient will increase communication.  Affect/Mood: Appropriate   Participation Level: Active and Engaged   Participation Quality: Independent   Behavior: Calm and Cooperative   Speech/Thought Process: Coherent   Insight: Good   Judgement: Good   Modes of Intervention: Activity and Worksheet   Patient Response to Interventions:  Attentive and Receptive   Education Outcome:  Acknowledges education   Clinical Observations/Individualized Feedback: Kenneth [name] was active in their participation of session activities and group discussion. Pt identified "A compliment I have received is that I am a nice human". Pt interacted well with LRT and peers duration of session.   Plan: Continue to engage patient in RT group sessions 2-3x/week.   Rosina Lowenstein, LRT, CTRS 03/10/2023 11:26 AM

## 2023-03-10 NOTE — [facility] Counselor (Signed)
CSW sat with pt while Darius with [facility] completed an assessment to determine appropriateness for ACT team.  Meeting was tense at times, however, was terminated without incident.    Darius reports that he will follow up with Lorella Nimrod with [facility] and this CSW with a decision.   Penni Homans, MSW, LCSW 03/10/2023 4:37 PM

## 2023-03-10 NOTE — Group Note (Signed)
[facility] LCSW Group Therapy Note   Group Date: 03/10/2023 Start Time: 1315 End Time: 1415  Type of Therapy/Topic:  Group Therapy:  Feelings about Diagnosis  Participation Level:  Minimal   Description of Group:    This group will allow patients to explore their thoughts and feelings about diagnoses they have received. Patients will be guided to explore their level of understanding and acceptance of these diagnoses. Facilitator will encourage patients to process their thoughts and feelings about the reactions of others to their diagnosis, and will guide patients in identifying ways to discuss their diagnosis with significant others in their lives. This group will be process-oriented, with patients participating in exploration of their own experiences as well as giving and receiving support and challenge from other group members.   Therapeutic Goals: 1. Patient will demonstrate understanding of diagnosis as evidence by identifying two or more symptoms of the disorder:  2. Patient will be able to express two feelings regarding the diagnosis 3. Patient will demonstrate ability to communicate their needs through discussion and/or role plays  Summary of Patient Progress: Patient was present minimally during part of the group process. He participated in the icebreaker but not in the larger discussion. Pt appeared open and receptive to feedback/comments from both peers and facilitator.    Therapeutic Modalities:   Cognitive Behavioral Therapy Brief Therapy Feelings Identification    Glenis Smoker, LCSW

## 2023-03-10 NOTE — Progress Notes (Signed)
[facility] MD Progress Note  03/10/2023 12:30 PM Kenneth [name]  MRN:  [id number]  Kenneth [name] is a 57 year old male with past psychiatric history of schizophrenia, anxiety, multiple psychiatric hospitalizations, and a history of violence when off medications. He was incarcerated for 15 years for abducting a Child psychotherapist. He presented voluntarily to [facility] ED seeking evaluation for his worsening PTSD in hopes of gaining inpatient admission for medication resumption.   Subjective:   Chart reviewed, case discussed in multi disciplinary meeting today, patient seen during rounds.  Patient reports he wants to leave today because he had to take care of remains all of his family.  Patient reports that the remains of this family are in the morgue in [place].  Patient claims that his stepmother killed all his family members.  Patient reports that her primary investigator mentioned this to patient.  Later patient said that it was not an International aid/development worker but it was somebody from the police department.  Patient's thoughts were disorganized, acute times patient went tangential and had to be redirected.  Patient was frustrated and agitated, using profanity.  Later he apologized for his behavior.  Patient denies thoughts of harming himself or others.  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  Patient said that sometimes he hears voices and see things but at this point in time he denies hallucinations.  Patient is paranoid, he thinks random people trying to harm him.  He feels safe on the unit.  Patient was provided with support.  Patient was encouraged to attend groups and work on coping strategies  Principal Problem: Undifferentiated schizophrenia (HCC) Diagnosis: Principal Problem:   Undifferentiated schizophrenia (HCC) Active Problems:   PTSD (post-traumatic stress disorder)  Total Time spent with patient: 1 hour  Past Psychiatric History: Schizophrenia Anxiety  Past  Medical History:  Past Medical History:  Diagnosis Date   Anxiety    Hypercholesteremia    Hypertension    Schizophrenia (HCC)     Past Surgical History:  Procedure Laterality Date   CIRCUMCISION, NON-NEWBORN     Family History: History reviewed. No pertinent family history. Family Psychiatric  History: none noted Social History:  Social History   Substance and Sexual Activity  Alcohol Use Yes   Comment: social drinker     Social History   Substance and Sexual Activity  Drug Use No    Social History   Socioeconomic History   Marital status: Legally Separated    Spouse name: Not on file   Number of children: Not on file   Years of education: Not on file   Highest education level: Not on file  Occupational History   Not on file  Tobacco Use   Smoking status: Every Day    Current packs/day: 2.00    Types: Cigarettes   Smokeless tobacco: Never  Substance and Sexual Activity   Alcohol use: Yes    Comment: social drinker   Drug use: No   Sexual activity: Not on file  Other Topics Concern   Not on file  Social History Narrative   Not on file   Social Determinants of Health   Financial Resource Strain: Not on file  Food Insecurity: Food Insecurity Present (03/07/2023)   Hunger Vital Sign    Worried About Running Out of Food in the Last Year: Sometimes true    Ran Out of Food in the Last Year: Sometimes true  Transportation Needs: No Transportation Needs (03/07/2023)   PRAPARE - Transportation  Lack of Transportation (Medical): No    Lack of Transportation (Non-Medical): No  Physical Activity: Not on file  Stress: Not on file  Social Connections: Not on file   Additional Social History:   Patient reports he lives in a tent behind a Thrift store                      Sleep: Good  Appetite:  Good  Current Medications: Current Facility-Administered Medications  Medication Dose Route Frequency Provider Last Rate Last Admin   acetaminophen  (TYLENOL) tablet 650 mg  650 mg Oral Q6H PRN Starkes-Perry, Juel Burrow, FNP       alum & mag hydroxide-simeth (MAALOX/MYLANTA) 200-200-20 MG/5ML suspension 30 mL  30 mL Oral Q4H PRN Starkes-Perry, Juel Burrow, FNP       benztropine (COGENTIN) tablet 1 mg  1 mg Oral Daily Charm Rings, NP   1 mg at 03/10/23 9562   diphenhydrAMINE (BENADRYL) capsule 50 mg  50 mg Oral TID PRN Maryagnes Amos, FNP       Or   diphenhydrAMINE (BENADRYL) injection 50 mg  50 mg Intramuscular TID PRN Rosario Adie, Juel Burrow, FNP       gabapentin (NEURONTIN) capsule 100 mg  100 mg Oral TID Charm Rings, NP   100 mg at 03/10/23 1218   LORazepam (ATIVAN) tablet 2 mg  2 mg Oral TID PRN Maryagnes Amos, FNP       Or   LORazepam (ATIVAN) injection 2 mg  2 mg Intramuscular TID PRN Starkes-Perry, Juel Burrow, FNP       magnesium hydroxide (MILK OF MAGNESIA) suspension 30 mL  30 mL Oral Daily PRN Starkes-Perry, Juel Burrow, FNP       perphenazine (TRILAFON) tablet 16 mg  16 mg Oral TID Maryagnes Amos, FNP   16 mg at 03/10/23 1218   traZODone (DESYREL) tablet 200 mg  200 mg Oral QHS Maryagnes Amos, FNP   200 mg at 03/09/23 2126    Lab Results: No results found for this or any previous visit (from the past 48 hour(s)).  Blood Alcohol level:  Lab Results  Component Value Date   ETH <10 03/07/2023   ETH <10 03/02/2023    Metabolic Disorder Labs: Lab Results  Component Value Date   HGBA1C 5.8 (H) 11/15/2016   MPG 120 11/15/2016   MPG 120 01/27/2016   Lab Results  Component Value Date   PROLACTIN 9.6 01/27/2016   Lab Results  Component Value Date   CHOL 256 (H) 11/15/2016   TRIG 204 (H) 11/15/2016   HDL 64 11/15/2016   CHOLHDL 4.0 11/15/2016   VLDL 41 (H) 11/15/2016   LDLCALC 151 (H) 11/15/2016   LDLCALC 139 (H) 01/27/2016      Musculoskeletal: Strength & Muscle Tone: within normal limits Gait & Station: normal Patient leans: N/A  Psychiatric Specialty Exam:  Presentation  General  Appearance:  Neat; Appropriate for Environment  Eye Contact: Good  Speech: Spontaneous and goal-directed for most of the interview, at times garbled  Speech Volume: Normal  Handedness: Right   Mood and Affect  Mood: " Fine"  Affect: Anxious   Thought Process  Thought Processes: Disorganized (speaks about dead relatives cannot confirm) Illogical due to delusions  Descriptions of Associations: Tangential  Orientation:Full (Time, Place and Person) (and situation)  Thought Content:Illogical; Delusions; Rumination  History of Schizophrenia/Schizoaffective disorder:Yes  Duration of Psychotic Symptoms:Greater than six months  Hallucinations:Hallucinations: None Description of Auditory  Hallucinations: none noted  Ideas of Reference:Delusions  Suicidal Thoughts:Suicidal Thoughts: No  Homicidal Thoughts:Homicidal Thoughts: No   Sensorium  Memory: Immediate Fair; Remote Fair  Judgment: Poor  Insight: Poor   Executive Functions  Concentration: Fair  Attention Span: Fair  Fund of Knowledge: Fair  Language: Good   Psychomotor Activity  Psychomotor Activity:Psychomotor Activity: Normal   Assets  Assets: Physical Health; Communication Skills   Sleep  Sleep: Good   Physical Exam: Physical Exam Constitutional:      Appearance: Normal appearance.  HENT:     Head: Normocephalic and atraumatic.     Nose: Nose normal.  Eyes:     Pupils: Pupils are equal, round, and reactive to light.  Cardiovascular:     Rate and Rhythm: Regular rhythm.  Pulmonary:     Effort: Pulmonary effort is normal.  Skin:    General: Skin is warm.  Neurological:     General: No focal deficit present.     Mental Status: He is alert and oriented to person, place, and time.    Review of Systems  Constitutional:  Negative for chills and fever.  HENT:  Negative for hearing loss and sore throat.   Eyes:  Negative for blurred vision and double vision.   Respiratory:  Negative for cough and shortness of breath.   Cardiovascular:  Negative for chest pain and palpitations.  Gastrointestinal:  Negative for heartburn, nausea and vomiting.  Neurological:  Negative for dizziness and speech change.   Blood pressure 132/87, pulse 77, temperature (!) 97.3 F (36.3 C), resp. rate (!) 21, height 5\' 11"  (1.803 m), weight 111.7 kg, SpO2 94%. Body mass index is 34.34 kg/m.   Treatment Plan Summary: Daily contact with patient to assess and evaluate symptoms and progress in treatment and Medication management Continue Gabapentin 100 mg TID for anxiety. Trilafon (Perphenazine) 16 mg TID to target psychotic symptoms. Cogentin 1 mg daily to manage potential extrapyramidal side effects of antipsychotic treatment. Consult with an LCSW to assess the patient's housing status and explore resources related to homelessness. Engage the patient in supportive counseling to improve insight into his delusional beliefs and support symptom management. Continue to monitor for any escalation in SI, HI, or AH, especially given his current fixed delusions. Social worker consulted to confirm patient's baseline  Lewanda Rife, MD

## 2023-03-11 DIAGNOSIS — F203 Undifferentiated schizophrenia: Secondary | ICD-10-CM | POA: Diagnosis not present

## 2023-03-11 DIAGNOSIS — F4310 Post-traumatic stress disorder, unspecified: Secondary | ICD-10-CM | POA: Diagnosis not present

## 2023-03-11 NOTE — [facility] Suicide Risk Assessment (Signed)
[facility] INPATIENT:  Family/Significant Other Suicide Prevention Education  Suicide Prevention Education:  Patient Refusal for Family/Significant Other Suicide Prevention Education: The patient Kenneth [name] has refused to provide written consent for family/significant other to be provided Family/Significant Other Suicide Prevention Education during admission and/or prior to discharge.  Physician notified.  SPE completed with pt, as pt refused to consent to family contact. SPI pamphlet provided to pt and pt was encouraged to share information with support network, ask questions, and talk about any concerns relating to SPE. Pt denies access to guns/firearms and verbalized understanding of information provided. Mobile Crisis information also provided to pt.   Harden Mo 03/11/2023, 1:16 PM

## 2023-03-11 NOTE — Progress Notes (Signed)
   03/11/23 0836  Psych Admission Type (Psych Patients Only)  Admission Status Voluntary  Psychosocial Assessment  Patient Complaints None  Eye Contact Fair  Facial Expression Flat  Affect Appropriate to circumstance  Speech Logical/coherent;Slurred  Interaction Assertive  Motor Activity Slow  Appearance/Hygiene Improved  Behavior Characteristics Cooperative;Appropriate to situation  Mood Anxious  Thought Process  Coherency Circumstantial  Content Blaming others  Delusions None reported or observed  Perception Derealization  Hallucination Visual  Judgment Limited  Confusion None  Danger to Self  Current suicidal ideation? Denies  Agreement Not to Harm Self Yes  Description of Agreement verbal  Danger to Others  Danger to Others None reported or observed

## 2023-03-11 NOTE — Plan of Care (Signed)
  Problem: Education: Goal: Emotional status will improve Outcome: Progressing   Problem: Activity: Goal: Sleeping patterns will improve Outcome: Progressing   

## 2023-03-11 NOTE — Plan of Care (Signed)
  Problem: Activity: Goal: Interest or engagement in activities will improve Outcome: Progressing Goal: Sleeping patterns will improve Outcome: Progressing   Problem: Health Behavior/Discharge Planning: Goal: Compliance with treatment plan for underlying cause of condition will improve Outcome: Progressing   

## 2023-03-11 NOTE — Progress Notes (Signed)
Nursing Shift Note:  1900-0700  Attended Evening Group: Yes Medication Compliant:  n/a Behavior: isolative Sleep Quality: good Significant Changes: Pt was sleeping at PM med pass and did not answer when offered Trazodone.   03/11/23 0000  Psych Admission Type (Psych Patients Only)  Admission Status Voluntary  Psychosocial Assessment  Patient Complaints Depression  Eye Contact Fair  Interaction Isolative  Thought Process  Coherency Unable to assess  Content UTA  Delusions UTA  Perception UTA  Hallucination UTA  Judgment UTA  Confusion UTA  Danger to Others  Danger to Others None reported or observed

## 2023-03-11 NOTE — Group Note (Signed)
Date:  03/11/2023 Time:  2:14 PM  Group Topic/Focus:  Goals Group:   The focus of this group is to help patients establish daily goals to achieve during treatment and discuss how the patient can incorporate goal setting into their daily lives to aide in recovery.    Participation Level:  Active  Participation Quality:  Appropriate  Affect:  Appropriate  Cognitive:  Appropriate  Insight: Appropriate  Engagement in Group:  Engaged  Modes of Intervention:  Discussion, Education, and Support  Additional Comments:     Wilford Corner 03/11/2023, 2:14 PM

## 2023-03-11 NOTE — Progress Notes (Signed)
Pt calm and pleasant during assessment denying SI/HI/AVH with this Clinical research associate. Pt observed interacting appropriately with staff and peers on the unit. Pt compliant with medication administration per MD orders. Pt given education, support, and encouragement to be active in his treatment plan. Pt being monitored Q 15 minutes for safety per unit protocol, remains safe on the unit

## 2023-03-11 NOTE — Progress Notes (Signed)
[facility] MD Progress Note  03/11/2023 7:53 PM Kenneth [name]  MRN:  [id number]  Kenneth [name] is a 57 year old male with past psychiatric history of schizophrenia, anxiety, multiple psychiatric hospitalizations, and a history of violence when off medications. He was incarcerated for 15 years for abducting a Child psychotherapist. He presented voluntarily to [facility] ED seeking evaluation for his worsening PTSD in hopes of gaining inpatient admission for medication resumption.   Subjective:   Chart reviewed, case discussed in multi disciplinary meeting today, patient seen during rounds.  Patient is focused on his discharge.  He continues to say that the remains of this family are in the morgue in [place].  Patient claims that his stepmother killed all his family members.  Patient said his family was killed in 5s.    Patient's thoughts were disorganized, at times patient went tangential and had to be redirected.  Patient reports he lives in a tent behind a [employer], materials.  Patient denies thoughts of harming himself or others.  Principal Problem: Undifferentiated schizophrenia (HCC) Diagnosis: Principal Problem:   Undifferentiated schizophrenia (HCC) Active Problems:   PTSD (post-traumatic stress disorder)  Total Time spent with patient: 1 hour  Past Psychiatric History: Schizophrenia Anxiety  Past Medical History:  Past Medical History:  Diagnosis Date   Anxiety    Hypercholesteremia    Hypertension    Schizophrenia (HCC)     Past Surgical History:  Procedure Laterality Date   CIRCUMCISION, NON-NEWBORN     Family History: History reviewed. No pertinent family history. Family Psychiatric  History: none noted Social History:  Social History   Substance and Sexual Activity  Alcohol Use Yes   Comment: social drinker     Social History   Substance and Sexual Activity  Drug Use No    Social History   Socioeconomic History   Marital status: Legally Separated    Spouse  name: Not on file   Number of children: Not on file   Years of education: Not on file   Highest education level: Not on file  Occupational History   Not on file  Tobacco Use   Smoking status: Every Day    Current packs/day: 2.00    Types: Cigarettes   Smokeless tobacco: Never  Substance and Sexual Activity   Alcohol use: Yes    Comment: social drinker   Drug use: No   Sexual activity: Not on file  Other Topics Concern   Not on file  Social History Narrative   Not on file   Social Determinants of Health   Financial Resource Strain: Not on file  Food Insecurity: Food Insecurity Present (03/07/2023)   Hunger Vital Sign    Worried About Running Out of Food in the Last Year: Sometimes true    Ran Out of Food in the Last Year: Sometimes true  Transportation Needs: No Transportation Needs (03/07/2023)   PRAPARE - Administrator, Civil Service (Medical): No    Lack of Transportation (Non-Medical): No  Physical Activity: Not on file  Stress: Not on file  Social Connections: Not on file   Additional Social History:   Patient reports he lives in a tent behind a Th[employer] store                      Sleep: Good  Appetite:  Good  Current Medications: Current Facility-Administered Medications  Medication Dose Route Frequency Provider Last Rate Last Admin   acetaminophen (TYLENOL) tablet 650 mg  650 mg Oral Q6H PRN Maryagnes Amos, FNP       alum & mag hydroxide-simeth (MAALOX/MYLANTA) 200-200-20 MG/5ML suspension 30 mL  30 mL Oral Q4H PRN Starkes-Perry, Juel Burrow, FNP       benztropine (COGENTIN) tablet 1 mg  1 mg Oral Daily Charm Rings, NP   1 mg at 03/11/23 4098   diphenhydrAMINE (BENADRYL) capsule 50 mg  50 mg Oral TID PRN Maryagnes Amos, FNP       Or   diphenhydrAMINE (BENADRYL) injection 50 mg  50 mg Intramuscular TID PRN Rosario Adie, Juel Burrow, FNP       gabapentin (NEURONTIN) capsule 100 mg  100 mg Oral TID Charm Rings, NP   100 mg at  03/11/23 1740   LORazepam (ATIVAN) tablet 2 mg  2 mg Oral TID PRN Maryagnes Amos, FNP       Or   LORazepam (ATIVAN) injection 2 mg  2 mg Intramuscular TID PRN Starkes-Perry, Juel Burrow, FNP       magnesium hydroxide (MILK OF MAGNESIA) suspension 30 mL  30 mL Oral Daily PRN Starkes-Perry, Juel Burrow, FNP       perphenazine (TRILAFON) tablet 16 mg  16 mg Oral TID Maryagnes Amos, FNP   16 mg at 03/11/23 1740   traZODone (DESYREL) tablet 200 mg  200 mg Oral QHS Maryagnes Amos, FNP   200 mg at 03/09/23 2126    Lab Results: No results found for this or any previous visit (from the past 48 hour(s)).  Blood Alcohol level:  Lab Results  Component Value Date   ETH <10 03/07/2023   ETH <10 03/02/2023    Metabolic Disorder Labs: Lab Results  Component Value Date   HGBA1C 5.8 (H) 11/15/2016   MPG 120 11/15/2016   MPG 120 01/27/2016   Lab Results  Component Value Date   PROLACTIN 9.6 01/27/2016   Lab Results  Component Value Date   CHOL 256 (H) 11/15/2016   TRIG 204 (H) 11/15/2016   HDL 64 11/15/2016   CHOLHDL 4.0 11/15/2016   VLDL 41 (H) 11/15/2016   LDLCALC 151 (H) 11/15/2016   LDLCALC 139 (H) 01/27/2016      Musculoskeletal: Strength & Muscle Tone: within normal limits Gait & Station: normal Patient leans: N/A  Psychiatric Specialty Exam:  Presentation  General Appearance:  Neat; Appropriate for Environment  Eye Contact: Good  Speech: Spontaneous and goal-directed for most of the interview, at times garbled  Speech Volume: Normal  Handedness: Right   Mood and Affect  Mood: " Fine"  Affect: Anxious   Thought Process  Thought Processes: Disorganized (speaks about dead relatives cannot confirm) Illogical due to delusions  Descriptions of Associations: Tangential  Orientation:Full (Time, Place and Person) (and situation)  Thought Content:Illogical; Delusions; Rumination  History of Schizophrenia/Schizoaffective  disorder:Yes  Duration of Psychotic Symptoms:Greater than six months  Hallucinations:No data recorded  Ideas of Reference:Delusions  Suicidal Thoughts:No data recorded  Homicidal Thoughts:No data recorded   Sensorium  Memory: Immediate Fair; Remote Fair  Judgment: Poor  Insight: Poor   Executive Functions  Concentration: Fair  Attention Span: Fair  Fund of Knowledge: Fair  Language: Good   Psychomotor Activity  Psychomotor Activity:No data recorded   Assets  Assets: Physical Health; Communication Skills   Sleep  Sleep: Good   Physical Exam: Physical Exam Constitutional:      Appearance: Normal appearance.  HENT:     Head: Normocephalic and atraumatic.  Nose: Nose normal.  Eyes:     Pupils: Pupils are equal, round, and reactive to light.  Cardiovascular:     Rate and Rhythm: Regular rhythm.  Pulmonary:     Effort: Pulmonary effort is normal.  Skin:    General: Skin is warm.  Neurological:     General: No focal deficit present.     Mental Status: He is alert and oriented to person, place, and time.    Review of Systems  Constitutional:  Negative for chills and fever.  HENT:  Negative for hearing loss and sore throat.   Eyes:  Negative for blurred vision and double vision.  Respiratory:  Negative for cough and shortness of breath.   Cardiovascular:  Negative for chest pain and palpitations.  Gastrointestinal:  Negative for heartburn, nausea and vomiting.  Neurological:  Negative for dizziness and speech change.   Blood pressure (!) 166/98, pulse 76, temperature (!) 97.3 F (36.3 C), resp. rate (!) 21, height 5\' 11"  (1.803 m), weight 111.7 kg, SpO2 100%. Body mass index is 34.34 kg/m.   Treatment Plan Summary: Daily contact with patient to assess and evaluate symptoms and progress in treatment and Medication management Continue Gabapentin 100 mg TID for anxiety. Trilafon (Perphenazine) 16 mg TID to target psychotic  symptoms. Cogentin 1 mg daily to manage potential extrapyramidal side effects of antipsychotic treatment. Consult with an LCSW to assess the patient's housing status and explore resources related to homelessness. Engage the patient in supportive counseling to improve insight into his delusional beliefs and support symptom management. Continue to monitor for any escalation in SI, HI, or AH, especially given his current fixed delusions. Social worker consulted to confirm patient's baseline  Lewanda Rife, MD

## 2023-03-11 NOTE — Group Note (Signed)
Date:  03/11/2023 Time:  9:14 PM  Group Topic/Focus:  Stages of Change:   The focus of this group is to explain the stages of change and help patients identify changes they want to make upon discharge.    Participation Level:  Active  Participation Quality:  Appropriate and Attentive  Affect:  Appropriate  Cognitive:  Alert and Appropriate  Insight: Appropriate, Good, and Improving  Engagement in Group:  Developing/Improving and Engaged  Modes of Intervention:  Clarification, Discussion, Education, Rapport Building, Socialization, and Support  Additional Comments:     Brenner Visconti 03/11/2023, 9:14 PM

## 2023-03-11 NOTE — Group Note (Signed)
Date:  03/11/2023 Time:  6:57 PM  Group Topic/Focus:  Wellness Toolbox:   The focus of this group is to discuss various aspects of wellness, balancing those aspects and exploring ways to increase the ability to experience wellness.  Patients will create a wellness toolbox for use upon discharge.    Participation Level:  Active  Participation Quality:  Attentive  Affect:  Appropriate  Cognitive:  Appropriate  Insight: Appropriate  Engagement in Group:  Engaged  Modes of Intervention:  Activity  Additional Comments:    Wilford Corner 03/11/2023, 6:57 PM

## 2023-03-11 NOTE — Group Note (Signed)
Recreation Therapy Group Note   Group Topic:Coping Skills  Group Date: 03/11/2023 Start Time: 1010 End Time: 1110 Facilitators: Rosina Lowenstein, LRT, CTRS Location:  Craft Room  Group Description: Mind Map.  Patient was provided a blank template of a diagram with 32 blank boxes in a tiered system, branching from the center (similar to a bubble chart). LRT directed patients to label the middle of the diagram "Coping Skills". LRT and patients then came up with 8 different coping skills as examples. Pt were directed to record their coping skills in the 2nd tier boxes closest to the center.  Patients would then share their coping skills with the group as LRT wrote them out. LRT gave a handout of 99 different coping skills at the end of group.   Goal Area(s) Addressed: Patients will be able to define "coping skills". Patient will identify new coping skills.  Patient will increase communication.   Affect/Mood: Appropriate   Participation Level: Active and Engaged   Participation Quality: Independent   Behavior: Calm and Cooperative   Speech/Thought Process: Coherent and Loose association   Insight: Fair   Judgement: Fair    Modes of Intervention: Activity and Worksheet   Patient Response to Interventions:  Attentive, Engaged, and Receptive   Education Outcome:  Acknowledges education   Clinical Observations/Individualized Feedback: Kenneth [name] was active in their participation of session activities and group discussion. Pt identified "biking, mountain climbing, and making music" as coping skills. Pt came late, however joined with no issue. Pt interacted well with LRT and peers duration of session.   Plan: Continue to engage patient in RT group sessions 2-3x/week.   Rosina Lowenstein, LRT, CTRS 03/11/2023 11:25 AM

## 2023-03-12 DIAGNOSIS — F203 Undifferentiated schizophrenia: Secondary | ICD-10-CM | POA: Diagnosis not present

## 2023-03-12 DIAGNOSIS — F4310 Post-traumatic stress disorder, unspecified: Secondary | ICD-10-CM | POA: Diagnosis not present

## 2023-03-12 LAB — LIPID PANEL
Cholesterol: 195 mg/dL (ref 0–200)
HDL: 57 mg/dL (ref >40)
LDL Cholesterol: 73 mg/dL (ref 0–99)
Total CHOL/HDL Ratio: 3.4 {ratio}
Triglycerides: 325 mg/dL — ABNORMAL HIGH (ref <150)
VLDL: 65 mg/dL — ABNORMAL HIGH (ref 0–40)

## 2023-03-12 LAB — HEMOGLOBIN A1C
Hgb A1c MFr Bld: 5.7 % — ABNORMAL HIGH (ref 4.8–5.6)
Mean Plasma Glucose: 116.89 mg/dL

## 2023-03-12 LAB — TSH: TSH: 3.995 u[IU]/mL (ref 0.350–4.500)

## 2023-03-12 NOTE — Group Note (Signed)
Recreation Therapy Group Note   Group Topic:Other  Group Date: 03/12/2023 Start Time: 1000 End Time: 1100 Facilitators: Rosina Lowenstein, LRT, CTRS Location:  Craft Room  Group Description: Sport and exercise psychologist. Pts were given a card with a Halloween term on it and asked to draw it out on the dry erase board, one at a time and without using words or sound, for the group to guess what they have drawn. Once the image was successfully drawn, the next person would draw their term on the board. 2 mummies were identified voluntarily from the group. The rest of the group was split into two groups. Pts were encouraged to wrap the 2 mummies from head to toe with a roll of toilet paper. The team to do so fastest and with the whole roll of toilet paper, wins.   Goal Area(s) Addressed:  Patient will increase communication skills. Patient will engage in a team-building activity.  Patient will build frustration tolerance skills.  Patient will gain knowledge of an emotional expression activity, like drawing.    Affect/Mood: Appropriate and Flat   Participation Level: Active and Engaged   Participation Quality: Independent   Behavior: Appropriate, Calm, and Cooperative   Speech/Thought Process: Coherent   Insight: Good   Judgement: Fair    Modes of Intervention: Activity   Patient Response to Interventions:  Attentive, Engaged, Interested , and Receptive   Education Outcome:  Acknowledges education   Clinical Observations/Individualized Feedback: Kenneth [name] was active in their participation of session activities and group discussion. Pt interacted well with LRT and peers duration of session.   Plan: Continue to engage patient in RT group sessions 2-3x/week.   Rosina Lowenstein, LRT, CTRS 03/12/2023 11:23 AM

## 2023-03-12 NOTE — Plan of Care (Signed)
Pt denies SI/HI/AVH, compliant with procedures on the unit   Problem: Education: Goal: Knowledge of [facility] Education information/materials will improve Outcome: Progressing Goal: Emotional status will improve Outcome: Progressing Goal: Mental status will improve Outcome: Progressing Goal: Verbalization of understanding the information provided will improve Outcome: Progressing   Problem: Activity: Goal: Interest or engagement in activities will improve Outcome: Progressing Goal: Sleeping patterns will improve Outcome: Progressing   Problem: Coping: Goal: Ability to verbalize frustrations and anger appropriately will improve Outcome: Progressing Goal: Ability to demonstrate self-control will improve Outcome: Progressing   Problem: Health Behavior/Discharge Planning: Goal: Identification of resources available to assist in meeting health care needs will improve Outcome: Progressing Goal: Compliance with treatment plan for underlying cause of condition will improve Outcome: Progressing   Problem: Physical Regulation: Goal: Ability to maintain clinical measurements within normal limits will improve Outcome: Progressing   Problem: Safety: Goal: Periods of time without injury will increase Outcome: Progressing

## 2023-03-12 NOTE — Progress Notes (Signed)
[facility] MD Progress Note  03/12/2023 12:55 PM Kenneth [name]  MRN:  [id number]  Kenneth [name] is a 57 year old male with past psychiatric history of schizophrenia, anxiety, multiple psychiatric hospitalizations, and a history of violence when off medications. He was incarcerated for 15 years for abducting a Child psychotherapist. He presented voluntarily to [facility] ED seeking evaluation for his worsening PTSD in hopes of gaining inpatient admission for medication resumption.   Subjective:   Chart reviewed, case discussed in multi disciplinary meeting today, patient seen during rounds.  Per staff report patient is doing better on the unit.  He has been compliant with medication.  No behavioral issues reported by staff.  Today patient reports that he feels in "good".  Patient continues to believe that his family's remains are still in the morgue in [place].  This probably is patient's fixed delusion.  Otherwise patient is doing better.  His thoughts are more goal-directed and organized.  Patient is focused on his discharge.  He said that he will go back to his tent upon discharge. Patient denies thoughts of harming himself or others.  He denies auditory visual hallucinations.  He has been participating in milieu.  Principal Problem: Undifferentiated schizophrenia (HCC) Diagnosis: Principal Problem:   Undifferentiated schizophrenia (HCC) Active Problems:   PTSD (post-traumatic stress disorder)   Past Psychiatric History: Schizophrenia Anxiety  Past Medical History:  Past Medical History:  Diagnosis Date   Anxiety    Hypercholesteremia    Hypertension    Schizophrenia (HCC)     Past Surgical History:  Procedure Laterality Date   CIRCUMCISION, NON-NEWBORN     Family History: History reviewed. No pertinent family history. Family Psychiatric  History: none noted Social History:  Social History   Substance and Sexual Activity  Alcohol Use Yes   Comment: social drinker      Social History   Substance and Sexual Activity  Drug Use No    Social History   Socioeconomic History   Marital status: Legally Separated    Spouse name: Not on file   Number of children: Not on file   Years of education: Not on file   Highest education level: Not on file  Occupational History   Not on file  Tobacco Use   Smoking status: Every Day    Current packs/day: 2.00    Types: Cigarettes   Smokeless tobacco: Never  Substance and Sexual Activity   Alcohol use: Yes    Comment: social drinker   Drug use: No   Sexual activity: Not on file  Other Topics Concern   Not on file  Social History Narrative   Not on file   Social Determinants of Health   Financial Resource Strain: Not on file  Food Insecurity: Food Insecurity Present (03/07/2023)   Hunger Vital Sign    Worried About Running Out of Food in the Last Year: Sometimes true    Ran Out of Food in the Last Year: Sometimes true  Transportation Needs: No Transportation Needs (03/07/2023)   PRAPARE - Administrator, Civil Service (Medical): No    Lack of Transportation (Non-Medical): No  Physical Activity: Not on file  Stress: Not on file  Social Connections: Not on file   Additional Social History:   Patient reports he lives in a tent behind a [employer]                      Sleep: Good  Appetite:  Good  Current Medications: Current Facility-Administered Medications  Medication Dose Route Frequency Provider Last Rate Last Admin   acetaminophen (TYLENOL) tablet 650 mg  650 mg Oral Q6H PRN Starkes-Perry, Juel Burrow, FNP       alum & mag hydroxide-simeth (MAALOX/MYLANTA) 200-200-20 MG/5ML suspension 30 mL  30 mL Oral Q4H PRN Starkes-Perry, Juel Burrow, FNP       benztropine (COGENTIN) tablet 1 mg  1 mg Oral Daily Charm Rings, NP   1 mg at 03/12/23 0730   diphenhydrAMINE (BENADRYL) capsule 50 mg  50 mg Oral TID PRN Maryagnes Amos, FNP       Or   diphenhydrAMINE (BENADRYL)  injection 50 mg  50 mg Intramuscular TID PRN Maryagnes Amos, FNP       gabapentin (NEURONTIN) capsule 100 mg  100 mg Oral TID Charm Rings, NP   100 mg at 03/12/23 1209   LORazepam (ATIVAN) tablet 2 mg  2 mg Oral TID PRN Maryagnes Amos, FNP       Or   LORazepam (ATIVAN) injection 2 mg  2 mg Intramuscular TID PRN Starkes-Perry, Juel Burrow, FNP       magnesium hydroxide (MILK OF MAGNESIA) suspension 30 mL  30 mL Oral Daily PRN Starkes-Perry, Juel Burrow, FNP       perphenazine (TRILAFON) tablet 16 mg  16 mg Oral TID Maryagnes Amos, FNP   16 mg at 03/12/23 1209   traZODone (DESYREL) tablet 200 mg  200 mg Oral QHS Maryagnes Amos, FNP   200 mg at 03/11/23 2117    Lab Results: No results found for this or any previous visit (from the past 48 hour(s)).  Blood Alcohol level:  Lab Results  Component Value Date   ETH <10 03/07/2023   ETH <10 03/02/2023    Metabolic Disorder Labs: Lab Results  Component Value Date   HGBA1C 5.8 (H) 11/15/2016   MPG 120 11/15/2016   MPG 120 01/27/2016   Lab Results  Component Value Date   PROLACTIN 9.6 01/27/2016   Lab Results  Component Value Date   CHOL 256 (H) 11/15/2016   TRIG 204 (H) 11/15/2016   HDL 64 11/15/2016   CHOLHDL 4.0 11/15/2016   VLDL 41 (H) 11/15/2016   LDLCALC 151 (H) 11/15/2016   LDLCALC 139 (H) 01/27/2016      Musculoskeletal: Strength & Muscle Tone: within normal limits Gait & Station: normal Patient leans: N/A  Psychiatric Specialty Exam:  Presentation  General Appearance:  Neat; Appropriate for Environment  Eye Contact: Good  Speech: Spontaneous   Speech Volume: Normal  Handedness: Right   Mood and Affect  Mood: " Good"  Affect: Stable   Thought Process  Thought Processes: Coherent and goal directed   Descriptions of Associations: Intact  Orientation:Full (Time, Place and Person) (and situation)  Thought Content: Patient probably had a fixed delusion about his  dead family  History of Schizophrenia/Schizoaffective disorder:Yes  Duration of Psychotic Symptoms:Greater than six months  Hallucinations: Denies  Ideas of Reference:Delusions  Suicidal Thoughts: Denies suicidal thought, no intention or plan  Homicidal Thoughts: Denies homicidal thoughts, no intention or plan   Sensorium  Memory: Immediate Fair; Remote Fair  Judgment: Improving   Insight: Shallow   Executive Functions  Concentration: Fair  Attention Span: Fair  Fund of Knowledge: Fair  Language: Good   Psychomotor Activity  Psychomotor Activity: Normal   Assets  Assets: Physical Health; Communication Skills   Sleep  Sleep: Good   Physical Exam:  Physical Exam Constitutional:      Appearance: Normal appearance.  HENT:     Head: Normocephalic and atraumatic.     Nose: Nose normal.  Eyes:     Pupils: Pupils are equal, round, and reactive to light.  Cardiovascular:     Rate and Rhythm: Regular rhythm.  Pulmonary:     Effort: Pulmonary effort is normal.  Skin:    General: Skin is warm.  Neurological:     General: No focal deficit present.     Mental Status: He is alert and oriented to person, place, and time.    Review of Systems  Constitutional:  Negative for chills and fever.  HENT:  Negative for hearing loss and sore throat.   Eyes:  Negative for blurred vision and double vision.  Respiratory:  Negative for cough and shortness of breath.   Cardiovascular:  Negative for chest pain and palpitations.  Gastrointestinal:  Negative for heartburn, nausea and vomiting.  Neurological:  Negative for dizziness and speech change.   Blood pressure 134/77, pulse 74, temperature 97.8 F (36.6 C), temperature source Oral, resp. rate (!) 21, height 5\' 11"  (1.803 m), weight 111.7 kg, SpO2 97%. Body mass index is 34.34 kg/m.   Treatment Plan Summary: Daily contact with patient to assess and evaluate symptoms and progress in treatment and Medication  management Continue Gabapentin 100 mg TID for anxiety. Trilafon (Perphenazine) 16 mg TID to target psychotic symptoms. Cogentin 1 mg daily to manage potential extrapyramidal side effects of antipsychotic treatment. Consult with an LCSW to assess the patient's housing status and explore resources related to homelessness. Engage the patient in supportive counseling to improve insight into his delusional beliefs and support symptom management. Patient is probably at his baseline with fixed delusions  Lewanda Rife, MD

## 2023-03-12 NOTE — Progress Notes (Signed)
Patient denies SI, HI, and AVH. He is animated this morning. He is compliant with scheduled medication. Support and encouragement provided.

## 2023-03-12 NOTE — Group Note (Signed)
Date:  03/12/2023 Time:  10:02 PM  Group Topic/Focus:  Making Healthy Choices:   The focus of this group is to help patients identify negative/unhealthy choices they were using prior to admission and identify positive/healthier coping strategies to replace them upon discharge.    Participation Level:  Active  Participation Quality:   appropriate and good  Affect:  Appropriate  Cognitive:  Alert and Appropriate  Insight: Appropriate and Good  Engagement in Group:  Developing/Improving and Engaged  Modes of Intervention:  Activity, Clarification, Discussion, Education, Rapport Building, Role-play, Socialization, and Support  Additional Comments:     Yuval Nolet 03/12/2023, 10:02 PM

## 2023-03-12 NOTE — Group Note (Signed)
Date:  03/12/2023 Time:  6:33 PM  Group Topic/Focus:  Goals Group:   The focus of this group is to help patients establish daily goals to achieve during treatment and discuss how the patient can incorporate goal setting into their daily lives to aide in recovery.    Participation Level:  Active  Participation Quality:  Appropriate  Affect:  Appropriate  Cognitive:  Appropriate  Insight: Appropriate  Engagement in Group:  Engaged  Modes of Intervention:  Discussion and Education  Additional Comments:    Kenneth [name] A Kenneth [name] 03/12/2023, 6:33 PM

## 2023-03-12 NOTE — Group Note (Signed)
Date:  03/12/2023 Time:  7:14 PM  Group Topic/Focus:  Activity Group:  The focus of the group is to encourage activity with the patients and have them go outside to the courtyard for some fresh air and some exercise.    Participation Level:  Active  Participation Quality:  Appropriate  Affect:  Appropriate  Cognitive:  Appropriate  Insight: Appropriate  Engagement in Group:  Engaged  Modes of Intervention:  Activity  Additional Comments:    Mary Sella Rmoni Keplinger 03/12/2023, 7:14 PM

## 2023-03-13 DIAGNOSIS — F4310 Post-traumatic stress disorder, unspecified: Secondary | ICD-10-CM | POA: Diagnosis not present

## 2023-03-13 DIAGNOSIS — F203 Undifferentiated schizophrenia: Secondary | ICD-10-CM | POA: Diagnosis not present

## 2023-03-13 MED ORDER — GABAPENTIN 100 MG PO CAPS: 100.0000 mg | ORAL_CAPSULE | Freq: Three times a day (TID) | ORAL | 0 refills | Status: AC

## 2023-03-13 MED ORDER — TRAZODONE HCL 100 MG PO TABS: 200.0000 mg | ORAL_TABLET | Freq: Every day | ORAL | 0 refills | Status: AC

## 2023-03-13 MED ORDER — PERPHENAZINE 16 MG PO TABS: 16.0000 mg | ORAL_TABLET | Freq: Three times a day (TID) | ORAL | 0 refills | Status: AC

## 2023-03-13 NOTE — Progress Notes (Signed)
  [facility] Adult Case Management Discharge Plan :  Will you be returning to the same living situation after discharge:  Yes,  pt reports that he will go to shelter At discharge, do you have transportation home?: Yes,  CSW provided bus pass. Do you have the ability to pay for your medications: No.  Release of information consent forms completed and in the chart;  Patient's signature needed at discharge.  Patient to Follow up at:  Follow-up Information     [facility] Follow up.   Why: Your ACT referral has been completed.  ACT Team Lead will follow up with you.  Scheduled hospital follow up appointment is 03/18/2023 at [facility] information: [address] [place] [address] [phone]                 Next level of care provider has access to [facility] Link:no  Safety Planning and Suicide Prevention discussed: Yes,  SPE completed with the patient.     Has patient been referred to the Quitline?: Patient refused referral for treatment  Patient has been referred for addiction treatment: No known substance use disorder.  Harden Mo, LCSW 03/13/2023, 11:47 AM
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
## 2023-03-13 NOTE — Group Note (Signed)
Recreation Therapy Group Note   Group Topic:Leisure Education  Group Date: 03/13/2023 Start Time: 1100 End Time: 1200 Facilitators: Rosina Lowenstein, LRT, CTRS Location:  Craft Room  Group Description: Leisure. Patients were given the option to choose from singing karaoke, coloring mandalas, using oil pastels, journaling, or playing with play-doh. LRT and pts discussed the meaning of leisure, the importance of participating in leisure during their free time/when they're outside of the hospital, as well as how our leisure interests can also serve as coping skills.    Goal Area(s) Addressed:  Patient will identify a current leisure interest.  Patient will learn the definition of "leisure". Patient will practice making a positive decision. Patient will have the opportunity to try a new leisure activity. Patient will communicate with peers and LRT.    Affect/Mood: Appropriate   Participation Level: Active and Engaged   Participation Quality: Independent   Behavior: Calm and Cooperative   Speech/Thought Process: Coherent   Insight: Good   Judgement: Good   Modes of Intervention: Activity   Patient Response to Interventions:  Attentive and Receptive   Education Outcome:  Acknowledges education   Clinical Observations/Individualized Feedback: Kenneth [name] was active in their participation of session activities and group discussion. Pt identified "play an instrument and sing" as things he does in his free time. Pt chose to sing karaoke while in group. Pt interacted well with LRT and peers duration of session.   Plan: Continue to engage patient in RT group sessions 2-3x/week.   Rosina Lowenstein, LRT, CTRS 03/13/2023 12:57 PM

## 2023-03-13 NOTE — Plan of Care (Signed)
.  D- Patient alert and oriented.Denies SI, HI, AVH, and pain.  A- Scheduled medications administered to patient, per MD orders. Support and encouragement provided.  Routine safety checks conducted every 15 minutes.  Patient informed to notify staff with problems or concerns. R- No adverse drug reactions noted. Patient contracts for safety at this time. Patient compliant with medications and treatment plan. Patient receptive, calm, and cooperative. Patient interacts well with others on the unit.  Patient remains safe at this time.  Problem: Education: Goal: Emotional status will improve Outcome: Progressing Goal: Mental status will improve Outcome: Progressing Goal: Verbalization of understanding the information provided will improve Outcome: Progressing   Problem: Activity: Goal: Interest or engagement in activities will improve Outcome: Progressing

## 2023-03-13 NOTE — Progress Notes (Signed)
D- Patient alert and oriented. Patient presents in a pleasant mood on assessment reporting that he slept good last night and had no complaints to voice to this Clinical research associate. Patient denies SI, HI, AVH, and pain at this time. Patient also denies any signs/symptoms of depression and anxiety, stating that he is just ready to go. Patient had no stated goals for today.  A- Scheduled medications administered to patient, per MD orders. Support and encouragement provided. Routine safety checks conducted every 15 minutes. Patient informed to notify staff with problems or concerns.  R- No adverse drug reactions noted. Patient contracts for safety at this time. Patient compliant with medications and treatment plan. Patient receptive, calm, and cooperative. Patient interacts well with others on the unit. Patient remains safe at this time.

## 2023-03-13 NOTE — Progress Notes (Signed)
Patient ID: Kenneth [name], male   DOB: 1965/09/25, 57 y.o.   MRN: [id number]  Discharge Note:  Patient denies SI/HI/AVH at this time. Discharge instructions, AVS, prescriptions, and transition record gone over with patient.Patient refused to fill out his Suicide Safety Plan. Patient agrees to comply with medication management, follow-up visit, and outpatient therapy. Patient belongings returned to patient. Patient questions and concerns addressed and answered. Patient ambulatory off unit. Patient discharged to bus stop, via [employer] car.
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
## 2023-03-13 NOTE — Plan of Care (Signed)

## 2023-03-14 ENCOUNTER — Ambulatory Visit
Admission: EM | Admit: 2023-03-14 | Discharge: 2023-03-14 | Disposition: A | Discharge status: Home / Self Care | Payer: PRIVATE HEALTH INSURANCE | Attending: Family Medicine | Admitting: Family Medicine

## 2023-03-14 ENCOUNTER — Encounter: Payer: Self-pay | Admitting: Emergency Medicine

## 2023-03-14 DIAGNOSIS — R440 Auditory hallucinations: Secondary | ICD-10-CM | POA: Diagnosis not present

## 2023-03-14 DIAGNOSIS — R45850 Homicidal ideations: Secondary | ICD-10-CM

## 2023-03-14 DIAGNOSIS — Z8659 Personal history of other mental and behavioral disorders: Secondary | ICD-10-CM
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
## 2023-03-14 NOTE — ED Notes (Addendum)
Patient is being discharged from the Urgent Care and sent to the [facility] Emergency Department via [facility] police . Per Dr. Rachael Darby, patient is in need of higher level of care due to PTSD episode, auditory hallucination, and homicidal thoughts. Patient is aware and verbalizes understanding of plan of care.  Vitals:   03/14/23 1549  BP: (!) 166/95  Pulse: 85  Resp: 15  Temp: 97.9 F (36.6 C)  SpO2: 96%
